# Patient Record
Sex: Female | Born: 1975 | Race: Black or African American | Hispanic: No | Marital: Single | Smoking: Never smoker
Health system: Southern US, Community
[De-identification: ages and names within clinical notes are randomized; demographics above are authoritative.]

## PROBLEM LIST (undated history)

## (undated) DIAGNOSIS — G43909 Migraine, unspecified, not intractable, without status migrainosus: Secondary | ICD-10-CM

## (undated) DIAGNOSIS — F329 Major depressive disorder, single episode, unspecified: Secondary | ICD-10-CM

## (undated) DIAGNOSIS — L509 Urticaria, unspecified: Secondary | ICD-10-CM

## (undated) DIAGNOSIS — Z789 Other specified health status: Secondary | ICD-10-CM

## (undated) DIAGNOSIS — B9689 Other specified bacterial agents as the cause of diseases classified elsewhere: Secondary | ICD-10-CM

## (undated) DIAGNOSIS — F419 Anxiety disorder, unspecified: Secondary | ICD-10-CM

## (undated) DIAGNOSIS — Z8041 Family history of malignant neoplasm of ovary: Secondary | ICD-10-CM

## (undated) DIAGNOSIS — F32A Depression, unspecified: Secondary | ICD-10-CM

## (undated) DIAGNOSIS — J45909 Unspecified asthma, uncomplicated: Secondary | ICD-10-CM

## (undated) DIAGNOSIS — N390 Urinary tract infection, site not specified: Secondary | ICD-10-CM

## (undated) DIAGNOSIS — Z800 Family history of malignant neoplasm of digestive organs: Secondary | ICD-10-CM

## (undated) DIAGNOSIS — N760 Acute vaginitis: Secondary | ICD-10-CM

## (undated) HISTORY — DX: Anxiety disorder, unspecified: F41.9

## (undated) HISTORY — DX: Depression, unspecified: F32.A

## (undated) HISTORY — DX: Urinary tract infection, site not specified: N39.0

## (undated) HISTORY — DX: Other specified bacterial agents as the cause of diseases classified elsewhere: B96.89

## (undated) HISTORY — DX: Urticaria, unspecified: L50.9

## (undated) HISTORY — DX: Migraine, unspecified, not intractable, without status migrainosus: G43.909

## (undated) HISTORY — DX: Unspecified asthma, uncomplicated: J45.909

## (undated) HISTORY — PX: TUBAL LIGATION: SHX77

## (undated) HISTORY — DX: Other specified bacterial agents as the cause of diseases classified elsewhere: N76.0

## (undated) HISTORY — DX: Major depressive disorder, single episode, unspecified: F32.9

## (undated) HISTORY — DX: Family history of malignant neoplasm of digestive organs: Z80.0

## (undated) HISTORY — PX: ABLATION: SHX5711

## (undated) HISTORY — PX: APPENDECTOMY: SHX54

---
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
## 2007-04-23 ENCOUNTER — Emergency Department: Payer: Self-pay | Admitting: Emergency Medicine

## 2010-08-13 ENCOUNTER — Ambulatory Visit: Payer: Self-pay | Admitting: Family Medicine

## 2012-01-16 ENCOUNTER — Emergency Department: Payer: Self-pay | Admitting: Emergency Medicine

## 2012-08-17 ENCOUNTER — Emergency Department: Payer: Self-pay | Admitting: Emergency Medicine

## 2012-08-17 LAB — TROPONIN I: Troponin-I: <0.02 ng/mL

## 2012-08-17 LAB — COMPREHENSIVE METABOLIC PANEL
Albumin: 3.9 g/dL (ref 3.4–5.0)
Alkaline Phosphatase: 59 U/L (ref 50–136)
Bilirubin,Total: 1.1 mg/dL — ABNORMAL HIGH (ref 0.2–1.0)
Creatinine: 0.67 mg/dL (ref 0.60–1.30)
EGFR (Non-African Amer.): >60
Osmolality: 276 (ref 275–301)
Potassium: 4.6 mmol/L (ref 3.5–5.1)
SGOT(AST): 32 U/L (ref 15–37)
Sodium: 139 mmol/L (ref 136–145)

## 2012-08-17 LAB — CBC
MCH: 31.7 pg (ref 26.0–34.0)
MCHC: 33.3 g/dL (ref 32.0–36.0)
MCV: 95 fL (ref 80–100)
Platelet: 231 10*3/uL (ref 150–440)
RDW: 13.2 % (ref 11.5–14.5)
WBC: 5.8 10*3/uL (ref 3.6–11.0)

## 2012-08-17 LAB — CK TOTAL AND CKMB (NOT AT ARMC)
CK, Total: 177 U/L (ref 21–215)
CK-MB: <0.5 ng/mL — ABNORMAL LOW (ref 0.5–3.6)

## 2015-08-03 ENCOUNTER — Other Ambulatory Visit: Payer: Self-pay | Admitting: Medical

## 2015-08-03 ENCOUNTER — Ambulatory Visit
Admission: RE | Admit: 2015-08-03 | Discharge: 2015-08-03 | Disposition: A | Discharge status: Home / Self Care | Payer: BLUE CROSS/BLUE SHIELD | Source: Ambulatory Visit | Attending: Medical | Admitting: Medical

## 2015-08-03 DIAGNOSIS — R202 Paresthesia of skin: Secondary | ICD-10-CM | POA: Diagnosis present

## 2015-08-03 DIAGNOSIS — R52 Pain, unspecified: Secondary | ICD-10-CM

## 2015-08-03 DIAGNOSIS — M79605 Pain in left leg: Secondary | ICD-10-CM | POA: Insufficient documentation

## 2015-08-03 DIAGNOSIS — R200 Anesthesia of skin: Secondary | ICD-10-CM

## 2015-08-09 ENCOUNTER — Other Ambulatory Visit: Payer: Self-pay | Admitting: Medical

## 2015-08-09 DIAGNOSIS — M5432 Sciatica, left side: Secondary | ICD-10-CM

## 2015-08-09 DIAGNOSIS — R200 Anesthesia of skin: Secondary | ICD-10-CM

## 2015-08-09 DIAGNOSIS — R202 Paresthesia of skin: Secondary | ICD-10-CM

## 2015-08-11 ENCOUNTER — Emergency Department: Payer: BLUE CROSS/BLUE SHIELD

## 2015-08-11 ENCOUNTER — Emergency Department
Admission: EM | Admit: 2015-08-11 | Discharge: 2015-08-11 | Disposition: A | Discharge status: Home / Self Care | Payer: BLUE CROSS/BLUE SHIELD | Attending: Emergency Medicine | Admitting: Emergency Medicine

## 2015-08-11 ENCOUNTER — Encounter: Payer: Self-pay | Admitting: Emergency Medicine

## 2015-08-11 DIAGNOSIS — R1032 Left lower quadrant pain: Secondary | ICD-10-CM | POA: Insufficient documentation

## 2015-08-11 DIAGNOSIS — R109 Unspecified abdominal pain: Secondary | ICD-10-CM

## 2015-08-11 DIAGNOSIS — R1033 Periumbilical pain: Secondary | ICD-10-CM | POA: Diagnosis not present

## 2015-08-11 LAB — URINALYSIS COMPLETE WITH MICROSCOPIC (ARMC ONLY)
BILIRUBIN URINE: NEGATIVE
Bacteria, UA: NONE SEEN
GLUCOSE, UA: NEGATIVE mg/dL
HGB URINE DIPSTICK: NEGATIVE
Ketones, ur: NEGATIVE mg/dL
Leukocytes, UA: NEGATIVE
NITRITE: NEGATIVE
Protein, ur: NEGATIVE mg/dL
SPECIFIC GRAVITY, URINE: 1.02 (ref 1.005–1.030)
pH: 7 (ref 5.0–8.0)

## 2015-08-11 LAB — COMPREHENSIVE METABOLIC PANEL
ALBUMIN: 3.8 g/dL (ref 3.5–5.0)
ALK PHOS: 49 U/L (ref 38–126)
ALT: 18 U/L (ref 14–54)
AST: 23 U/L (ref 15–41)
Anion gap: 4 — ABNORMAL LOW (ref 5–15)
BILIRUBIN TOTAL: 0.9 mg/dL (ref 0.3–1.2)
BUN: 13 mg/dL (ref 6–20)
CALCIUM: 8.8 mg/dL — AB (ref 8.9–10.3)
CO2: 28 mmol/L (ref 22–32)
CREATININE: 0.68 mg/dL (ref 0.44–1.00)
Chloride: 106 mmol/L (ref 101–111)
GFR calc Af Amer: >60 mL/min (ref >60)
GFR calc non Af Amer: >60 mL/min (ref >60)
GLUCOSE: 87 mg/dL (ref 65–99)
Potassium: 4.5 mmol/L (ref 3.5–5.1)
Sodium: 138 mmol/L (ref 135–145)
TOTAL PROTEIN: 7.3 g/dL (ref 6.5–8.1)

## 2015-08-11 LAB — CBC
HCT: 37.5 % (ref 35.0–47.0)
Hemoglobin: 12.5 g/dL (ref 12.0–16.0)
MCH: 31.2 pg (ref 26.0–34.0)
MCHC: 33.4 g/dL (ref 32.0–36.0)
MCV: 93.2 fL (ref 80.0–100.0)
Platelets: 233 10*3/uL (ref 150–440)
RBC: 4.02 MIL/uL (ref 3.80–5.20)
RDW: 14.3 % (ref 11.5–14.5)
WBC: 6.2 10*3/uL (ref 3.6–11.0)

## 2015-08-11 LAB — PREGNANCY, URINE: PREG TEST UR: NEGATIVE

## 2015-08-11 LAB — LIPASE, BLOOD: Lipase: 16 U/L (ref 11–51)

## 2015-08-11 MED ORDER — ONDANSETRON 4 MG PO TBDP
4.0000 mg | ORAL_TABLET | Freq: Once | ORAL | Status: AC
  Administered 2015-08-11: 4 mg via ORAL
  Filled 2015-08-11: qty 1

## 2015-08-11 MED ORDER — ONDANSETRON HCL 4 MG PO TABS: 4.0000 mg | ORAL_TABLET | Freq: Three times a day (TID) | ORAL | Status: AC | PRN

## 2015-08-11 MED ORDER — HYDROCODONE-ACETAMINOPHEN 5-325 MG PO TABS
ORAL_TABLET | ORAL | Status: AC
  Filled 2015-08-11: qty 1

## 2015-08-11 MED ORDER — HYDROCODONE-ACETAMINOPHEN 5-325 MG PO TABS
1.0000 | ORAL_TABLET | Freq: Once | ORAL | Status: AC
  Administered 2015-08-11: 1 via ORAL

## 2015-08-11 MED ORDER — HYDROCODONE-ACETAMINOPHEN 5-325 MG PO TABS: 1.0000 | ORAL_TABLET | Freq: Four times a day (QID) | ORAL | Status: DC | PRN

## 2015-08-11 NOTE — ED Provider Notes (Signed)
[facility] Emergency Department Provider Note  ____________________________________________  Time seen: Approximately 11:14 AM  I have reviewed the triage vital signs and the nursing notes.   HISTORY  Chief Complaint Abdominal Pain    HPI Cassandra Pham is a 40 y.o. female , NAD, presents to the emergency department with sudden onset periumbilical and left lower quadrant abdominal pain over the last 2 days. Notes she's had pain in this area for approximately one year but has never been this bad. Denies nausea, vomiting, diarrhea, constipation. Has not had any fever, chills, sweats. Denies any chest pain or shortness of breath. Denies changes in urinary habits. No changes in physical activity nor dietary activity. Does know she has had her appendix removed as well as tubal ligation. No other abdominal surgeries. Denies any family history of abdominal issues other than her grandmother past with pancreatic cancer. She is being treated with pantoprazole for GERD by her primary care provider.   History reviewed. No pertinent past medical history.  There are no active problems to display for this patient.   History reviewed. No pertinent past surgical history.  Current Outpatient Rx  Name  Route  Sig  Dispense  Refill  . HYDROcodone-acetaminophen (NORCO) 5-325 MG tablet   Oral   Take 1 tablet by mouth every 6 (six) hours as needed for severe pain.   10 tablet   0   . ondansetron (ZOFRAN) 4 MG tablet   Oral   Take 1 tablet (4 mg total) by mouth every 8 (eight) hours as needed for nausea or vomiting.   10 tablet   0     Allergies Review of patient's allergies indicates no known allergies.  No family history on file.  Social History Social History  Substance Use Topics  . Smoking status: Never Smoker   . Smokeless tobacco: None  . Alcohol Use: No     Review of Systems  Constitutional: No fever/chills Cardiovascular: No chest pain. Respiratory:  No shortness of breath. No wheezing.  Gastrointestinal: Positive periumbilical, left lower quadrant abdominal pain.  No nausea, vomiting.  No diarrhea.  No constipation. Genitourinary: Negative for dysuria. No hematuria. No urinary hesitancy, urgency or increased frequency. Musculoskeletal: Negative for back pain.  Skin: Negative for rash. Neurological: Negative for headaches, focal weakness or numbness. 10-point ROS otherwise negative.  ____________________________________________   PHYSICAL EXAM:  VITAL SIGNS: ED Triage Vitals  Enc Vitals Group     BP --      Pulse Rate 08/11/15 0918 90     Resp 08/11/15 0918 20     Temp 08/11/15 0918 97.7 F (36.5 C)     Temp Source 08/11/15 0918 Oral     SpO2 08/11/15 0918 97 %     Weight 08/11/15 0918 112 lb (50.803 kg)     Height 08/11/15 0918 5' (1.524 m)     Head Cir --      Peak Flow --      Pain Score 08/11/15 0920 10     Pain Loc --      Pain Edu? --      Excl. in [place]? --     Constitutional: Alert and oriented. Well appearing and in no acute distress but in pain. Eyes: Conjunctivae are normal.  Head: Atraumatic. Neck: Supple with full range of motion. Hematological/Lymphatic/Immunilogical: No cervical lymphadenopathy. Cardiovascular: Normal rate, regular rhythm. Normal S1 and S2.  Good peripheral circulation. Respiratory: Normal respiratory effort without tachypnea or retractions. Lungs CTAB. Gastrointestinal:  Tender to light palpation about the area umbilicus and left lower quadrant regions but areas are soft. All other quadrants are soft and nontender to palpation. No distention but some guarding of the periumbilical area.  Neurologic:  Normal speech and language. No gross focal neurologic deficits are appreciated.  Skin:  Skin is warm, dry and intact. No rash noted. Psychiatric: Mood and affect are normal. Speech and behavior are normal. Patient exhibits appropriate insight and  judgement.   ____________________________________________   LABS (all labs ordered are listed, but only abnormal results are displayed)  Labs Reviewed  COMPREHENSIVE METABOLIC PANEL - Abnormal; Notable for the following:    Calcium 8.8 (*)    Anion gap 4 (*)    All other components within normal limits  URINALYSIS COMPLETEWITH MICROSCOPIC ([facility] ONLY) - Abnormal; Notable for the following:    Color, Urine YELLOW (*)    APPearance CLEAR (*)    Squamous Epithelial / LPF 0-5 (*)    All other components within normal limits  LIPASE, BLOOD  CBC  PREGNANCY, URINE   ____________________________________________  EKG  None ____________________________________________  RADIOLOGY I have personally viewed and evaluated these images (plain radiographs) as part of my medical decision making, as well as reviewing the written report by the radiologist.  US Transvaginal Non-ob  08/11/2015  CLINICAL DATA:  LEFT lower quadrant pain for 1 year worse for 1 week EXAM: TRANSABDOMINAL AND TRANSVAGINAL ULTRASOUND OF PELVIS TECHNIQUE: Both transabdominal and transvaginal ultrasound examinations of the pelvis were performed. Transabdominal technique was performed for global imaging of the pelvis including uterus, ovaries, adnexal regions, and pelvic cul-de-sac. It was necessary to proceed with endovaginal exam following the transabdominal exam to visualize the uterus and ovaries. COMPARISON:  None FINDINGS: Uterus Measurements: 8.4 x 3.8 x 5.8 cm. Posterior mid uterine leiomyoma slightly turned RIGHT, 2.8 x 1.5 x 2.2 cm. No additional masses. Endometrium Thickness: 12 mm thick. No endometrial fluid or focal abnormality. Few nabothian cysts at cervix. Right ovary Measurements: 3.3 x 2.3 x 2.3 cm. Dominant follicle 1.9 cm diameter. No additional mass. Internal blood flow present on color Doppler imaging. Left ovary Measurements: 2.8 x 1.9 x 1.7 cm. Normal morphology without mass. Internal blood flow present on  color Doppler imaging. Other findings Small amount of nonspecific free pelvic fluid. Numerous parametrial vessels in LEFT adnexa, nonspecific but can be seen with pelvic congestion syndrome. IMPRESSION: Posterior mid uterine fibroid 2.8 cm greatest size. Numerous parametrial vessels in LEFT adnexa, nonspecific but can be seen with pelvic congestion syndrome. Otherwise negative exam. Electronically Signed   By: Lavonia Dana M.D.   On: 08/11/2015 12:40   US Pelvis Complete  08/11/2015  CLINICAL DATA:  LEFT lower quadrant pain for 1 year worse for 1 week EXAM: TRANSABDOMINAL AND TRANSVAGINAL ULTRASOUND OF PELVIS TECHNIQUE: Both transabdominal and transvaginal ultrasound examinations of the pelvis were performed. Transabdominal technique was performed for global imaging of the pelvis including uterus, ovaries, adnexal regions, and pelvic cul-de-sac. It was necessary to proceed with endovaginal exam following the transabdominal exam to visualize the uterus and ovaries. COMPARISON:  None FINDINGS: Uterus Measurements: 8.4 x 3.8 x 5.8 cm. Posterior mid uterine leiomyoma slightly turned RIGHT, 2.8 x 1.5 x 2.2 cm. No additional masses. Endometrium Thickness: 12 mm thick. No endometrial fluid or focal abnormality. Few nabothian cysts at cervix. Right ovary Measurements: 3.3 x 2.3 x 2.3 cm. Dominant follicle 1.9 cm diameter. No additional mass. Internal blood flow present on color Doppler imaging. Left ovary Measurements:  2.8 x 1.9 x 1.7 cm. Normal morphology without mass. Internal blood flow present on color Doppler imaging. Other findings Small amount of nonspecific free pelvic fluid. Numerous parametrial vessels in LEFT adnexa, nonspecific but can be seen with pelvic congestion syndrome. IMPRESSION: Posterior mid uterine fibroid 2.8 cm greatest size. Numerous parametrial vessels in LEFT adnexa, nonspecific but can be seen with pelvic congestion syndrome. Otherwise negative exam. Electronically Signed   By: Lavonia Dana  M.D.   On: 08/11/2015 12:40   US Abdomen Limited  08/11/2015  CLINICAL DATA:  Chronic left lower quadrant abdominal pain. EXAM: LIMITED ABDOMINAL ULTRASOUND COMPARISON:  None. FINDINGS: Sonographic evaluation was performed in the left lower quadrant of the abdomen. No definite mass or cyst is noted. IMPRESSION: No definite sonographic abnormality seen in the left lower quadrant of the abdomen. Electronically Signed   By: Marijo Conception, M.D.   On: 08/11/2015 12:36   Dg Abd Acute W/chest  08/11/2015  CLINICAL DATA:  Worsening left-sided abdominal pain beginning over 1 year ago but increased this week with feelings of syncope. Pain localized to the umbilicus and left side. EXAM: DG ABDOMEN ACUTE W/ 1V CHEST COMPARISON:  Chest x-ray dated 08/17/2012. FINDINGS: Single view of the chest: Heart size is normal. Lungs are clear. Lung volumes are normal. Osseous structures about the chest are unremarkable. Supine and upright views of the abdomen: Bowel gas pattern is nonobstructive. No evidence of soft tissue mass or abnormal fluid collection. No evidence of free intraperitoneal air. No pathologic- appearing calcifications. Osseous structures are unremarkable. IMPRESSION: 1. Lungs are clear and there is no evidence of acute cardiopulmonary abnormality. 2. Nonobstructive bowel gas pattern and no evidence of acute intra-abdominal abnormality. Electronically Signed   By: Franki Cabot M.D.   On: 08/11/2015 12:58    ____________________________________________    PROCEDURES  Procedure(s) performed: None   Medications  HYDROcodone-acetaminophen (NORCO/VICODIN) 5-325 MG per tablet 1 tablet (1 tablet Oral Given 08/11/15 1120)  ondansetron (ZOFRAN-ODT) disintegrating tablet 4 mg (4 mg Oral Given 08/11/15 1321)   Patient notes improvement of abdominal pain, but had some nausea begin. Zofran was given prior to discharge.   ____________________________________________   INITIAL IMPRESSION / ASSESSMENT AND PLAN  / ED COURSE  Pertinent labs & imaging results that were available during my care of the patient were reviewed by me and considered in my medical decision making (see chart for details).  Patient's diagnosis is consistent with generalized abdominal pain. Labs and imaging without significant abnormalities with stable, normal vital signs is reassuring. Patient will be discharged home with prescriptions for Norco and Zofran to take as directed. Patient is to follow up with Dr. Candace Cruise in gastroenterology as soon as possible to continue evaluation and treatment.  Patient is given ED precautions to return to the ED for any worsening or new symptoms.    ____________________________________________  FINAL CLINICAL IMPRESSION(S) / ED DIAGNOSES  Final diagnoses:  Abdominal pain, unspecified abdominal location      NEW MEDICATIONS STARTED DURING THIS VISIT:  New Prescriptions   HYDROCODONE-ACETAMINOPHEN (NORCO) 5-325 MG TABLET    Take 1 tablet by mouth every 6 (six) hours as needed for severe pain.   ONDANSETRON (ZOFRAN) 4 MG TABLET    Take 1 tablet (4 mg total) by mouth every 8 (eight) hours as needed for nausea or vomiting.         Braxton Feathers, PA-C 08/11/15 [place], MD 08/11/15 1328

## 2015-08-11 NOTE — ED Notes (Signed)
Pt presents with llq pain for about one year now.

## 2015-08-11 NOTE — Discharge Instructions (Signed)

## 2015-08-11 NOTE — ED Notes (Signed)
Patient transported to Ultrasound 

## 2015-08-17 ENCOUNTER — Other Ambulatory Visit: Payer: BLUE CROSS/BLUE SHIELD

## 2015-08-22 ENCOUNTER — Ambulatory Visit
Admission: RE | Admit: 2015-08-22 | Discharge: 2015-08-22 | Disposition: A | Discharge status: Home / Self Care | Payer: BLUE CROSS/BLUE SHIELD | Source: Ambulatory Visit | Attending: Medical | Admitting: Medical

## 2015-08-22 DIAGNOSIS — R202 Paresthesia of skin: Secondary | ICD-10-CM

## 2015-08-22 DIAGNOSIS — M5432 Sciatica, left side: Secondary | ICD-10-CM

## 2015-08-22 DIAGNOSIS — R200 Anesthesia of skin: Secondary | ICD-10-CM

## 2015-10-05 ENCOUNTER — Ambulatory Visit: Payer: BLUE CROSS/BLUE SHIELD | Admitting: Certified Registered"

## 2015-10-05 ENCOUNTER — Encounter: Payer: Self-pay | Admitting: *Deleted

## 2015-10-05 ENCOUNTER — Encounter
Admission: RE | Disposition: A | Discharge status: Home / Self Care | Payer: Self-pay | Source: Ambulatory Visit | Attending: Gastroenterology

## 2015-10-05 ENCOUNTER — Ambulatory Visit
Admission: RE | Admit: 2015-10-05 | Discharge: 2015-10-05 | Disposition: A | Discharge status: Home / Self Care | Payer: BLUE CROSS/BLUE SHIELD | Source: Ambulatory Visit | Attending: Gastroenterology | Admitting: Gastroenterology

## 2015-10-05 DIAGNOSIS — R1013 Epigastric pain: Secondary | ICD-10-CM | POA: Insufficient documentation

## 2015-10-05 DIAGNOSIS — Z79891 Long term (current) use of opiate analgesic: Secondary | ICD-10-CM | POA: Insufficient documentation

## 2015-10-05 DIAGNOSIS — K3189 Other diseases of stomach and duodenum: Secondary | ICD-10-CM | POA: Diagnosis not present

## 2015-10-05 DIAGNOSIS — R109 Unspecified abdominal pain: Secondary | ICD-10-CM | POA: Diagnosis not present

## 2015-10-05 HISTORY — DX: Other specified health status: Z78.9

## 2015-10-05 HISTORY — PX: ESOPHAGOGASTRODUODENOSCOPY (EGD) WITH PROPOFOL: SHX5813

## 2015-10-05 LAB — POCT PREGNANCY, URINE: PREG TEST UR: NEGATIVE

## 2015-10-05 SURGERY — ESOPHAGOGASTRODUODENOSCOPY (EGD) WITH PROPOFOL
Anesthesia: General

## 2015-10-05 MED ORDER — SODIUM CHLORIDE 0.9 % IV SOLN
INTRAVENOUS | Status: DC
  Administered 2015-10-05: 10:00:00 via INTRAVENOUS

## 2015-10-05 MED ORDER — GLYCOPYRROLATE 0.2 MG/ML IJ SOLN
INTRAMUSCULAR | Status: DC | PRN
  Administered 2015-10-05: 0.2 mg via INTRAVENOUS

## 2015-10-05 MED ORDER — PROPOFOL 500 MG/50ML IV EMUL
INTRAVENOUS | Status: DC | PRN
  Administered 2015-10-05: 120 ug/kg/min via INTRAVENOUS

## 2015-10-05 MED ORDER — PROPOFOL 10 MG/ML IV BOLUS
INTRAVENOUS | Status: DC | PRN
  Administered 2015-10-05: 70 mg via INTRAVENOUS

## 2015-10-05 MED ORDER — LIDOCAINE HCL (CARDIAC) 20 MG/ML IV SOLN
INTRAVENOUS | Status: DC | PRN
  Administered 2015-10-05: 50 mg via INTRAVENOUS

## 2015-10-05 NOTE — Anesthesia Postprocedure Evaluation (Signed)
Anesthesia Post Note  Patient: Cassandra Pham  Procedure(s) Performed: Procedure(s) (LRB): ESOPHAGOGASTRODUODENOSCOPY (EGD) WITH PROPOFOL (N/A)  Patient location during evaluation: Endoscopy Anesthesia Type: General Level of consciousness: awake and alert Pain management: pain level controlled Vital Signs Assessment: post-procedure vital signs reviewed and stable Respiratory status: spontaneous breathing, nonlabored ventilation, respiratory function stable and patient connected to nasal cannula oxygen Cardiovascular status: blood pressure returned to baseline and stable Postop Assessment: no signs of nausea or vomiting Anesthetic complications: no    Last Vitals:  Filed Vitals:   10/05/15 1033 10/05/15 1035  BP: 90/61 99/61  Pulse: 92 90  Temp: 36.1 C 36.1 C  Resp: 16 20    Last Pain: There were no vitals filed for this visit.               Martha Clan

## 2015-10-05 NOTE — H&P (Signed)
    Primary Care Physician:  Estill Dooms RATCLIFFE, PA-C Primary Gastroenterologist:  Dr. Candace Cruise  Pre-Procedure History & Physical: HPI:  Cassandra Pham is a 40 y.o. female is here for an EGD.   Past Medical History  Diagnosis Date  . Medical history non-contributory     Past Surgical History  Procedure Laterality Date  . Tubal ligation    . Appendectomy      Prior to Admission medications   Medication Sig Start Date End Date Taking? Authorizing Provider  HYDROcodone-acetaminophen (NORCO) 5-325 MG tablet Take 1 tablet by mouth every 6 (six) hours as needed for severe pain. 08/11/15   Jami L Hagler, PA-C    Allergies as of 09/25/2015  . (No Known Allergies)    History reviewed. No pertinent family history.  Social History   Social History  . Marital Status: Married    Spouse Name: N/A  . Number of Children: N/A  . Years of Education: N/A   Occupational History  . Not on file.   Social History Main Topics  . Smoking status: Never Smoker   . Smokeless tobacco: Never Used  . Alcohol Use: No  . Drug Use: No  . Sexual Activity: Not on file   Other Topics Concern  . Not on file   Social History Narrative    Review of Systems: See HPI, otherwise negative ROS  Physical Exam: BP 103/67 mmHg  Pulse 77  Temp(Src) 97.4 F (36.3 C) (Tympanic)  Resp 18  Ht 5' (1.524 m)  Wt 112 lb (50.803 kg)  BMI 21.87 kg/m2  SpO2 100% General:   Alert,  pleasant and cooperative in NAD Head:  Normocephalic and atraumatic. Neck:  Supple; no masses or thyromegaly. Lungs:  Clear throughout to auscultation.    Heart:  Regular rate and rhythm. Abdomen:  Soft, nontender and nondistended. Normal bowel sounds, without guarding, and without rebound.   Neurologic:  Alert and  oriented x4;  grossly normal neurologically.  Impression/Plan: Cassandra Pham is here for an EGD to be performed for epigastric pain.  Risks, benefits, limitations, and alternatives regarding EGD have been reviewed  with the patient.  Questions have been answered.  All parties agreeable.   [name], Lupita Dawn, MD  10/05/2015, 10:11 AM

## 2015-10-05 NOTE — Transfer of Care (Signed)
Immediate Anesthesia Transfer of Care Note  Patient: Cassandra Pham  Procedure(s) Performed: Procedure(s): ESOPHAGOGASTRODUODENOSCOPY (EGD) WITH PROPOFOL (N/A)  Patient Location: Endoscopy Unit  Anesthesia Type:General  Level of Consciousness: awake  Airway & Oxygen Therapy: Patient Spontanous Breathing and Patient connected to face mask oxygen  Post-op Assessment: Report given to RN  Post vital signs: Reviewed  Last Vitals:  Filed Vitals:   10/05/15 0959 10/05/15 1035  BP: 103/67 99/61  Pulse: 77 90  Temp: 36.3 C 36.1 C  Resp: 18 20    Last Pain: There were no vitals filed for this visit.       Complications: No apparent anesthesia complications

## 2015-10-05 NOTE — Op Note (Signed)
[facility] Gastroenterology Patient Name: Cassandra Pham Procedure Date: 10/05/2015 10:22 AM MRN: [id number] Account #: [id number] Date of Birth: 1975-09-08 Admit Type: Outpatient Age: 40 Room: [facility] ENDO ROOM 3 Gender: Female Note Status: Finalized Procedure:            Upper GI endoscopy Indications:          Epigastric abdominal pain Providers:            Lupita Dawn. Candace Cruise, MD Referring MD:         Estill Dooms Lily Peer, MD (Referring MD) Medicines:            Monitored Anesthesia Care Complications:        No immediate complications. Procedure:            Pre-Anesthesia Assessment:                       - Prior to the procedure, a History and Physical was                        performed, and patient medications, allergies and                        sensitivities were reviewed. The patient's tolerance of                        previous anesthesia was reviewed.                       - The risks and benefits of the procedure and the                        sedation options and risks were discussed with the                        patient. All questions were answered and informed                        consent was obtained.                       - After reviewing the risks and benefits, the patient                        was deemed in satisfactory condition to undergo the                        procedure.                       After obtaining informed consent, the endoscope was                        passed under direct vision. Throughout the procedure,                        the patient's blood pressure, pulse, and oxygen                        saturations were monitored continuously. The Endoscope  was introduced through the mouth, and advanced to the                        second part of duodenum. The upper GI endoscopy was                        accomplished without difficulty. The patient tolerated                        the procedure  well. Findings:      The examined esophagus was normal.      The entire examined stomach was normal.      The examined duodenum was normal. Impression:           - Normal esophagus.                       - Normal stomach.                       - Normal examined duodenum.                       - No specimens collected. Recommendation:       - Discharge patient to home.                       - Observe patient's clinical course.                       - Continue present medications.                       - Await pathology results.                       - The findings and recommendations were discussed with                        the patient. Procedure Code(s):    --- Professional ---                       [phone], Esophagogastroduodenoscopy, flexible, transoral;                        diagnostic, including collection of specimen(s) by                        brushing or washing, when performed (separate procedure) Diagnosis Code(s):    --- Professional ---                       R10.13, Epigastric pain CPT copyright 2016 American Medical Association. All rights reserved. The codes documented in this report are preliminary and upon coder review may  be revised to meet current compliance requirements. Hulen Luster, MD 10/05/2015 10:31:33 AM This report has been signed electronically. Number of Addenda: 0 Note Initiated On: 10/05/2015 10:22 AM      [facility]

## 2015-10-05 NOTE — Anesthesia Preprocedure Evaluation (Signed)
Anesthesia Evaluation  Patient identified by MRN, date of birth, ID band Patient awake    Reviewed: Allergy & Precautions, H&P , NPO status , Patient's Chart, lab work & pertinent test results, reviewed documented beta blocker date and time   History of Anesthesia Complications Negative for: history of anesthetic complications  Airway Mallampati: I  TM Distance: >3 FB Neck ROM: full    Dental no notable dental hx. (+) Teeth Intact, Missing   Pulmonary neg pulmonary ROS,    Pulmonary exam normal breath sounds clear to auscultation       Cardiovascular Exercise Tolerance: Good negative cardio ROS Normal cardiovascular exam Rhythm:regular Rate:Normal     Neuro/Psych negative neurological ROS  negative psych ROS   GI/Hepatic negative GI ROS, Neg liver ROS,   Endo/Other  negative endocrine ROS  Renal/GU negative Renal ROS  negative genitourinary   Musculoskeletal   Abdominal   Peds  Hematology negative hematology ROS (+)   Anesthesia Other Findings Past Medical History:   Medical history non-contributory                             Reproductive/Obstetrics negative OB ROS                             Anesthesia Physical Anesthesia Plan  ASA: I  Anesthesia Plan: General   Post-op Pain Management:    Induction:   Airway Management Planned:   Additional Equipment:   Intra-op Plan:   Post-operative Plan:   Informed Consent: I have reviewed the patients History and Physical, chart, labs and discussed the procedure including the risks, benefits and alternatives for the proposed anesthesia with the patient or authorized representative who has indicated his/her understanding and acceptance.   Dental Advisory Given  Plan Discussed with: Anesthesiologist, CRNA and Surgeon  Anesthesia Plan Comments:         Anesthesia Quick Evaluation

## 2015-10-06 LAB — SURGICAL PATHOLOGY

## 2015-10-07 ENCOUNTER — Encounter: Payer: Self-pay | Admitting: Gastroenterology

## 2016-02-05 DIAGNOSIS — Z1151 Encounter for screening for human papillomavirus (HPV): Secondary | ICD-10-CM | POA: Diagnosis not present

## 2016-02-05 DIAGNOSIS — Z1239 Encounter for other screening for malignant neoplasm of breast: Secondary | ICD-10-CM | POA: Diagnosis not present

## 2016-02-05 DIAGNOSIS — Z124 Encounter for screening for malignant neoplasm of cervix: Secondary | ICD-10-CM | POA: Diagnosis not present

## 2016-02-05 DIAGNOSIS — Z01419 Encounter for gynecological examination (general) (routine) without abnormal findings: Secondary | ICD-10-CM | POA: Diagnosis not present

## 2016-02-12 DIAGNOSIS — J029 Acute pharyngitis, unspecified: Secondary | ICD-10-CM | POA: Diagnosis not present

## 2016-03-01 DIAGNOSIS — N390 Urinary tract infection, site not specified: Secondary | ICD-10-CM | POA: Diagnosis not present

## 2016-03-01 DIAGNOSIS — S060X0A Concussion without loss of consciousness, initial encounter: Secondary | ICD-10-CM | POA: Diagnosis not present

## 2016-09-04 ENCOUNTER — Other Ambulatory Visit: Payer: Self-pay | Admitting: Obstetrics and Gynecology

## 2016-09-04 DIAGNOSIS — Z1231 Encounter for screening mammogram for malignant neoplasm of breast: Secondary | ICD-10-CM

## 2016-09-09 ENCOUNTER — Other Ambulatory Visit: Payer: Self-pay | Admitting: Obstetrics and Gynecology

## 2016-09-09 DIAGNOSIS — Z1231 Encounter for screening mammogram for malignant neoplasm of breast: Secondary | ICD-10-CM

## 2016-09-11 ENCOUNTER — Ambulatory Visit
Admission: RE | Admit: 2016-09-11 | Discharge: 2016-09-11 | Disposition: A | Discharge status: Home / Self Care | Payer: BLUE CROSS/BLUE SHIELD | Source: Ambulatory Visit | Attending: Obstetrics and Gynecology | Admitting: Obstetrics and Gynecology

## 2016-09-11 DIAGNOSIS — Z1231 Encounter for screening mammogram for malignant neoplasm of breast: Secondary | ICD-10-CM | POA: Insufficient documentation

## 2016-09-12 ENCOUNTER — Encounter: Payer: Self-pay | Admitting: Obstetrics and Gynecology

## 2016-09-17 ENCOUNTER — Ambulatory Visit (INDEPENDENT_AMBULATORY_CARE_PROVIDER_SITE_OTHER): Payer: BLUE CROSS/BLUE SHIELD | Admitting: Obstetrics and Gynecology

## 2016-09-17 ENCOUNTER — Encounter: Payer: Self-pay | Admitting: Obstetrics and Gynecology

## 2016-09-17 VITALS — BP 108/68 | HR 79 | Ht 60.0 in | Wt 114.0 lb

## 2016-09-17 DIAGNOSIS — N898 Other specified noninflammatory disorders of vagina: Secondary | ICD-10-CM

## 2016-09-17 DIAGNOSIS — B9689 Other specified bacterial agents as the cause of diseases classified elsewhere: Secondary | ICD-10-CM | POA: Diagnosis not present

## 2016-09-17 DIAGNOSIS — N760 Acute vaginitis: Secondary | ICD-10-CM

## 2016-09-17 LAB — POCT WET PREP WITH KOH
Clue Cells Wet Prep HPF POC: POSITIVE
KOH PREP POC: POSITIVE — AB
TRICHOMONAS UA: NEGATIVE
Yeast Wet Prep HPF POC: NEGATIVE

## 2016-09-17 MED ORDER — METRONIDAZOLE 0.75 % EX GEL: CUTANEOUS | 0 refills | Status: DC

## 2016-09-17 MED ORDER — METRONIDAZOLE 500 MG PO TABS: 500.0000 mg | ORAL_TABLET | Freq: Two times a day (BID) | ORAL | 0 refills | Status: DC

## 2016-09-17 NOTE — Progress Notes (Signed)
Chief Complaint  Patient presents with  . Vaginal Discharge    HPI:      [name] is a 41 y.o. [id number] who LMP was Patient's last menstrual period was 09/06/2016., presents today for vaginal d/c with odor, no irritation for the past 2-3 months. She has a hx of BV in the past, but when she last had sx 9/17, wet prep was neg and pt didn't have odor. She has also noted some mild LBP. No fevers, chills, belly pain. No new partners.    Review of Systems  Constitutional: Negative for fever.  Gastrointestinal: Negative for blood in stool, constipation, diarrhea, nausea and vomiting.  Genitourinary: Positive for vaginal discharge. Negative for dyspareunia, dysuria, flank pain, frequency, hematuria, urgency, vaginal bleeding and vaginal pain.  Musculoskeletal: Negative for back pain.  Skin: Negative for rash.    There are no active problems to display for this patient.   Family History  Problem Relation Age of Onset  . Hypertension Mother   . Diabetes Father   . Pancreatic cancer Maternal Grandmother 98  . Breast cancer Neg Hx     Social History   Social History  . Marital status: Married    Spouse name: N/A  . Number of children: N/A  . Years of education: N/A   Occupational History  . Not on file.   Social History Main Topics  . Smoking status: Never Smoker  . Smokeless tobacco: Never Used  . Alcohol use No  . Drug use: No  . Sexual activity: Yes    Birth control/ protection: Surgical   Other Topics Concern  . Not on file   Social History Narrative  . No narrative on file     Current Outpatient Prescriptions:  .  Cholecalciferol (VITAMIN D-1000 MAX ST) 1000 units tablet, Take by mouth., Disp: , Rfl:  .  metroNIDAZOLE (METROGEL) 5.05 % gel, 1 applicatorful inserted vaginally nightly for 5 nights, Disp: 45 g, Rfl: 0  OBJECTIVE:   Vitals:  BP 108/68   Pulse 79   Ht 5' (1.524 m)   Wt 114 lb (51.7 kg)   LMP 09/06/2016   BMI 22.26 kg/m   Physical  Exam  Constitutional: She is oriented to person, place, and time and well-developed, well-nourished, and in no distress. Vital signs are normal.  Genitourinary: Uterus normal, cervix normal, right adnexa normal, left adnexa normal and vulva normal. Uterus is not enlarged. Cervix exhibits no motion tenderness and no tenderness. Right adnexum displays no mass and no tenderness. Left adnexum displays no mass and no tenderness. Vulva exhibits no erythema, no exudate, no lesion, no rash and no tenderness. Vagina exhibits no lesion. Creamy  white and vaginal discharge found.  Neurological: She is oriented to person, place, and time.  Vitals reviewed.   Results: Results for orders placed or performed in visit on 09/17/16  POCT Wet Prep with KOH  Result Value Ref Range   Trichomonas, UA Negative    Clue Cells Wet Prep HPF POC pos    Epithelial Wet Prep HPF POC  Few, Moderate, Many, Too numerous to count   Yeast Wet Prep HPF POC neg    Bacteria Wet Prep HPF POC  Few   RBC Wet Prep HPF POC     WBC Wet Prep HPF POC     KOH Prep POC Positive (A) Negative      Assessment/Plan: Bacterial vaginosis - Rx metrogel (pt pref--not pills). Will RF if sx recur. F/u prn. -  Plan: POCT Wet Prep with KOH, metroNIDAZOLE (METROGEL) 0.75 % gel, DISCONTINUED: metroNIDAZOLE (FLAGYL) 500 MG tablet  Vaginal discharge - Plan: POCT Wet Prep with KOH, DISCONTINUED: metroNIDAZOLE (FLAGYL) 500 MG tablet   Return if symptoms worsen or fail to improve.  Alicia B. Copland, PA-C 09/17/2016 2:12 PM

## 2016-09-20 ENCOUNTER — Encounter: Payer: Self-pay | Admitting: Medical

## 2016-09-20 ENCOUNTER — Ambulatory Visit: Payer: BLUE CROSS/BLUE SHIELD | Admitting: Medical

## 2016-09-20 VITALS — BP 118/74 | HR 96 | Temp 97.9°F | Resp 16

## 2016-09-20 DIAGNOSIS — N3001 Acute cystitis with hematuria: Secondary | ICD-10-CM

## 2016-09-20 LAB — POCT URINALYSIS DIPSTICK
Bilirubin, UA: NEGATIVE
Glucose, UA: NEGATIVE
KETONES UA: NEGATIVE
Nitrite, UA: NEGATIVE
PH UA: 6 (ref 5.0–8.0)
PROTEIN UA: NEGATIVE
SPEC GRAV UA: 1.02 (ref 1.010–1.025)
UROBILINOGEN UA: NEGATIVE U/dL — AB

## 2016-09-20 MED ORDER — NITROFURANTOIN MONOHYD MACRO 100 MG PO CAPS: 100.0000 mg | ORAL_CAPSULE | Freq: Two times a day (BID) | ORAL | 0 refills | Status: DC

## 2016-09-20 NOTE — Progress Notes (Signed)
Patient reports UTI symptoms since yesterday.  States she has pressure and is using the bathroom frequently but only voids a small amount.

## 2016-09-20 NOTE — Addendum Note (Signed)
Addended by: Sharl Ma on: 09/20/2016 12:59 PM   Modules accepted: Orders

## 2016-09-20 NOTE — Patient Instructions (Signed)
Drink plenty of water. Take macrobid  Twice daily x 7 days. Use OTC AZO take as directed for discomfort. OTC Motrin or Tylenol take as directed for back pain.

## 2016-09-20 NOTE — Progress Notes (Signed)
   Subjective:    Patient ID: Cassandra Pham, female    DOB: [birth year], 41 y.o.   MRN: [id number]  HPI 41 yo female , started yesterday with  lower bilateral back pain ( usually happens with urinary tract infections) , frequency , little amounts of voiding , pain on urination, and  suprapubic pain. Currently being treated for bacterial vaginosis saw Fraser Din using  vaginal metronidazole gel.   Review of Systems  Constitutional: Negative for chills and fever.  HENT: Negative.   Eyes: Negative.   Respiratory: Negative.   Cardiovascular: Negative.   Gastrointestinal: Negative.   Endocrine: Negative.   Genitourinary: Positive for decreased urine volume, dysuria, frequency and urgency. Negative for hematuria and vaginal bleeding.  Musculoskeletal: Positive for back pain. Negative for arthralgias and neck pain.  Allergic/Immunologic: Negative for environmental allergies, food allergies and immunocompromised state.  Neurological: Negative for dizziness, syncope and light-headedness.  Hematological: Negative.   Psychiatric/Behavioral: Negative for agitation, confusion, hallucinations and self-injury.       Objective:   Physical Exam  Constitutional: She is oriented to person, place, and time. She appears well-developed and well-nourished.  Eyes: EOM are normal. Pupils are equal, round, and reactive to light.  Neck: Normal range of motion.  Abdominal: Soft. Bowel sounds are normal. There is no tenderness.  Neurological: She is alert and oriented to person, place, and time.  Skin: Skin is warm and dry.  Psychiatric: She has a normal mood and affect. Her behavior is normal.   CVA tenderness mild bilateral. ( both equally painful per patient).       Assessment & Plan:   Urinary tract infection eprescribed  macrobid 100mg  twice daily x 7 days  #14 no refills. ( she did not like the cipro pill due to its size).  Recommended OTC Azo take as directed x  2-3 daya then stop.  Given  Azo( reviewed with patient this will cause urine to turn orange.) [id number] exp 5/19 and Advil in clinic 44784 1/20 Return to clinic in 3 days if not improving.  If fever, back pain, vomiting or chills to seek out medical care at an Urgent Care over the weekend. Increase water intake.

## 2016-10-02 ENCOUNTER — Ambulatory Visit: Payer: BLUE CROSS/BLUE SHIELD | Admitting: Medical

## 2016-10-02 VITALS — BP 110/75 | HR 78 | Temp 98.3°F | Resp 16 | Ht 60.0 in | Wt 115.0 lb

## 2016-10-02 DIAGNOSIS — M10472 Other secondary gout, left ankle and foot: Secondary | ICD-10-CM

## 2016-10-02 DIAGNOSIS — M109 Gout, unspecified: Secondary | ICD-10-CM | POA: Insufficient documentation

## 2016-10-02 MED ORDER — PREDNISONE 10 MG (21) PO TBPK: ORAL_TABLET | ORAL | 0 refills | Status: DC

## 2016-10-02 NOTE — Patient Instructions (Addendum)
Prednisone taper  10mg  take  6 pill today by mouth then  5 pills tomorrow then one less everyday thereafter till gone. Take with food. Elevate foot  Return if not improving or worsening .

## 2016-10-02 NOTE — Progress Notes (Addendum)
   Subjective:    Patient ID: Cassandra Pham, female    DOB: 1975-11-29, 41 y.o.   MRN: [id number]  HPI   41 yo female started on Monday night with pain left great toe, Previous history of Gout ( last episode one year ago).  Eating shrimp plate and Fish and alcohol tequila and vodka for a birthday.Ate more shrimp both on last Wednesday and Friday. Took advil  At  11:30am  400mg  to day with no relief.  Is a Administrator for student conduct.  Review of Systems  Constitutional: Negative for chills and fever.  HENT: Negative for congestion, ear discharge and sore throat.   Eyes: Negative for discharge and itching.  Respiratory: Negative for chest tightness, shortness of breath and wheezing.   Cardiovascular: Negative for chest pain, palpitations and leg swelling.  Gastrointestinal: Negative for diarrhea, nausea and vomiting.  Endocrine: Negative for cold intolerance and heat intolerance.  Genitourinary: Positive for hematuria. Negative for dysuria.  Musculoskeletal: Negative for arthralgias and myalgias.  Allergic/Immunologic: Positive for environmental allergies and food allergies.   Pt with hematuria, on her mensis.    Objective:   Physical Exam  Constitutional: She is oriented to person, place, and time. She appears well-developed and well-nourished.  HENT:  Head: Normocephalic and atraumatic.  Eyes: EOM are normal. Pupils are equal, round, and reactive to light.  Musculoskeletal: Normal range of motion.  Neurological: She is alert and oriented to person, place, and time.  Skin: There is erythema.  Psychiatric: She has a normal mood and affect. Her behavior is normal.   Red and swollen left  First metatarsal joint , tender to palpation. Patient walking gingerly secondary to pain.       Assessment & Plan:  Gout Prednisone taper  10mg  take  6 pill today by mouth then  5 pills tomorrow then one less everyday thereafter till gone , take with food. Wear comfortable  shoes. Elevate foot , work note given to do so.  OTC Advil 600mg  every 6 hours with food for pain no more than 5-7 days. Reviewed foods that can cause gout. She though since she had not had an attack in so long that she was safe to eat shrimp. Reviewed Gout and uric acid information with patient. Return to the clinic for annual fasting labs scheduled for Tuesday 5/15/ 2018. Will also check uric acid level.

## 2016-10-04 ENCOUNTER — Other Ambulatory Visit: Payer: BLUE CROSS/BLUE SHIELD

## 2016-10-09 ENCOUNTER — Other Ambulatory Visit: Payer: BLUE CROSS/BLUE SHIELD

## 2016-10-09 DIAGNOSIS — Z0000 Encounter for general adult medical examination without abnormal findings: Secondary | ICD-10-CM

## 2016-10-10 LAB — CMP12+LP+TP+TSH+6AC+CBC/D/PLT
A/G RATIO: 1.3 (ref 1.2–2.2)
ALK PHOS: 48 IU/L (ref 39–117)
ALT: 11 IU/L (ref 0–32)
AST: 15 IU/L (ref 0–40)
Albumin: 3.9 g/dL (ref 3.5–5.5)
BASOS ABS: 0 10*3/uL (ref 0.0–0.2)
BASOS: 0 %
BILIRUBIN TOTAL: 0.5 mg/dL (ref 0.0–1.2)
BUN / CREAT RATIO: 13 (ref 9–23)
BUN: 10 mg/dL (ref 6–24)
CHOL/HDL RATIO: 4.2 ratio (ref 0.0–4.4)
CREATININE: 0.78 mg/dL (ref 0.57–1.00)
Calcium: 8.8 mg/dL (ref 8.7–10.2)
Chloride: 103 mmol/L (ref 96–106)
Cholesterol, Total: 198 mg/dL (ref 100–199)
EOS (ABSOLUTE): 0.4 10*3/uL (ref 0.0–0.4)
EOS: 9 %
ESTIMATED CHD RISK: 1 times avg. (ref 0.0–1.0)
Free Thyroxine Index: 1.9 (ref 1.2–4.9)
GFR, EST AFRICAN AMERICAN: 108 mL/min/{1.73_m2} (ref >59)
GFR, EST NON AFRICAN AMERICAN: 93 mL/min/{1.73_m2} (ref >59)
GGT: 34 IU/L (ref 0–60)
GLUCOSE: 86 mg/dL (ref 65–99)
Globulin, Total: 2.9 g/dL (ref 1.5–4.5)
HDL: 47 mg/dL (ref >39)
HEMATOCRIT: 37.3 % (ref 34.0–46.6)
HEMOGLOBIN: 12.1 g/dL (ref 11.1–15.9)
IMMATURE GRANULOCYTES: 0 %
IRON: 63 ug/dL (ref 27–159)
Immature Grans (Abs): 0 10*3/uL (ref 0.0–0.1)
LDH: 145 IU/L (ref 119–226)
LDL CALC: 138 mg/dL — AB (ref 0–99)
LYMPHS ABS: 1.8 10*3/uL (ref 0.7–3.1)
Lymphs: 40 %
MCH: 30.2 pg (ref 26.6–33.0)
MCHC: 32.4 g/dL (ref 31.5–35.7)
MCV: 93 fL (ref 79–97)
MONOCYTES: 7 %
Monocytes Absolute: 0.3 10*3/uL (ref 0.1–0.9)
Neutrophils Absolute: 2 10*3/uL (ref 1.4–7.0)
Neutrophils: 44 %
PLATELETS: 302 10*3/uL (ref 150–379)
Phosphorus: 3.1 mg/dL (ref 2.5–4.5)
Potassium: 4.3 mmol/L (ref 3.5–5.2)
RBC: 4.01 x10E6/uL (ref 3.77–5.28)
RDW: 14 % (ref 12.3–15.4)
SODIUM: 140 mmol/L (ref 134–144)
T3 UPTAKE RATIO: 27 % (ref 24–39)
T4, Total: 7.2 ug/dL (ref 4.5–12.0)
TOTAL PROTEIN: 6.8 g/dL (ref 6.0–8.5)
TSH: 1.89 u[IU]/mL (ref 0.450–4.500)
Triglycerides: 65 mg/dL (ref 0–149)
URIC ACID: 3.7 mg/dL (ref 2.5–7.1)
VLDL CHOLESTEROL CAL: 13 mg/dL (ref 5–40)
WBC: 4.6 10*3/uL (ref 3.4–10.8)

## 2016-10-10 LAB — VITAMIN D 25 HYDROXY (VIT D DEFICIENCY, FRACTURES): VIT D 25 HYDROXY: 33.3 ng/mL (ref 30.0–100.0)

## 2016-11-29 ENCOUNTER — Ambulatory Visit: Payer: BLUE CROSS/BLUE SHIELD | Admitting: Medical

## 2016-11-29 VITALS — BP 90/65 | HR 88 | Temp 98.1°F | Resp 16 | Ht 60.0 in | Wt 114.0 lb

## 2016-11-29 DIAGNOSIS — R5383 Other fatigue: Secondary | ICD-10-CM

## 2016-11-29 DIAGNOSIS — R319 Hematuria, unspecified: Principal | ICD-10-CM

## 2016-11-29 DIAGNOSIS — R300 Dysuria: Secondary | ICD-10-CM

## 2016-11-29 DIAGNOSIS — N390 Urinary tract infection, site not specified: Secondary | ICD-10-CM

## 2016-11-29 LAB — POCT URINALYSIS DIPSTICK
BILIRUBIN UA: NEGATIVE
GLUCOSE UA: NEGATIVE
Ketones, UA: NEGATIVE
NITRITE UA: NEGATIVE
Protein, UA: NEGATIVE
Spec Grav, UA: 1.02 (ref 1.010–1.025)
Urobilinogen, UA: 0.2 E.U./dL
pH, UA: 6 (ref 5.0–8.0)

## 2016-11-29 MED ORDER — CEPHALEXIN 500 MG PO CAPS: 500.0000 mg | ORAL_CAPSULE | Freq: Three times a day (TID) | ORAL | 0 refills | Status: DC

## 2016-11-29 NOTE — Progress Notes (Signed)
   Subjective:    Patient ID: Cassandra Pham, female    DOB: [birth year], 41 y.o.   MRN: [id number]  HPI 41 yo female started this morning,with pressure in bladder, frequency, and urgency. Thinks she may have a urinary tract infection. Patient also complains of fatigue would like her Vitamin B12 and folate checked.   Review of Systems  Constitutional: Negative for chills and fever.  HENT: Negative for congestion, ear pain and sore throat.   Eyes: Negative for discharge and itching.  Respiratory: Negative for cough.   Cardiovascular: Negative for chest pain.  Gastrointestinal: Negative for abdominal pain.  Genitourinary: Positive for dysuria, frequency and urgency. Negative for hematuria.  Musculoskeletal: Negative for back pain.  Skin: Negative for rash.  Neurological: Negative for dizziness and syncope.  Hematological: Negative for adenopathy.  Psychiatric/Behavioral: Negative for agitation and behavioral problems. The patient is not nervous/anxious.        Objective:   Physical Exam  Constitutional: She is oriented to person, place, and time. She appears well-developed and well-nourished.  HENT:  Head: Normocephalic and atraumatic.  Eyes: Conjunctivae and EOM are normal. Pupils are equal, round, and reactive to light.  Neck: Normal range of motion.  Abdominal: Soft. Bowel sounds are normal. She exhibits no distension. There is no tenderness.  Neurological: She is alert and oriented to person, place, and time.  Skin: Skin is warm and dry.  Psychiatric: She has a normal mood and affect. Her behavior is normal. Judgment and thought content normal.  Nursing note and vitals reviewed.  No CVA tenderness   Urine dips leukoesterace and trace blood.    Assessment & Plan:  Urinary tract infection e-prescribed Keflex 500mg  one by mouth three times a day for  Seven days #21  no refills. Paitent   would like keflex due to the size of a the other pills. Daughter recently treated for UTI  with Keflex. Will start patient on Keflex and  Sent urine off for culture. Drink plenty of water. Return to clinic in  3-5 days if symptoms not improving. Will contact patient with results of culture once received. Verbalizes understanding no questions at discharge. May use OTC AZO take as directed  for bladder spasms, reviewed with patient it will cause urine to be orange.  Reviewed hygiene of wiping front to back and urinating after sexual intercourse.

## 2016-11-30 LAB — B12 AND FOLATE PANEL
FOLATE: 17.6 ng/mL (ref >3.0)
Vitamin B-12: 598 pg/mL (ref 232–1245)

## 2016-12-02 ENCOUNTER — Telehealth: Payer: Self-pay | Admitting: Medical

## 2016-12-02 LAB — URINE CULTURE

## 2016-12-02 NOTE — Telephone Encounter (Signed)
Called patient , she is feeling better. She is on Keflex and it is sensitive to the bacteria Klebsiella pneumoniae. She is to return to the clinic if she does not continue to improve. She says her pain is much better than it was on Friday.

## 2017-02-06 ENCOUNTER — Ambulatory Visit: Payer: BLUE CROSS/BLUE SHIELD | Admitting: Obstetrics and Gynecology

## 2017-02-25 ENCOUNTER — Encounter: Payer: Self-pay | Admitting: Obstetrics and Gynecology

## 2017-02-25 ENCOUNTER — Ambulatory Visit (INDEPENDENT_AMBULATORY_CARE_PROVIDER_SITE_OTHER): Payer: BLUE CROSS/BLUE SHIELD | Admitting: Obstetrics and Gynecology

## 2017-02-25 VITALS — BP 90/60 | HR 80 | Ht 62.0 in | Wt 115.0 lb

## 2017-02-25 DIAGNOSIS — Z01419 Encounter for gynecological examination (general) (routine) without abnormal findings: Secondary | ICD-10-CM

## 2017-02-25 DIAGNOSIS — R600 Localized edema: Secondary | ICD-10-CM

## 2017-02-25 DIAGNOSIS — Z1231 Encounter for screening mammogram for malignant neoplasm of breast: Secondary | ICD-10-CM | POA: Diagnosis not present

## 2017-02-25 DIAGNOSIS — Z8041 Family history of malignant neoplasm of ovary: Secondary | ICD-10-CM

## 2017-02-25 DIAGNOSIS — B9689 Other specified bacterial agents as the cause of diseases classified elsewhere: Secondary | ICD-10-CM

## 2017-02-25 DIAGNOSIS — N760 Acute vaginitis: Secondary | ICD-10-CM | POA: Diagnosis not present

## 2017-02-25 DIAGNOSIS — Z1239 Encounter for other screening for malignant neoplasm of breast: Secondary | ICD-10-CM

## 2017-02-25 LAB — POCT WET PREP WITH KOH
Clue Cells Wet Prep HPF POC: POSITIVE
KOH PREP POC: POSITIVE — AB
Trichomonas, UA: NEGATIVE
Yeast Wet Prep HPF POC: NEGATIVE

## 2017-02-25 MED ORDER — SECNIDAZOLE 2 G PO PACK: 2.0000 g | PACK | Freq: Once | ORAL | 0 refills | Status: AC

## 2017-02-25 NOTE — Progress Notes (Signed)
PCP:  Chad Cordial, PA-C   Chief Complaint  Patient presents with  . Gynecologic Exam     HPI:      [name] is a 41 y.o. [id number] who LMP was Patient's last menstrual period was 02/02/2017., presents today for her annual examination.  Her menses are regular every 28-30 days, lasting 5 days.  Dysmenorrhea mild, occurring first 1-2 days of flow. She does not have intermenstrual bleeding.  Sex activity: single partner, contraception - tubal ligation.  Last Pap: 02/05/16  Results were: no abnormalities /neg HPV DNA  Hx of STDs: none  She complains of increased d/c and odor without irritation for the past few days. She has a hx of BV, most recently treated with metrogel 4/18 with temp relief.   Last mammogram: September 11, 2016  Results were: normal--routine follow-up in 12 months There is no FH of breast cancer. There is a FH of ovarian cancer in her mat aunt and pancreatic cancer in her MGM, genetic testing not done. The patient does do self-breast exams.  Tobacco use: The patient denies current or previous tobacco use. Alcohol use: social drinker No drug use.  Exercise: not active  She does get adequate calcium and Vitamin D in her diet.  She is working at [employer] now and notices swelling in both ankles with foot pain after an 8 hr shift. She is fine if she works less or with her normal activities. She has tried Dr. Zoe Lan and wearing sneakers without relief. She may need note for work to only work 4-6 hr shifts.    Past Medical History:  Diagnosis Date  . Bacterial vaginosis   . Medical history non-contributory   . Migraine     Past Surgical History:  Procedure Laterality Date  . APPENDECTOMY    . ESOPHAGOGASTRODUODENOSCOPY (EGD) WITH PROPOFOL N/A 10/05/2015   Procedure: ESOPHAGOGASTRODUODENOSCOPY (EGD) WITH PROPOFOL;  Surgeon: Hulen Luster, MD;  Location: [facility] ENDOSCOPY;  Service: Gastroenterology;  Laterality: N/A;  . TUBAL LIGATION      Family History    Problem Relation Age of Onset  . Hypertension Mother   . Diabetes Father   . Pancreatic cancer Maternal Grandmother 9  . Ovarian cancer Maternal Aunt 3  . Breast cancer Neg Hx     Social History   Social History  . Marital status: Married    Spouse name: N/A  . Number of children: N/A  . Years of education: N/A   Occupational History  . Not on file.   Social History Main Topics  . Smoking status: Never Smoker  . Smokeless tobacco: Never Used  . Alcohol use No  . Drug use: No  . Sexual activity: Yes    Birth control/ protection: Surgical   Other Topics Concern  . Not on file   Social History Narrative  . No narrative on file    No outpatient prescriptions have been marked as taking for the 02/25/17 encounter (Office Visit) with Blimi Godby, Deirdre Evener, PA-C.     ROS:  Review of Systems  Constitutional: Negative for fatigue, fever and unexpected weight change.  Respiratory: Negative for cough, shortness of breath and wheezing.   Cardiovascular: Negative for chest pain, palpitations and leg swelling.  Gastrointestinal: Negative for blood in stool, constipation, diarrhea, nausea and vomiting.  Endocrine: Negative for cold intolerance, heat intolerance and polyuria.  Genitourinary: Positive for vaginal discharge. Negative for dyspareunia, dysuria, flank pain, frequency, genital sores, hematuria, menstrual problem, pelvic pain, urgency,  vaginal bleeding and vaginal pain.  Musculoskeletal: Negative for back pain, joint swelling and myalgias.  Skin: Negative for rash.  Neurological: Negative for dizziness, syncope, light-headedness, numbness and headaches.  Hematological: Negative for adenopathy.  Psychiatric/Behavioral: Negative for agitation, confusion, sleep disturbance and suicidal ideas. The patient is not nervous/anxious.      Objective: BP 90/60   Pulse 80   Ht 5\' 2"  (1.575 m)   Wt 115 lb (52.2 kg)   LMP 02/02/2017   BMI 21.03 kg/m    Physical Exam   Constitutional: She is oriented to person, place, and time. She appears well-developed and well-nourished.  Genitourinary: Vagina normal and uterus normal. There is no rash or tenderness on the right labia. There is no rash or tenderness on the left labia. No erythema or tenderness in the vagina. No vaginal discharge found. Right adnexum does not display mass and does not display tenderness. Left adnexum does not display mass and does not display tenderness. Cervix does not exhibit motion tenderness or polyp. Uterus is not enlarged or tender.  Neck: Normal range of motion. No thyromegaly present.  Cardiovascular: Normal rate, regular rhythm and normal heart sounds.   No murmur heard. Pulmonary/Chest: Effort normal and breath sounds normal. Right breast exhibits no mass, no nipple discharge, no skin change and no tenderness. Left breast exhibits no mass, no nipple discharge, no skin change and no tenderness.  Abdominal: Soft. There is no tenderness. There is no guarding.  Musculoskeletal: Normal range of motion.  Neurological: She is alert and oriented to person, place, and time. No cranial nerve deficit.  Psychiatric: She has a normal mood and affect. Her behavior is normal.  Vitals reviewed.   Results: Results for orders placed or performed in visit on 02/25/17 (from the past 24 hour(s))  POCT Wet Prep with KOH     Status: Abnormal   Collection Time: 02/25/17 11:35 AM  Result Value Ref Range   Trichomonas, UA Negative    Clue Cells Wet Prep HPF POC pos    Epithelial Wet Prep HPF POC  Few, Moderate, Many, Too numerous to count   Yeast Wet Prep HPF POC neg    Bacteria Wet Prep HPF POC  Few   RBC Wet Prep HPF POC     WBC Wet Prep HPF POC     KOH Prep POC Positive (A) Negative    Assessment/Plan: Encounter for annual routine gynecological examination  Screening for breast cancer - Pt to sched mammo 4/19 - Plan: MM DIGITAL SCREENING BILATERAL  Bacterial vaginosis - Recurrent. Rx  solosec. May need maintenance tx if sx recur. Condoms/probiotics. - Plan: Secnidazole (SOLOSEC) 2 g PACK, POCT Wet Prep with KOH  Family history of ovarian cancer - My Risk testing discussed and pt declines. F/u prn.  Localized edema - LE edema after 8 hr shift. Try compression socks. If sx persist, will write note for only 4-6 hr shifts.   Meds ordered this encounter  Medications  . Secnidazole (SOLOSEC) 2 g PACK    Sig: Take 2 g by mouth once. Mix 2 g granules with yogurt or pudding for 1 dose    Dispense:  1 each    Refill:  0             GYN counsel mammography screening, adequate intake of calcium and vitamin D, diet and exercise     F/U  Return in about 1 year (around 02/25/2018).  Alonnah Lampkins B. Luca Burston, PA-C 02/25/2017 11:37 AM

## 2017-03-10 ENCOUNTER — Telehealth: Payer: Self-pay

## 2017-03-10 ENCOUNTER — Encounter: Payer: Self-pay | Admitting: Medical

## 2017-03-10 ENCOUNTER — Ambulatory Visit: Payer: BLUE CROSS/BLUE SHIELD | Admitting: Medical

## 2017-03-10 VITALS — BP 100/80 | HR 80 | Temp 97.2°F | Resp 18 | Ht 61.0 in | Wt 115.6 lb

## 2017-03-10 DIAGNOSIS — B9689 Other specified bacterial agents as the cause of diseases classified elsewhere: Secondary | ICD-10-CM

## 2017-03-10 DIAGNOSIS — N3001 Acute cystitis with hematuria: Secondary | ICD-10-CM

## 2017-03-10 DIAGNOSIS — N760 Acute vaginitis: Principal | ICD-10-CM

## 2017-03-10 LAB — POCT URINALYSIS DIPSTICK
Bilirubin, UA: NEGATIVE
GLUCOSE UA: NEGATIVE
KETONES UA: NEGATIVE
Nitrite, UA: NEGATIVE
PROTEIN UA: NEGATIVE
SPEC GRAV UA: 1.025 (ref 1.010–1.025)
Urobilinogen, UA: 0.2 E.U./dL
pH, UA: 6 (ref 5.0–8.0)

## 2017-03-10 MED ORDER — CIPROFLOXACIN HCL 500 MG PO TABS: 500.0000 mg | ORAL_TABLET | Freq: Two times a day (BID) | ORAL | 0 refills | Status: DC

## 2017-03-10 NOTE — Patient Instructions (Signed)
Drink plenty of water, may use AZO for  3 days and then stop . If sysptoms not improving return to the clinic.   Urinary Tract Infection, Adult A urinary tract infection (UTI) is an infection of any part of the urinary tract. The urinary tract includes the:  Kidneys.  Ureters.  Bladder.  Urethra.  These organs make, store, and get rid of pee (urine) in the body. Follow these instructions at home:  Take over-the-counter and prescription medicines only as told by your doctor.  If you were prescribed an antibiotic medicine, take it as told by your doctor. Do not stop taking the antibiotic even if you start to feel better.  Avoid the following drinks: ? Alcohol. ? Caffeine. ? Tea. ? Carbonated drinks.  Drink enough fluid to keep your pee clear or pale yellow.  Keep all follow-up visits as told by your doctor. This is important.  Make sure to: ? Empty your bladder often and completely. Do not to hold pee for long periods of time. ? Empty your bladder before and after sex. ? Wipe from front to back after a bowel movement if you are female. Use each tissue one time when you wipe. Contact a doctor if:  You have back pain.  You have a fever.  You feel sick to your stomach (nauseous).  You throw up (vomit).  Your symptoms do not get better after 3 days.  Your symptoms go away and then come back. Get help right away if:  You have very bad back pain.  You have very bad lower belly (abdominal) pain.  You are throwing up and cannot keep down any medicines or water. This information is not intended to replace advice given to you by your health care provider. Make sure you discuss any questions you have with your health care provider. Document Released: 10/30/2007 Document Revised: 10/19/2015 Document Reviewed: 04/03/2015 Elsevier Interactive Patient Education  [employer].

## 2017-03-10 NOTE — Telephone Encounter (Signed)
Pt has tried the compression socks for two weeks and they are not working.  [facility] said she would write a note for work.  [phone]

## 2017-03-10 NOTE — Progress Notes (Signed)
   Subjective:    Patient ID: Cassandra Pham, female    DOB: 02-16-1976, 41 y.o.   MRN: [id number]  HPI 41 yo female with started last  suprapubict pressure , urine with odor. Thinks she has a urinary tract infection. Pain on  Left side , lasting 15 min then resolved, yesterday.None today.  Review of Systems  Constitutional: Negative for chills and fever.  HENT: Negative for congestion, ear pain and sore throat.   Eyes: Negative for discharge and itching.  Respiratory: Negative for cough and shortness of breath.   Cardiovascular: Negative for chest pain.  Gastrointestinal: Negative for abdominal pain.  Genitourinary: Positive for dysuria, flank pain, frequency and urgency. Negative for hematuria.  Allergic/Immunologic: Negative for environmental allergies.  Neurological: Negative for dizziness, syncope and light-headedness.  Hematological: Negative for adenopathy.  Psychiatric/Behavioral: Negative for behavioral problems, self-injury and suicidal ideas. The patient is not nervous/anxious.        Objective:   Physical Exam  Constitutional: She is oriented to person, place, and time. She appears well-developed and well-nourished.  HENT:  Head: Normocephalic and atraumatic.  Eyes: Conjunctivae and EOM are normal.  Neck: Normal range of motion.  Abdominal: Soft. There is no tenderness.  Neurological: She is alert and oriented to person, place, and time.  Skin: Skin is warm and dry.  Psychiatric: She has a normal mood and affect. Her behavior is normal. Judgment and thought content normal.  Nursing note and vitals reviewed.  No CVA tenderness.  Urine dip moderated leuks Blood large amount.     Assessment & Plan:   UTI with hematruia Meds ordered this encounter  Medications  . ciprofloxacin (CIPRO) 500 MG tablet    Sig: Take 1 tablet (500 mg total) by mouth 2 (two) times daily.    Dispense:  14 tablet    Refill:  0   Increase water intake, reviewed hygiene with patient and  urinating after sexual intercourse.  Return to the clinic in  3 days if sympotoms not improving.  Urine culture sent off.

## 2017-03-11 ENCOUNTER — Encounter: Payer: Self-pay | Admitting: Obstetrics and Gynecology

## 2017-03-11 MED ORDER — CLINDAMYCIN PHOSPHATE 2 % VA CREA: 1.0000 | TOPICAL_CREAM | Freq: Every day | VAGINAL | 0 refills | Status: DC

## 2017-03-11 NOTE — Telephone Encounter (Signed)
Letter completed. Pt to pick up at front desk.  Pt also states BV sx did not improve after solosec Rx. Now on cipro for UTI. Cleocin eRxd. F/u prn. May need maintenance tx.

## 2017-03-12 LAB — URINE CULTURE

## 2017-03-13 DIAGNOSIS — J029 Acute pharyngitis, unspecified: Secondary | ICD-10-CM | POA: Diagnosis not present

## 2017-03-13 DIAGNOSIS — J069 Acute upper respiratory infection, unspecified: Secondary | ICD-10-CM | POA: Diagnosis not present

## 2017-03-17 ENCOUNTER — Telehealth: Payer: Self-pay

## 2017-03-17 NOTE — Telephone Encounter (Signed)
Called Cassandra Pham per H. Ratcliffe's instructions.  Advised that results of Urine Culture were received.  UC positive for e coli. Bacteria is susceptible to the antibiotic prescribed.  Advised for her to complete the prescription.  If not feeling better, please return to clinic as needed.

## 2017-03-17 NOTE — Progress Notes (Signed)
Please call patient and let her know she is on the correct antibiotic. Thank you  Daryll Drown PA-C

## 2017-04-04 ENCOUNTER — Encounter: Payer: Self-pay | Admitting: Adult Health

## 2017-04-04 ENCOUNTER — Ambulatory Visit: Payer: BLUE CROSS/BLUE SHIELD | Admitting: Adult Health

## 2017-04-04 VITALS — BP 120/70 | HR 71 | Temp 98.0°F | Resp 16 | Wt 116.0 lb

## 2017-04-04 DIAGNOSIS — J101 Influenza due to other identified influenza virus with other respiratory manifestations: Secondary | ICD-10-CM

## 2017-04-04 MED ORDER — OSELTAMIVIR PHOSPHATE 75 MG PO CAPS: 75.0000 mg | ORAL_CAPSULE | Freq: Two times a day (BID) | ORAL | 0 refills | Status: DC

## 2017-04-04 NOTE — Progress Notes (Addendum)
Subjective:     Patient ID: Cassandra Pham, female   DOB: 1975/12/11, 41 y.o.   MRN: [id number]  HPI   Patient is a 41 year old female in no acute distress who comes into the clinic with symptoms since Tuesday night  11/6  With fever, sore throat, congestion, body aches, mild headache.  She denies any difficulty breathing or respiratory distress.   Patient  denies any  chills, rash, chest pain, shortness of breath, nausea, vomiting, or diarrhea.   She is taking Mucinex and switched to Theraflu She is taking 400  mg Motrin as needed.    No one is at home is sick. 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 She also works at [employer].   Denies any recent illness or recent hospitalizations  She reports having flu immunization 2 weeks ago.   Flu TEST POCT testing positive for FLU A - patient has had Tamiflu before a few years ago and requests again.    Allergies  Allergen Reactions  . Ginger Hives    Patient Active Problem List   Diagnosis Date Noted  . Family history of ovarian cancer 02/25/2017  . Gout 10/02/2016    No orders of the defined types were placed in this encounter.   Review of Systems  Constitutional: Positive for appetite change (taking in fluids, appetite is decreased just snacking ), fatigue and fever (101 F  at home is highest oral  - intermittent  temperature started  04/02/17 ). Negative for chills, diaphoresis and unexpected weight change.  HENT: Positive for congestion (nasal only. ), ear pain (right ear ), postnasal drip, sneezing, sore throat and voice change (mild hoarseness ). Negative for hearing loss, rhinorrhea, sinus pressure, sinus pain and trouble swallowing.   Eyes: Negative.   Respiratory: Negative.   Cardiovascular: Negative.   Gastrointestinal: Negative.   Endocrine: Negative.   Genitourinary: Negative.  Negative for difficulty urinating, dyspareunia and dysuria.  Musculoskeletal:       Generalized body  aches  Skin: Negative.   Allergic/Immunologic: Negative.   Neurological: Negative.   Hematological: Negative.   Psychiatric/Behavioral: Negative.        Objective:   Physical Exam  Constitutional: She is oriented to person, place, and time. She appears well-developed and well-nourished. No distress.  HENT:  Head: Normocephalic and atraumatic.  Right Ear: External ear normal.  Left Ear: External ear normal.  Nose: Rhinorrhea present.  Mouth/Throat: Posterior oropharyngeal erythema (mild ) present. No oropharyngeal exudate, posterior oropharyngeal edema or tonsillar abscesses.  Eyes: Conjunctivae and EOM are normal. Pupils are equal, round, and reactive to light. Right eye exhibits no discharge. Left eye exhibits no discharge. No scleral icterus.  Neck: Normal range of motion. Neck supple. No JVD present. No tracheal deviation present. No thyromegaly present.  Cardiovascular: Normal rate, regular rhythm, normal heart sounds and intact distal pulses.  Pulmonary/Chest: Effort normal and breath sounds normal. No stridor. No respiratory distress. She has no wheezes. She has no rales. She exhibits no tenderness.  Abdominal: Soft. Bowel sounds are normal. She exhibits no distension and no mass. There is no tenderness. There is no rebound and no guarding.  Musculoskeletal: Normal range of motion. She exhibits no edema or tenderness.  Patient moves on and off of exam table and in room without difficulty. Gait is normal in hall and in room. Patient is oriented to person place time and situation. Patient answers questions appropriately and engages in conversation.  Lymphadenopathy:    She has no cervical adenopathy.  Neurological: She is alert and oriented to person, place, and time. She has normal reflexes. No cranial nerve deficit. Coordination normal.  Skin: Skin is warm and dry. No rash noted. She is not diaphoretic. No erythema. No pallor.  Psychiatric: She has a normal mood and affect. Her  behavior is normal. Judgment and thought content normal.  Vitals reviewed.      Assessment:    Influenza A     Positive by POCT Plan:     Flu A positive  Meds ordered this encounter  Medications  . oseltamivir (TAMIFLU) 75 MG capsule    Sig: Take 1 capsule (75 mg total) 2 (two) times daily by mouth.    Dispense:  10 capsule    Refill:  0    Return to clinic at any time  if any new symptoms change, worsen or do not improve. Symptoms should improve  within 72 hours and if not improving you should call for an appointment at the clinic or if worsening symptoms be seen in urgent care/emergency room  if clinic is closed. If any emergent symptoms call 911 at anytime. Patient verbalized above understanding of all instructions and denies any further questions at this time.

## 2017-04-04 NOTE — Patient Instructions (Addendum)
Oseltamivir capsules What is this medicine? OSELTAMIVIR (os el TAM i vir) is an antiviral medicine. It is used to prevent and to treat some kinds of influenza or the flu. It will not work for colds or other viral infections. This medicine may be used for other purposes; ask your health care provider or pharmacist if you have questions. COMMON BRAND NAME(S): Tamiflu What should I tell my health care provider before I take this medicine? They need to know if you have any of the following conditions: -heart disease -immune system problems -kidney disease -liver disease -lung disease -an unusual or allergic reaction to oseltamivir, other medicines, foods, dyes, or preservatives -pregnant or trying to get pregnant -breast-feeding How should I use this medicine? Take this medicine by mouth with a glass of water. Follow the directions on the prescription label. Start this medicine at the first sign of flu symptoms. You can take it with or without food. If it upsets your stomach, take it with food. Take your medicine at regular intervals. Do not take your medicine more often than directed. Take all of your medicine as directed even if you think you are better. Do not skip doses or stop your medicine early. Talk to your pediatrician regarding the use of this medicine in children. While this drug may be prescribed for children as young as 14 days for selected conditions, precautions do apply. Overdosage: If you think you have taken too much of this medicine contact a poison control center or emergency room at once. NOTE: This medicine is only for you. Do not share this medicine with others. What if I miss a dose? If you miss a dose, take it as soon as you remember. If it is almost time for your next dose (within 2 hours), take only that dose. Do not take double or extra doses. What may interact with this medicine? Interactions are not expected. This list may not describe all possible interactions. Give  your health care provider a list of all the medicines, herbs, non-prescription drugs, or dietary supplements you use. Also tell them if you smoke, drink alcohol, or use illegal drugs. Some items may interact with your medicine. What should I watch for while using this medicine? Visit your doctor or health care professional for regular check ups. Tell your doctor if your symptoms do not start to get better or if they get worse. If you have the flu, you may be at an increased risk of developing seizures, confusion, or abnormal behavior. This occurs early in the illness, and more frequently in children and teens. These events are not common, but may result in accidental injury to the patient. Families and caregivers of patients should watch for signs of unusual behavior and contact a doctor or health care professional right away if the patient shows signs of unusual behavior. This medicine is not a substitute for the flu shot. Talk to your doctor each year about an annual flu shot. What side effects may I notice from receiving this medicine? Side effects that you should report to your doctor or health care professional as soon as possible: -allergic reactions like skin rash, itching or hives, swelling of the face, lips, or tongue -anxiety, confusion, unusual behavior -breathing problems -hallucination, loss of contact with reality -redness, blistering, peeling or loosening of the skin, including inside the mouth -seizures Side effects that usually do not require medical attention (report to your doctor or health care professional if they continue or are bothersome): -diarrhea -headache -nausea,   vomiting -pain This list may not describe all possible side effects. Call your doctor for medical advice about side effects. You may report side effects to FDA at 1-800-FDA-1088. Where should I keep my medicine? Keep out of the reach of children. Store at room temperature between 15 and 30 degrees C (59 and  86 degrees F). Throw away any unused medicine after the expiration date. NOTE: This sheet is a summary. It may not cover all possible information. If you have questions about this medicine, talk to your doctor, pharmacist, or health care provider.  2018 Elsevier/Gold Standard (2014-11-16 10:50:39) Influenza, Adult Influenza ("the flu") is an infection in the lungs, nose, and throat (respiratory tract). It is caused by a virus. The flu causes many common cold symptoms, as well as a high fever and body aches. It can make you feel very sick. The flu spreads easily from person to person (is contagious). Getting a flu shot (influenza vaccination) every year is the best way to prevent the flu. Follow these instructions at home:  Take over-the-counter and prescription medicines only as told by your doctor.  Use a cool mist humidifier to add moisture (humidity) to the air in your home. This can make it easier to breathe.  Rest as needed.  Drink enough fluid to keep your pee (urine) clear or pale yellow.  Cover your mouth and nose when you cough or sneeze.  Wash your hands with soap and water often, especially after you cough or sneeze. If you cannot use soap and water, use hand sanitizer.  Stay home from work or school as told by your doctor. Unless you are visiting your doctor, try to avoid leaving home until your fever has been gone for 24 hours without the use of medicine.  Keep all follow-up visits as told by your doctor. This is important. How is this prevented?  Getting a yearly (annual) flu shot is the best way to avoid getting the flu. You may get the flu shot in late summer, fall, or winter. Ask your doctor when you should get your flu shot.  Wash your hands often or use hand sanitizer often.  Avoid contact with people who are sick during cold and flu season.  Eat healthy foods.  Drink plenty of fluids.  Get enough sleep.  Exercise regularly. Contact a doctor if:  You get  new symptoms.  You have: ? Chest pain. ? Watery poop (diarrhea). ? A fever.  Your cough gets worse.  You start to have more mucus.  You feel sick to your stomach (nauseous).  You throw up (vomit). Get help right away if:  You start to be short of breath or have trouble breathing.  Your skin or nails turn a bluish color.  You have very bad pain or stiffness in your neck.  You get a sudden headache.  You get sudden pain in your face or ear.  You cannot stop throwing up. This information is not intended to replace advice given to you by your health care provider. Make sure you discuss any questions you have with your health care provider. Document Released: 02/20/2008 Document Revised: 10/19/2015 Document Reviewed: 03/07/2015 Elsevier Interactive Patient Education  2017 [employer].

## 2017-09-16 ENCOUNTER — Other Ambulatory Visit: Payer: Self-pay | Admitting: Obstetrics and Gynecology

## 2017-09-16 DIAGNOSIS — Z1231 Encounter for screening mammogram for malignant neoplasm of breast: Secondary | ICD-10-CM

## 2017-09-23 ENCOUNTER — Ambulatory Visit
Admission: RE | Admit: 2017-09-23 | Discharge: 2017-09-23 | Disposition: A | Discharge status: Home / Self Care | Payer: BLUE CROSS/BLUE SHIELD | Source: Ambulatory Visit | Attending: Obstetrics and Gynecology | Admitting: Obstetrics and Gynecology

## 2017-09-23 DIAGNOSIS — Z1231 Encounter for screening mammogram for malignant neoplasm of breast: Secondary | ICD-10-CM | POA: Diagnosis not present

## 2017-09-24 ENCOUNTER — Encounter: Payer: Self-pay | Admitting: Obstetrics and Gynecology

## 2017-10-29 ENCOUNTER — Encounter: Payer: Self-pay | Admitting: Obstetrics and Gynecology

## 2017-12-01 ENCOUNTER — Telehealth: Payer: Self-pay

## 2017-12-01 NOTE — Telephone Encounter (Signed)
Order given to nurse to coordinate with pt.

## 2017-12-01 NOTE — Telephone Encounter (Signed)
Pt aware we do not provide Primary Care Services any longer. Pt needs MMR vaccine done for school. She works for [employer]. She can get it done at the [facility] at [place]. She is inquiring if [facility] is willing to send a rx/order for her to have the vaccine. She will be leaving to go out of the country tomorrow & request cb today or by tomorrow by 8 am. Cb#[phone]

## 2017-12-01 NOTE — Telephone Encounter (Signed)
Spoke w/pt. Notified [facility] has written the rx which I can fax. Pt states it needs to have an ICD-10 code on it. Fax [phone]. Note faxed.

## 2017-12-09 ENCOUNTER — Ambulatory Visit: Payer: BLUE CROSS/BLUE SHIELD | Admitting: Medical

## 2017-12-09 ENCOUNTER — Encounter: Payer: Self-pay | Admitting: Medical

## 2017-12-09 VITALS — BP 113/84 | HR 73 | Temp 97.0°F | Resp 16 | Wt 116.2 lb

## 2017-12-09 DIAGNOSIS — R300 Dysuria: Secondary | ICD-10-CM

## 2017-12-09 LAB — POCT URINALYSIS DIPSTICK
Bilirubin, UA: NEGATIVE
GLUCOSE UA: NEGATIVE
Ketones, UA: NEGATIVE
Leukocytes, UA: NEGATIVE
Nitrite, UA: NEGATIVE
Protein, UA: NEGATIVE
RBC UA: NEGATIVE
Spec Grav, UA: 1.015 (ref 1.010–1.025)
Urobilinogen, UA: 0.2 E.U./dL
pH, UA: 7 (ref 5.0–8.0)

## 2017-12-09 MED ORDER — CIPROFLOXACIN HCL 500 MG PO TABS: 500.0000 mg | ORAL_TABLET | Freq: Two times a day (BID) | ORAL | 0 refills | Status: DC

## 2017-12-09 NOTE — Progress Notes (Signed)
   Subjective:    Patient ID: Cassandra Pham, female    DOB: 12-Feb-1976, 42 y.o.   MRN: [id number]  HPI 42 yo female in non acute distress. Started yesterday with burning on urination, fequency, pressure in the back and in the front of  pressure in lower abdomen and odor. Denies fever chills, has lower abdominal pressure and lower back pain.  Denies shortness of breath or chest pain.   Review of Systems  Constitutional: Negative for chills and fever.  HENT: Negative for congestion, ear discharge and sore throat.   Eyes: Negative for discharge and itching.  Respiratory: Negative for cough and shortness of breath.   Cardiovascular: Negative for chest pain, palpitations and leg swelling.  Gastrointestinal: Positive for abdominal pain (pressure lower abdomen). Negative for diarrhea, nausea and vomiting.  Endocrine: Positive for polyuria. Negative for polydipsia and polyphagia.  Genitourinary: Positive for dysuria, frequency, urgency and vaginal discharge (started today. going to see her ob/gyn  Fraser Din). Negative for decreased urine volume, difficulty urinating (just pressure) and vaginal pain.  Musculoskeletal: Positive for back pain (lower back).  Skin: Negative for rash.  Allergic/Immunologic: Positive for food allergies (ginger). Negative for environmental allergies.  Neurological: Negative for dizziness, syncope and light-headedness.  Hematological: Negative for adenopathy.  Psychiatric/Behavioral: Negative for behavioral problems, confusion, self-injury and suicidal ideas.       Objective:   Physical Exam  Constitutional: She is oriented to person, place, and time. She appears well-developed and well-nourished.  HENT:  Head: Normocephalic and atraumatic.  Eyes: Pupils are equal, round, and reactive to light. EOM are normal.  Neck: Normal range of motion.  Cardiovascular: Normal rate, regular rhythm and normal heart sounds.  Pulmonary/Chest: Effort normal and breath sounds  normal.  Abdominal: Soft. Bowel sounds are normal. She exhibits no distension and no mass. There is tenderness (pressure with palpation on lower abdomen , no  pain.). There is no rebound and no guarding.  Neurological: She is alert and oriented to person, place, and time.  Skin: Skin is warm and dry.  Psychiatric: She has a normal mood and affect. Her behavior is normal. Judgment and thought content normal.  Nursing note and vitals reviewed.  Pressure with palpation of lower abdomen.   urine dip wnl  , reviewed with patient. Patient on cell phone during visit with direct TV. Assessment & Plan:  Urinary tract infection., will treat due to symptoms. Sent off for Urine  Culture. Follow up with your OB/GYN in 3 days if not improving if UTI symptoms not improving. Follow up with your  OB/GYN for vaginal discharge. Patient verbalizes understanding and has no questions at discharge.Marland Kitchen

## 2017-12-09 NOTE — Patient Instructions (Signed)

## 2017-12-11 LAB — URINE CULTURE: ORGANISM ID, BACTERIA: NO GROWTH

## 2017-12-12 ENCOUNTER — Telehealth: Payer: Self-pay | Admitting: Medical

## 2017-12-12 NOTE — Telephone Encounter (Signed)
Reviewed urine test results with patient. No bacterial growth in urine. She continues to have symptoms, she has not contacted her OB/GYN doctor as recommended.  Asked if she was concerned about STDs and if she needed testing. She denies any concerns.  Again recommended patient to call Fraser Din her OB/GYN doctor to do further evaluation/exam.  She verbalizes she will.  Patient verbalizes understand and has no further questions at the end of our conversation.

## 2017-12-16 ENCOUNTER — Ambulatory Visit: Payer: BLUE CROSS/BLUE SHIELD

## 2017-12-16 DIAGNOSIS — Z23 Encounter for immunization: Secondary | ICD-10-CM

## 2017-12-29 ENCOUNTER — Telehealth: Payer: Self-pay | Admitting: Obstetrics and Gynecology

## 2017-12-29 NOTE — Telephone Encounter (Signed)
I have scheduled this patient with you on 8/12 for headaches.  She said she was a long time patient of yours.  I did let her know that I would schedule but needed to make sure that you were okay seeing her for this.  Let me know if I need to call her back and cancel.  She does not have a PCP at this time, in the middle of looking for one.  Thanks!

## 2017-12-30 NOTE — Telephone Encounter (Signed)
I don't manage headaches for pts because they can be caused by so many things. She needs to find PCP for this. Thanks.

## 2018-01-05 ENCOUNTER — Ambulatory Visit: Payer: BLUE CROSS/BLUE SHIELD | Admitting: Obstetrics and Gynecology

## 2018-01-30 ENCOUNTER — Ambulatory Visit: Payer: BLUE CROSS/BLUE SHIELD | Admitting: Primary Care

## 2018-01-30 ENCOUNTER — Encounter

## 2018-01-30 ENCOUNTER — Encounter: Payer: Self-pay | Admitting: Primary Care

## 2018-01-30 VITALS — BP 108/74 | HR 79 | Temp 98.1°F | Ht 61.0 in | Wt 118.0 lb

## 2018-01-30 DIAGNOSIS — F32A Depression, unspecified: Secondary | ICD-10-CM | POA: Insufficient documentation

## 2018-01-30 DIAGNOSIS — M25512 Pain in left shoulder: Secondary | ICD-10-CM | POA: Diagnosis not present

## 2018-01-30 DIAGNOSIS — G43709 Chronic migraine without aura, not intractable, without status migrainosus: Secondary | ICD-10-CM

## 2018-01-30 DIAGNOSIS — J4520 Mild intermittent asthma, uncomplicated: Secondary | ICD-10-CM | POA: Diagnosis not present

## 2018-01-30 DIAGNOSIS — J45909 Unspecified asthma, uncomplicated: Secondary | ICD-10-CM | POA: Insufficient documentation

## 2018-01-30 DIAGNOSIS — R200 Anesthesia of skin: Secondary | ICD-10-CM

## 2018-01-30 DIAGNOSIS — F419 Anxiety disorder, unspecified: Secondary | ICD-10-CM

## 2018-01-30 DIAGNOSIS — F329 Major depressive disorder, single episode, unspecified: Secondary | ICD-10-CM

## 2018-01-30 MED ORDER — SUMATRIPTAN SUCCINATE 25 MG PO TABS: ORAL_TABLET | ORAL | 0 refills | Status: DC

## 2018-01-30 MED ORDER — PREDNISONE 20 MG PO TABS: ORAL_TABLET | ORAL | 0 refills | Status: DC

## 2018-01-30 NOTE — Assessment & Plan Note (Signed)
Present for 1-2 months, no injury or neck pain. Exam today unremarkable.  Sounds like nerve irritation which could be coming from the shoulder joint.   Trial low dose prednisone taper x 8 days. She will update if no improvement. Consider PT if no improvement.

## 2018-01-30 NOTE — Progress Notes (Signed)
Subjective:    Patient ID: Cassandra Pham, female    DOB: 19-Oct-1975, 41 y.o.   MRN: [id number]  HPI  [name] is a 42 year old female who presents today to establish care and discuss the problems mentioned below. Will obtain old records.  1) Shoulder/Extremity Pain: Pain initiates to the left shoulder then radiates to her left upper extremity. Also noticed left wrist pain beginning yesterday. She will experience "coldness" and numbness to the upper extremity and will wrap it for comfort. This began 1-2 months ago. She denies neck pain, injury/trauma, reduction in ROM. She's taken Motrin, Aleve without improvement.   2) Asthma: Diagnosed during childhood. No recent problems with asthma in several years. She denies wheezing.   3) Anxiety and Depression: History of years ago after the birth of her fourth child. She was once on medication but this was 17 years ago. She does struggle with anxiety which resurfaced one year ago. Symptoms include shortness of breath, feeling jittery, feeling anxious/nervous. For the most part she's been able to handle symptoms on her own until just recently. She will call her sister or talk herself through her symptoms. She recently started seeing a therapist one month ago.   4) Migraines/Frequent Headaches: Chronic. She experiences migraines three times monthly on average which are located to the bilateral temporal region and to left frontal lobe. Migraines will last several hours and she will experience photophobia, phonophobia, nausea.  She will drink [facility], take Excedrin Migraine with improvement up until recently. She's never tried Rx treatment for abortion.    Review of Systems  Eyes: Negative for visual disturbance.  Respiratory: Negative for shortness of breath.   Cardiovascular: Negative for chest pain.  Musculoskeletal: Negative for neck pain.       Left shoulder and upper extremity pain  Neurological: Positive for headaches. Negative for  dizziness.  Psychiatric/Behavioral:       See HPI       Past Medical History:  Diagnosis Date  . Anxiety and depression   . Asthma   . Bacterial vaginosis   . Medical history non-contributory   . Migraine      Social History   Socioeconomic History  . Marital status: Married    Spouse name: Not on file  . Number of children: Not on file  . Years of education: Not on file  . Highest education level: Not on file  Occupational History  . Not on file  Social Needs  . Financial resource strain: Not on file  . Food insecurity:    Worry: Not on file    Inability: Not on file  . Transportation needs:    Medical: Not on file    Non-medical: Not on file  Tobacco Use  . Smoking status: Never Smoker  . Smokeless tobacco: Never Used  Substance and Sexual Activity  . Alcohol use: No  . Drug use: No  . Sexual activity: Yes    Birth control/protection: Surgical  Lifestyle  . Physical activity:    Days per week: Not on file    Minutes per session: Not on file  . Stress: Not on file  Relationships  . Social connections:    Talks on phone: Not on file    Gets together: Not on file    Attends religious service: Not on file    Active member of club or organization: Not on file    Attends meetings of clubs or organizations: Not on file  Relationship status: Not on file  . Intimate partner violence:    Fear of current or ex partner: Not on file    Emotionally abused: Not on file    Physically abused: Not on file    Forced sexual activity: Not on file  Other Topics Concern  . Not on file  Social History Narrative   Single.   4 children.   Works as a Administrator.   Enjoys spending time with family and friends.     Past Surgical History:  Procedure Laterality Date  . APPENDECTOMY    . ESOPHAGOGASTRODUODENOSCOPY (EGD) WITH PROPOFOL N/A 10/05/2015   Procedure: ESOPHAGOGASTRODUODENOSCOPY (EGD) WITH PROPOFOL;  Surgeon: Hulen Luster, MD;  Location: [facility] ENDOSCOPY;   Service: Gastroenterology;  Laterality: N/A;  . TUBAL LIGATION      Family History  Problem Relation Age of Onset  . Hypertension Mother   . Diabetes Father   . Hypercholesterolemia Father   . Pancreatic cancer Maternal Grandmother 45  . Ovarian cancer Maternal Aunt 54  . Breast cancer Neg Hx     Allergies  Allergen Reactions  . Ginger Hives    Current Outpatient Medications on File Prior to Visit  Medication Sig Dispense Refill  . Cholecalciferol (VITAMIN D-1000 MAX ST) 1000 units tablet Take by mouth.    . ferrous sulfate 325 (65 FE) MG EC tablet Take 325 mg by mouth daily with breakfast.    . vitamin B-12 (CYANOCOBALAMIN) 500 MCG tablet Take 500 mcg by mouth daily.     No current facility-administered medications on file prior to visit.     BP 108/74   Pulse 79   Temp 98.1 F (36.7 C) (Oral)   Ht 5\' 1"  (1.549 m)   Wt 118 lb (53.5 kg)   LMP 01/18/2018   SpO2 99%   BMI 22.30 kg/m    Objective:   Physical Exam  Constitutional: She appears well-nourished.  Neck: Neck supple.  Cardiovascular: Normal rate and regular rhythm.  Respiratory: Effort normal and breath sounds normal.  Musculoskeletal:       Left shoulder: She exhibits pain. She exhibits normal range of motion, no tenderness, no swelling and normal strength.  5/5 strength to bilateral upper extremities  Skin: Skin is warm and dry.  Psychiatric: She has a normal mood and affect.           Assessment & Plan:

## 2018-01-30 NOTE — Assessment & Plan Note (Signed)
Chronic, occurring three times monthly.  Recently no improvement with OTC treatment. Could be triggered by increased anxiety, she is working with a therapist now.   Rx for low dose Imitrex sent to pharmacy. Discussed directions for use and to update.

## 2018-01-30 NOTE — Assessment & Plan Note (Signed)
No problems in years. No wheezing on exam. Continue to monitor.

## 2018-01-30 NOTE — Patient Instructions (Signed)
Start prednisone tablets. Take 2 tablets for 4 days, then 1 tablet for 4 days.  You may try the sumatriptan medication for migraine abortion. Take 1 tablet at migraine onset. May repeat in 2 hours if no resolve. Do not exceed 2 tablets in 24 hours.  Please message or call me if no improvement to your shoulder/arm in one week.   It was a pleasure to meet you today! Please don't hesitate to call or message me with any questions. Welcome to [facility]!

## 2018-01-30 NOTE — Assessment & Plan Note (Signed)
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  Does struggle with daily anxiety and is now seeing a therapist. Discussed for her to update if symptoms persist despite therapy. Consider low dose SSRI at that point.

## 2018-02-05 ENCOUNTER — Telehealth: Payer: Self-pay | Admitting: Primary Care

## 2018-02-05 DIAGNOSIS — M25512 Pain in left shoulder: Secondary | ICD-10-CM

## 2018-02-05 NOTE — Telephone Encounter (Signed)
Copied from [place] [phone]. Topic: Quick Communication - See Telephone Encounter >> Feb 05, 2018 11:10 AM Neva Seat wrote: Pt calling back to let [facility] know her arm is not getting better.  Once the medication wears off the pain comes back.

## 2018-02-05 NOTE — Telephone Encounter (Signed)
Please thank patient for the update. Let's have her come in for an xray of her shoulder. Is she agreeable to physical therapy? This would be the next best treatment.  I can send medication called diclofenac to her pharmacy to help with pain and inflammation. Cannot take Aleve, Motrin, Ibuprofen etc with this medication.  Let me know if she's agreeable to the xray, physical therapy, medication.

## 2018-02-09 NOTE — Telephone Encounter (Signed)
Pt calling to check to see what doctors recommendations are.

## 2018-02-09 NOTE — Telephone Encounter (Signed)
Message left for patient to return my call.  

## 2018-02-10 ENCOUNTER — Ambulatory Visit
Admission: RE | Admit: 2018-02-10 | Discharge: 2018-02-10 | Disposition: A | Discharge status: Home / Self Care | Payer: BLUE CROSS/BLUE SHIELD | Source: Ambulatory Visit | Attending: Primary Care | Admitting: Primary Care

## 2018-02-10 DIAGNOSIS — M25512 Pain in left shoulder: Secondary | ICD-10-CM

## 2018-02-10 MED ORDER — DICLOFENAC SODIUM 75 MG PO TBEC: 75.0000 mg | DELAYED_RELEASE_TABLET | Freq: Two times a day (BID) | ORAL | 0 refills | Status: DC | PRN

## 2018-02-10 NOTE — Telephone Encounter (Signed)
Spoken and notified patient of Cassandra Pham comments. Patient will come in and get the x-ray of the shoulder. She is agreeable to the PT and to diclofenac.

## 2018-02-10 NOTE — Telephone Encounter (Signed)
Noted, orders placed. 

## 2018-02-19 ENCOUNTER — Ambulatory Visit (INDEPENDENT_AMBULATORY_CARE_PROVIDER_SITE_OTHER)
Admission: RE | Admit: 2018-02-19 | Discharge: 2018-02-19 | Disposition: A | Discharge status: Home / Self Care | Payer: BLUE CROSS/BLUE SHIELD | Source: Ambulatory Visit | Attending: Primary Care | Admitting: Primary Care

## 2018-02-19 DIAGNOSIS — M25512 Pain in left shoulder: Secondary | ICD-10-CM | POA: Diagnosis not present

## 2018-03-03 ENCOUNTER — Ambulatory Visit (INDEPENDENT_AMBULATORY_CARE_PROVIDER_SITE_OTHER): Payer: BLUE CROSS/BLUE SHIELD | Admitting: Obstetrics and Gynecology

## 2018-03-03 ENCOUNTER — Encounter: Payer: Self-pay | Admitting: Obstetrics and Gynecology

## 2018-03-03 VITALS — BP 108/70 | HR 76 | Ht 60.0 in | Wt 121.0 lb

## 2018-03-03 DIAGNOSIS — N760 Acute vaginitis: Secondary | ICD-10-CM

## 2018-03-03 DIAGNOSIS — Z01411 Encounter for gynecological examination (general) (routine) with abnormal findings: Secondary | ICD-10-CM | POA: Diagnosis not present

## 2018-03-03 DIAGNOSIS — Z8041 Family history of malignant neoplasm of ovary: Secondary | ICD-10-CM

## 2018-03-03 DIAGNOSIS — R200 Anesthesia of skin: Secondary | ICD-10-CM

## 2018-03-03 DIAGNOSIS — N920 Excessive and frequent menstruation with regular cycle: Secondary | ICD-10-CM | POA: Diagnosis not present

## 2018-03-03 DIAGNOSIS — Z1239 Encounter for other screening for malignant neoplasm of breast: Secondary | ICD-10-CM | POA: Diagnosis not present

## 2018-03-03 DIAGNOSIS — B9689 Other specified bacterial agents as the cause of diseases classified elsewhere: Secondary | ICD-10-CM | POA: Diagnosis not present

## 2018-03-03 DIAGNOSIS — Z01419 Encounter for gynecological examination (general) (routine) without abnormal findings: Secondary | ICD-10-CM

## 2018-03-03 MED ORDER — METRONIDAZOLE 1.3 % VA GEL: 1.0000 | Freq: Once | VAGINAL | 0 refills | Status: AC

## 2018-03-03 NOTE — Progress Notes (Signed)
PCP:  Pleas Koch, NP   Chief Complaint  Patient presents with  . Gynecologic Exam    discharge and little odor x a few months, has noticed that right after cycle discharge is heavy, no itchiness     HPI:      Cassandra Pham is a 42 y.o. [id number] who LMP was Patient's last menstrual period was 02/11/2018 (approximate)., presents today for her annual examination.  Her menses are regular every 28-30 days, lasting 5 days, very heavy for 3 days, changing pads/tampons Q30-60 min. Had WNL CBC 2018.  Dysmenorrhea mild, occurring first 1-2 days of flow. She does not have intermenstrual bleeding.  Sex activity: single partner, contraception - tubal ligation.  Last Pap: 02/05/16  Results were: no abnormalities /neg HPV DNA  Hx of STDs: none  She complains of increased d/c and odor without irritation for the past few days. Sx triggered by menses. Not taking probiotics. Using dove soap/no dryer sheets. Prefers gel Rx rather than cream. She has a hx of BV.  Last mammogram: 09/23/17  Results were: normal--routine follow-up in 12 months There is a FH of breast cancer in her mat aunt. There is a FH of ovarian cancer in her mat aunt and pancreatic cancer in her MGM, genetic testing not done/pt declined last yr. The patient does do self-breast exams.  Tobacco use: The patient denies current or previous tobacco use. Alcohol use: social drinker No drug use.  Exercise: not active  She does not get adequate calcium but does get Vitamin D in her diet.  Pt with hx of LT arm pain/numbness for the past few months. Sx occur 3-4 times wkly and last hours. Pt has pain starting in shoulder or forearm, then develops a cold feeling in her arm even though the skin is warm to touch. Then develops numbness. No change in ROM/strength with sx. No other limbs affected. No past trauma/injury. Has sx at night too but sleeps on her back. No aggrav/allev factors. Had neg xray with PCP.  Hx of increased migraine  headaches recently. Had neg CBC/B12 and folate 2018. (Office lab closed for the day).    Past Medical History:  Diagnosis Date  . Anxiety and depression   . Asthma   . Bacterial vaginosis   . Medical history non-contributory   . Migraine     Past Surgical History:  Procedure Laterality Date  . APPENDECTOMY    . ESOPHAGOGASTRODUODENOSCOPY (EGD) WITH PROPOFOL N/A 10/05/2015   Procedure: ESOPHAGOGASTRODUODENOSCOPY (EGD) WITH PROPOFOL;  Surgeon: Hulen Luster, MD;  Location: [facility] ENDOSCOPY;  Service: Gastroenterology;  Laterality: N/A;  . TUBAL LIGATION      Family History  Problem Relation Age of Onset  . Hypertension Mother   . Diabetes Father        DM Type 2  . Hypercholesterolemia Father   . Pancreatic cancer Maternal Grandmother 23  . Ovarian cancer Maternal Aunt 81  . Breast cancer Maternal Aunt     Social History   Socioeconomic History  . Marital status: Married    Spouse name: Not on file  . Number of children: Not on file  . Years of education: Not on file  . Highest education level: Not on file  Occupational History  . Not on file  Social Needs  . Financial resource strain: Not on file  . Food insecurity:    Worry: Not on file    Inability: Not on file  . Transportation needs:  Medical: Not on file    Non-medical: Not on file  Tobacco Use  . Smoking status: Never Smoker  . Smokeless tobacco: Never Used  Substance and Sexual Activity  . Alcohol use: No  . Drug use: No  . Sexual activity: Yes    Birth control/protection: Surgical    Comment: tubal ligation  Lifestyle  . Physical activity:    Days per week: Not on file    Minutes per session: Not on file  . Stress: Not on file  Relationships  . Social connections:    Talks on phone: Not on file    Gets together: Not on file    Attends religious service: Not on file    Active member of club or organization: Not on file    Attends meetings of clubs or organizations: Not on file    Relationship  status: Not on file  . Intimate partner violence:    Fear of current or ex partner: Not on file    Emotionally abused: Not on file    Physically abused: Not on file    Forced sexual activity: Not on file  Other Topics Concern  . Not on file  Social History Narrative   Single.   4 children.   Works as a Administrator.   Enjoys spending time with family and friends.     Current Meds  Medication Sig  . Cholecalciferol (VITAMIN D-1000 MAX ST) 1000 units tablet Take by mouth.  . ferrous sulfate 325 (65 FE) MG EC tablet Take 325 mg by mouth daily with breakfast.  . SUMAtriptan (IMITREX) 25 MG tablet Take 1 tablet by mouth at migraine onset. May repeat in 2 hours if headache persists or recurs.  . vitamin B-12 (CYANOCOBALAMIN) 500 MCG tablet Take 500 mcg by mouth daily.     ROS:  Review of Systems  Constitutional: Negative for fatigue, fever and unexpected weight change.  Respiratory: Negative for cough, shortness of breath and wheezing.   Cardiovascular: Negative for chest pain, palpitations and leg swelling.  Gastrointestinal: Negative for blood in stool, constipation, diarrhea, nausea and vomiting.  Endocrine: Negative for cold intolerance, heat intolerance and polyuria.  Genitourinary: Positive for vaginal discharge. Negative for dyspareunia, dysuria, flank pain, frequency, genital sores, hematuria, menstrual problem, pelvic pain, urgency, vaginal bleeding and vaginal pain.  Musculoskeletal: Negative for back pain, joint swelling and myalgias.  Skin: Negative for rash.  Neurological: Positive for numbness and headaches. Negative for dizziness, syncope and light-headedness.  Hematological: Negative for adenopathy.  Psychiatric/Behavioral: Positive for agitation. Negative for confusion, sleep disturbance and suicidal ideas. The patient is not nervous/anxious.      Objective: BP 108/70   Pulse 76   Ht 5' (1.524 m)   Wt 121 lb (54.9 kg)   LMP 02/11/2018 (Approximate)   BMI  23.63 kg/m    Physical Exam  Constitutional: She is oriented to person, place, and time. She appears well-developed and well-nourished.  Genitourinary: Vagina normal and uterus normal. There is no rash or tenderness on the right labia. There is no rash or tenderness on the left labia. No erythema or tenderness in the vagina. No vaginal discharge found. Right adnexum does not display mass and does not display tenderness. Left adnexum does not display mass and does not display tenderness. Cervix does not exhibit motion tenderness or polyp. Uterus is not enlarged or tender.  Neck: Normal range of motion. No thyromegaly present.  Cardiovascular: Normal rate, regular rhythm and normal heart sounds.  No  murmur heard. Pulmonary/Chest: Effort normal and breath sounds normal. Right breast exhibits no mass, no nipple discharge, no skin change and no tenderness. Left breast exhibits no mass, no nipple discharge, no skin change and no tenderness.  Abdominal: Soft. There is no tenderness. There is no guarding.  Musculoskeletal: Normal range of motion.  Neurological: She is alert and oriented to person, place, and time. No cranial nerve deficit.  Psychiatric: She has a normal mood and affect. Her behavior is normal.  Vitals reviewed.   Results: Results for orders placed or performed in visit on 03/03/18 (from the past 24 hour(s))  POCT Wet Prep with KOH     Status: Abnormal   Collection Time: 03/04/18  8:18 AM  Result Value Ref Range   Trichomonas, UA Negative    Clue Cells Wet Prep HPF POC pos    Epithelial Wet Prep HPF POC     Yeast Wet Prep HPF POC neg    Bacteria Wet Prep HPF POC     RBC Wet Prep HPF POC     WBC Wet Prep HPF POC     KOH Prep POC Positive (A) Negative    Assessment/Plan: Encounter for annual routine gynecological examination  Screening for breast cancer - Pt current on mammo - Plan: MM 3D SCREEN BREAST BILATERAL  Family history of ovarian cancer - MyRisk testing discussed  and declined. Handout given. F/u prn  Menorrhagia with regular cycle - Discussed BC, IUD, ablation, lysteda. Pt interested in ablation. REfer to MD for conf. Lab closed today so no labs done. Can do with MD prn.  Bacterial vaginosis - Rx nuvessa/coupon card. Add probiotics. Will RF prn sx.  - Plan: metroNIDAZOLE (NUVESSA) 1.3 % GEL, POCT Wet Prep with KOH  Arm numbness - Refer to neuro for further eval. Neg exam. - Plan: Ambulatory referral to Neurology  Meds ordered this encounter  Medications  . metroNIDAZOLE (NUVESSA) 1.3 % GEL    Sig: Place 1 Tube vaginally once for 1 dose.    Dispense:  1 Tube    Refill:  0    Order Specific Question:   Supervising Provider    Answer:   Gae Dry [[id number]]             GYN counsel mammography screening, adequate intake of calcium and vitamin D, diet and exercise     F/U  Return in about 1 week (around 03/10/2018) for with MD to discuss endometrial ablation.  Cassandra Siracusa B. Ewell Benassi, PA-C 03/04/2018 8:25 AM

## 2018-03-03 NOTE — Patient Instructions (Signed)
I value your feedback and entrusting us with your care. If you get a [facility] patient survey, I would appreciate you taking the time to let us know about your experience today. Thank you! 

## 2018-03-04 ENCOUNTER — Encounter: Payer: Self-pay | Admitting: Obstetrics and Gynecology

## 2018-03-04 LAB — POCT WET PREP WITH KOH
CLUE CELLS WET PREP PER HPF POC: POSITIVE
KOH Prep POC: POSITIVE — AB
TRICHOMONAS UA: NEGATIVE
Yeast Wet Prep HPF POC: NEGATIVE

## 2018-03-13 ENCOUNTER — Ambulatory Visit: Payer: BLUE CROSS/BLUE SHIELD | Admitting: Obstetrics & Gynecology

## 2018-03-17 ENCOUNTER — Telehealth: Payer: Self-pay

## 2018-03-17 ENCOUNTER — Telehealth: Payer: Self-pay | Admitting: Obstetrics and Gynecology

## 2018-03-17 ENCOUNTER — Other Ambulatory Visit: Payer: Self-pay | Admitting: Obstetrics and Gynecology

## 2018-03-17 DIAGNOSIS — N760 Acute vaginitis: Principal | ICD-10-CM

## 2018-03-17 DIAGNOSIS — B9689 Other specified bacterial agents as the cause of diseases classified elsewhere: Secondary | ICD-10-CM

## 2018-03-17 MED ORDER — CLINDAMYCIN PHOSPHATE 2 % VA CREA: 1.0000 | TOPICAL_CREAM | Freq: Every day | VAGINAL | 0 refills | Status: AC

## 2018-03-17 NOTE — Progress Notes (Signed)
Rx RF for recurrent BV sx. Already treated with  Metrogel.

## 2018-03-17 NOTE — Telephone Encounter (Signed)
Lmtrc

## 2018-03-17 NOTE — Telephone Encounter (Signed)
Patient is aware of appointment at [facility] Neurology on 04/07/18 @ 3:30pm w/ Chipper Herb, NP. Patient is aware to arrive at 3:15pm at Neurology on the 2nd floor.

## 2018-03-17 NOTE — Telephone Encounter (Signed)
Pls let pt know Rx eRxd. Izora Gala did referral and will call pt with appt info. Thx.

## 2018-03-17 NOTE — Telephone Encounter (Signed)
Pt aware.

## 2018-03-17 NOTE — Telephone Encounter (Signed)
Please advise 

## 2018-03-17 NOTE — Telephone Encounter (Signed)
Pt was seen recently and states she is having S&S of BV again. [facility] told her to call so she could get another abx. Also, she states she hasn't heard anything from the referral ABC was putting in for her. CB# [phone]

## 2018-03-20 ENCOUNTER — Encounter: Payer: Self-pay | Admitting: Obstetrics & Gynecology

## 2018-03-20 ENCOUNTER — Ambulatory Visit (INDEPENDENT_AMBULATORY_CARE_PROVIDER_SITE_OTHER): Payer: BLUE CROSS/BLUE SHIELD | Admitting: Obstetrics & Gynecology

## 2018-03-20 VITALS — BP 100/60 | Ht 60.0 in | Wt 118.0 lb

## 2018-03-20 DIAGNOSIS — N920 Excessive and frequent menstruation with regular cycle: Secondary | ICD-10-CM

## 2018-03-20 DIAGNOSIS — D219 Benign neoplasm of connective and other soft tissue, unspecified: Secondary | ICD-10-CM

## 2018-03-20 NOTE — Progress Notes (Signed)
HPI:      [name] is a 42 y.o. [id number] who LMP was Patient's last menstrual period was 03/07/2018., presents today for a problem visit.  She complains of menorrhagia that  began several months ago and its severity is described as moderate.  She has regular periods every 21 days and they are associated with moderate menstrual cramping.  Her menses are lasting 5 days, very heavy for 3 days, changing pads/tampons Q30-60 min. Had WNL CBC 2018.  She does not have intermenstrual bleeding.  Sex activity: single partner, contraception - tubal ligation.  Last Pap: 02/05/16  Results were: no abnormalities /neg HPV DNA   Previous evaluation: none. Prior Diagnosis: she has [place] from 2017 bshowing one 2 cm fibroid. Previous Treatment: none.  PMHx: She  has a past medical history of Anxiety and depression, Asthma, Bacterial vaginosis, Medical history non-contributory, and Migraine. Also,  has a past surgical history that includes Tubal ligation; Appendectomy; and Esophagogastroduodenoscopy (egd) with propofol (N/A, 10/05/2015)., family history includes Breast cancer in her maternal aunt; Diabetes in her father; Hypercholesterolemia in her father; Hypertension in her mother; Ovarian cancer (age of onset: 57) in her maternal aunt; Pancreatic cancer (age of onset: 21) in her maternal grandmother.,  reports that she has never smoked. She has never used smokeless tobacco. She reports that she does not drink alcohol or use drugs.  She  Current Outpatient Medications:  .  Cholecalciferol (VITAMIN D-1000 MAX ST) 1000 units tablet, Take by mouth., Disp: , Rfl:  .  clindamycin (CLEOCIN) 2 % vaginal cream, Place 1 Applicatorful vaginally at bedtime for 7 days., Disp: 40 g, Rfl: 0 .  ferrous sulfate 325 (65 FE) MG EC tablet, Take 325 mg by mouth daily with breakfast., Disp: , Rfl:  .  SUMAtriptan (IMITREX) 25 MG tablet, Take 1 tablet by mouth at migraine onset. May repeat in 2 hours if headache persists or recurs.,  Disp: 10 tablet, Rfl: 0 .  vitamin B-12 (CYANOCOBALAMIN) 500 MCG tablet, Take 500 mcg by mouth daily., Disp: , Rfl:  .  diclofenac (VOLTAREN) 75 MG EC tablet, Take 1 tablet (75 mg total) by mouth 2 (two) times daily as needed for moderate pain. (Patient not taking: Reported on 03/03/2018), Disp: 60 tablet, Rfl: 0  Also, is allergic to ginger.  Review of Systems  Constitutional: Negative for chills, fever and malaise/fatigue.  HENT: Negative for congestion, sinus pain and sore throat.   Eyes: Negative for blurred vision and pain.  Respiratory: Negative for cough and wheezing.   Cardiovascular: Negative for chest pain and leg swelling.  Gastrointestinal: Negative for abdominal pain, constipation, diarrhea, heartburn, nausea and vomiting.  Genitourinary: Negative for dysuria, frequency, hematuria and urgency.  Musculoskeletal: Negative for back pain, joint pain, myalgias and neck pain.  Skin: Negative for itching and rash.  Neurological: Negative for dizziness, tremors and weakness.  Endo/Heme/Allergies: Does not bruise/bleed easily.  Psychiatric/Behavioral: Negative for depression. The patient is not nervous/anxious and does not have insomnia.     Objective: BP 100/60   Ht 5' (1.524 m)   Wt 118 lb (53.5 kg)   LMP 03/07/2018   BMI 23.05 kg/m  Physical Exam  Constitutional: She is oriented to person, place, and time. She appears well-developed and well-nourished. No distress.  Musculoskeletal: Normal range of motion.  Neurological: She is alert and oriented to person, place, and time.  Skin: Skin is warm and dry.  Psychiatric: She has a normal mood and affect.  Vitals reviewed.  ASSESSMENT/PLAN:  menorrhagia now 21 (vs 28 days before), also new is heavy flow and pain She has a history of fibroid on 2017 [place] although pt did not know about that    Menorrhagia with regular cycle    -  Primary   Relevant Orders   US PELVIS TRANSVANGINAL NON-OB (TV ONLY)   Fibroid       Relevant  Orders   US PELVIS TRANSVANGINAL NON-OB (TV ONLY)    Patient has abnormal uterine bleeding . She has a normal exam today, with no evidence of lesions.  Evaluation includes the following: exam, labs such as hormonal testing, and pelvic ultrasound to evaluate for any structural gynecologic abnormalities.  Patient to follow up after testing.  May need EMB as well if ablation considered.  Treatment option for menorrhagia or menometrorrhagia discussed in great detail with the patient.  Options include hormonal therapy, IUD therapy such as Mirena, D&C, Ablation, and Hysterectomy.  The pros and cons of each option discussed with patient.  Barnett Applebaum, MD, [facility], [facility] 03/20/2018  11:54 AM

## 2018-03-20 NOTE — Patient Instructions (Signed)
Endometrial Ablation Endometrial ablation is a procedure that destroys the thin inner layer of the lining of the uterus (endometrium). This procedure may be done:  To stop heavy periods.  To stop bleeding that is causing anemia.  To control irregular bleeding.  To treat bleeding caused by small tumors (fibroids) in the endometrium.  This procedure is often an alternative to major surgery, such as removal of the uterus and cervix (hysterectomy). As a result of this procedure:  You may not be able to have children. However, if you are premenopausal (you have not gone through menopause): ? You may still have a small chance of getting pregnant. ? You will need to use a reliable method of birth control after the procedure to prevent pregnancy.  You may stop having a menstrual period, or you may have only a small amount of bleeding during your period. Menstruation may return several years after the procedure.  Tell a health care provider about:  Any allergies you have.  All medicines you are taking, including vitamins, herbs, eye drops, creams, and over-the-counter medicines.  Any problems you or family members have had with the use of anesthetic medicines.  Any blood disorders you have.  Any surgeries you have had.  Any medical conditions you have. What are the risks? Generally, this is a safe procedure. However, problems may occur, including:  A hole (perforation) in the uterus or bowel.  Infection of the uterus, bladder, or vagina.  Bleeding.  Damage to other structures or organs.  An air bubble in the lung (air embolus).  Problems with pregnancy after the procedure.  Failure of the procedure.  Decreased ability to diagnose cancer in the endometrium.  What happens before the procedure?  You will have tests of your endometrium to make sure there are no pre-cancerous cells or cancer cells present.  You may have an ultrasound of the uterus.  You may be given  medicines to thin the endometrium.  Ask your health care provider about: ? Changing or stopping your regular medicines. This is especially important if you take diabetes medicines or blood thinners. ? Taking medicines such as aspirin and ibuprofen. These medicines can thin your blood. Do not take these medicines before your procedure if your doctor tells you not to.  Plan to have someone take you home from the hospital or clinic. What happens during the procedure?  You will lie on an exam table with your feet and legs supported as in a pelvic exam.  To lower your risk of infection: ? Your health care team will wash or sanitize their hands and put on germ-free (sterile) gloves. ? Your genital area will be washed with soap.  An IV tube will be inserted into one of your veins.  You will be given a medicine to help you relax (sedative).  A surgical instrument with a light and camera (resectoscope) will be inserted into your vagina and moved into your uterus. This allows your surgeon to see inside your uterus.  Endometrial tissue will be removed using one of the following methods: ? Radiofrequency. This method uses a radiofrequency-alternating electric current to remove the endometrium. ? Cryotherapy. This method uses extreme cold to freeze the endometrium. ? Heated-free liquid. This method uses a heated saltwater (saline) solution to remove the endometrium. ? Microwave. This method uses high-energy microwaves to heat up the endometrium and remove it. ? Thermal balloon. This method involves inserting a catheter with a balloon tip into the uterus. The balloon tip is   filled with heated fluid to remove the endometrium. The procedure may vary among health care providers and hospitals. What happens after the procedure?  Your blood pressure, heart rate, breathing rate, and blood oxygen level will be monitored until the medicines you were given have worn off.  As tissue healing occurs, you may  notice vaginal bleeding for 4-6 weeks after the procedure. You may also experience: ? Cramps. ? Thin, watery vaginal discharge that is light pink or brown in color. ? A need to urinate more frequently than usual. ? Nausea.  Do not drive for 24 hours if you were given a sedative.  Do not have sex or insert anything into your vagina until your health care provider approves. Summary  Endometrial ablation is done to treat the many causes of heavy menstrual bleeding.  The procedure may be done only after medications have been tried to control the bleeding.  Plan to have someone take you home from the hospital or clinic. This information is not intended to replace advice given to you by your health care provider. Make sure you discuss any questions you have with your health care provider. Document Released: 03/22/2004 Document Revised: 05/30/2016 Document Reviewed: 05/30/2016 Elsevier Interactive Patient Education  2017 [employer]  

## 2018-03-26 ENCOUNTER — Telehealth: Payer: Self-pay

## 2018-03-26 DIAGNOSIS — N760 Acute vaginitis: Principal | ICD-10-CM

## 2018-03-26 DIAGNOSIS — B9689 Other specified bacterial agents as the cause of diseases classified elsewhere: Secondary | ICD-10-CM

## 2018-03-26 NOTE — Telephone Encounter (Signed)
Please advise 

## 2018-03-26 NOTE — Telephone Encounter (Signed)
Called pt and left msg with her daughter to call back.

## 2018-03-26 NOTE — Telephone Encounter (Signed)
Pt was rx'd medication for inf, it's the cream and not the gel that was discussed.  Pt would like the gel form of medication.  [phone]

## 2018-03-26 NOTE — Telephone Encounter (Signed)
Pls let pt know cream sent in since she had just treated with gel and BV sx recurred so quickly. They are different abx and some bacteria are resistant to the gel medication.

## 2018-03-31 ENCOUNTER — Other Ambulatory Visit (HOSPITAL_COMMUNITY)
Admission: RE | Admit: 2018-03-31 | Discharge: 2018-03-31 | Disposition: A | Discharge status: Home / Self Care | Payer: BLUE CROSS/BLUE SHIELD | Source: Ambulatory Visit | Attending: Obstetrics & Gynecology | Admitting: Obstetrics & Gynecology

## 2018-03-31 ENCOUNTER — Encounter: Payer: Self-pay | Admitting: Obstetrics & Gynecology

## 2018-03-31 ENCOUNTER — Ambulatory Visit (INDEPENDENT_AMBULATORY_CARE_PROVIDER_SITE_OTHER): Payer: BLUE CROSS/BLUE SHIELD | Admitting: Obstetrics & Gynecology

## 2018-03-31 ENCOUNTER — Ambulatory Visit (INDEPENDENT_AMBULATORY_CARE_PROVIDER_SITE_OTHER): Payer: BLUE CROSS/BLUE SHIELD

## 2018-03-31 VITALS — BP 120/80 | Ht 60.0 in | Wt 118.0 lb

## 2018-03-31 DIAGNOSIS — D252 Subserosal leiomyoma of uterus: Secondary | ICD-10-CM | POA: Diagnosis not present

## 2018-03-31 DIAGNOSIS — D219 Benign neoplasm of connective and other soft tissue, unspecified: Secondary | ICD-10-CM

## 2018-03-31 DIAGNOSIS — N920 Excessive and frequent menstruation with regular cycle: Secondary | ICD-10-CM | POA: Diagnosis not present

## 2018-03-31 MED ORDER — MISOPROSTOL 200 MCG PO TABS: 200.0000 ug | ORAL_TABLET | Freq: Once | ORAL | 0 refills | Status: DC

## 2018-03-31 MED ORDER — CLINDAMYCIN HCL 300 MG PO CAPS: 300.0000 mg | ORAL_CAPSULE | Freq: Two times a day (BID) | ORAL | 0 refills | Status: AC

## 2018-03-31 MED ORDER — OXYCODONE-ACETAMINOPHEN 5-325 MG PO TABS: 1.0000 | ORAL_TABLET | ORAL | 0 refills | Status: DC | PRN

## 2018-03-31 MED ORDER — IBUPROFEN 600 MG PO TABS: 600.0000 mg | ORAL_TABLET | Freq: Four times a day (QID) | ORAL | 3 refills | Status: DC | PRN

## 2018-03-31 MED ORDER — DIAZEPAM 5 MG PO TABS: 5.0000 mg | ORAL_TABLET | Freq: Once | ORAL | 0 refills | Status: AC

## 2018-03-31 MED ORDER — PROMETHAZINE HCL 25 MG PO TABS: 25.0000 mg | ORAL_TABLET | Freq: Once | ORAL | 0 refills | Status: DC

## 2018-03-31 NOTE — Patient Instructions (Addendum)
  Endometrial Ablation Pre-Procedural Instructions for Patient   You may have a light meal prior to coming to the office if desired.    You can plan on your appointment taking about 1 hour.  The actual procedure lasts for 1/2 hour but you will need to remain in the office for a short period after the procedure.    You may feel drowsy from the medication administered prior to and during the procedure. You should arrange for transportation to and from the office.    The following medications have been prescribed to you.  Please follow the doctor's instructions in taking these medications:  Day before Procedure    Cytotec 200 mg by mouth at bedtime    Ibuprofen 800 mg one by mouth three times a day for 2 days before procedure    Day of Procedure  Phenergan 25 mg  by mouth 1 hour before procedure Percocet  2 by mouth 1 hour before procedure Valium 5mg   1 by mouth 1 hour before procedure Percocet 5/325 mg 1 to 2 by mouth every 4-5 hours as needed for pain Ibuprofen 800 mg 1 by mouth every 8 hours as needed for pain

## 2018-03-31 NOTE — Telephone Encounter (Signed)
Pt would like a call back.  [phone]

## 2018-03-31 NOTE — Progress Notes (Signed)
HPI: Pain and bleeding.  She complains of menorrhagia that  began several months ago and its severity is described as moderate.  She has regular periods every 21 days and they are associated with moderate menstrual cramping. Her menses are lasting 5 days, very heavy for 3 days, changing pads/tampons Q30-60 min. Had WNL CBC 2018.  She does nothave intermenstrual bleeding. Sex activity: single partner, contraception - tubal ligation.  Last Pap: 02/05/16 Results were: no abnormalities/neg HPV DNA   Previous evaluation: none. Prior Diagnosis: she has [place] from 2017 showing one 2 cm fibroid.  Ultrasound today demonstrates 2 fibroids, see below  PMHx: She  has a past medical history of Anxiety and depression, Asthma, Bacterial vaginosis, Medical history non-contributory, and Migraine. Also,  has a past surgical history that includes Tubal ligation; Appendectomy; and Esophagogastroduodenoscopy (egd) with propofol (N/A, 10/05/2015)., family history includes Breast cancer in her maternal aunt; Diabetes in her father; Hypercholesterolemia in her father; Hypertension in her mother; Ovarian cancer (age of onset: 45) in her maternal aunt; Pancreatic cancer (age of onset: 27) in her maternal grandmother.,  reports that she has never smoked. She has never used smokeless tobacco. She reports that she does not drink alcohol or use drugs.  She has a current medication list which includes the following prescription(s): cholecalciferol, clindamycin, diclofenac, ferrous sulfate, sumatriptan, and vitamin b-12. Also, is allergic to ginger.  Review of Systems  All other systems reviewed and are negative.  Objective: BP 120/80   Ht 5' (1.524 m)   Wt 118 lb (53.5 kg)   LMP 03/07/2018   BMI 23.05 kg/m   Physical examination Constitutional NAD, Conversant  Skin No rashes, lesions or ulceration.   Extremities: Moves all appropriately.  Normal ROM for age. No lymphadenopathy.  Neuro: Grossly intact  Psych: Oriented  to PPT.  Normal mood. Normal affect.   US Pelvis Transvanginal Non-ob (tv Only)  Result Date: 03/31/2018 Patient Name: Cassandra Pham DOB: 07-28-1975 MRN: [id number] ULTRASOUND REPORT Location: [facility] Date of Service: 03/31/2018 Indications: Fibroid Findings: The uterus is anteverted and measures 8.4 x 4.8 x 4.5cm. Echo texture is homogenous with evidence of focal masses. Within the uterus are two suspected fibroids measuring: Fibroid 1:  2.3 x 1.9 x 1.4cm (RT, SS) Fibroid 2:  1.6 x 1.7 x 1.1cm (RT, SS vs pedunculated) The Endometrium measures 5.6 mm. Right Ovary measures 2.0 x 1.4 x 1.3 cm. It is normal in appearance. Left Ovary measures 2.3 x 1.4 x 1.1 cm. It is normal in appearance. Survey of the adnexa demonstrates no adnexal masses. There is no free fluid in the cul de sac. Impression: 1. Two fibroids seen. Otherwise normal gyn ultrasound. Recommendations: 1.Clinical correlation with the patient's History and Physical Exam. Vita Barley, RDMS RVT Review of ULTRASOUND.    I have personally reviewed images and report of recent ultrasound done at [facility].    Plan of management to be discussed with patient. Barnett Applebaum, MD, [facility], [facility] 03/31/2018  3:24 PM   Assessment:  Fibroid  Menorrhagia with regular cycle   Endometrial Biopsy After discussion with the patient regarding her abnormal uterine bleeding I recommended that she proceed with an endometrial biopsy for further diagnosis. The risks, benefits, alternatives, and indications for an endometrial biopsy were discussed with the patient in detail. She understood the risks including infection, bleeding, cervical laceration and uterine perforation.  Verbal consent was obtained.   PROCEDURE NOTE:  [place] endometrial biopsy was performed using  aseptic technique with iodine preparation.  The uterus was sounded to a length of 7 cm.  Adequate sampling was obtained with minimal blood loss.  The patient  tolerated the procedure well.  Disposition will be pending pathology.  Treatment options discussed for bleeding and for fibroids    She may consider hyst in future but not now    She is counseled on less effectiveness of ablation in instance of fibroids, but still an options and she is good candidate  Patient was told that it is normal to have menstrual bleeding after an endometrial ablation, only about 40% of patients become amenorrheic, 50% of patients have normal or light periods, and 10% of patients have no change in their bleeding pattern and may need further intervention.  She was told she will observe her periods for a few months after her ablation to see what her periods will be like; it is recommended to wait until at least three months after the procedure before making conclusions about how periods are going to be like after an ablation.  Barnett Applebaum, MD, [facility], [facility] 03/31/2018  3:40 PM

## 2018-03-31 NOTE — Telephone Encounter (Signed)
Pt says after calling pharmacy she does recall getting the gel Maebelle Munroe), after I clearly mentioned it to her earlier on our conversations in which she insisted it was a pill she picked up, not a gel. She says it did not work and she would like to get the gel you prescribed before because that one worked. Please advise.

## 2018-03-31 NOTE — Telephone Encounter (Signed)
Pt returning call.  Please call [phone].

## 2018-03-31 NOTE — Telephone Encounter (Signed)
Spoke with pt. Pt treated with nuvessa (metronidazole gel) 03/03/18 without sx relief. Pt having sx again. Rx cleocin crm eRxd. Pt doesn't like cream. Explained need to change abx due to sx recurrence and that metrogel and nuvessa are same ingredient. Pt understands. Will do clindamycin oral BID for 7 days instead of cream. Add boric acid supp. F/u prn.

## 2018-03-31 NOTE — Telephone Encounter (Signed)
Pt says she never received gel to be treated. She was given one pill for a one time dose. I called pharmacy on [address] to confirm and pharmacist stated she picked up Nuvessa gel on 10/9. Called pt back and she insisted she never got the gel. She mentioned the lady who was giving ringing her up had trouble because of her insurance. She said she will call pharmacy.

## 2018-04-01 ENCOUNTER — Telehealth: Payer: Self-pay | Admitting: Obstetrics & Gynecology

## 2018-04-01 NOTE — Telephone Encounter (Signed)
Patient was scheduled yesterday while in the office. I contacted the patient this morning to let her know that I spoke to [facility], who said patient would be responsible for copay only, that deductible and coinsurance do not apply, Ref# [id number]. Patient is aware of cost, and of [employer] disclaimer that if the information given was incorrect, [employer] will pay according to the plan.

## 2018-04-01 NOTE — Telephone Encounter (Signed)
-----   Message from Gae Dry, MD sent at 03/31/2018  3:54 PM EST ----- Regarding: [place] Surgery Booking Request Patient Full Name:  Cassandra Pham  MRN: [id number]  DOB: 03/14/1976  Surgeon: Hoyt Koch, MD  Requested Surgery Date and Time: 04/10/18 Primary Diagnosis AND Code: Menorrhagia, Pelvic Pain Secondary Diagnosis and Code:  Surgical Procedure: In office ablation w hysteroscopy L&D Notification: No Admission Status: Office Length of Surgery: 30 min Special Case Needs: Minerva H&P: no (date) (done)

## 2018-04-01 NOTE — Telephone Encounter (Signed)
-----   Message from Gae Dry, MD sent at 04/01/2018  7:56 AM EST ----- Regarding: RE: [place] On 11/15, the procedure is in My schedule fro 9:00, and I also have a 9:00 annual then.  Can we move my 9:00 annual to 1120 or 1140 as I cannot have time to do both procedure and these longer visits at the same time.  Also prefer to move 920 ROB also to later time (1120 or 1140)  ----- Message ----- From: [place] Lodge Sent: 03/31/2018   4:21 PM EST To: Gae Dry, MD, Otila Kluver, LPN Subject: FW: IN OFFICE ABLATION                         04/10/18 @ 8:30am w/ RPH ----- Message ----- From: Gae Dry, MD Sent: 03/31/2018   3:54 PM EST To: [place] Lodge Subject: IN OFFICE ABLATION                             Surgery Booking Request Patient Full Name:  KENNIS BUELL  MRN: [id number]  DOB: Jun 07, 1975  Surgeon: Hoyt Koch, MD  Requested Surgery Date and Time: 04/10/18 Primary Diagnosis AND Code: Menorrhagia, Pelvic Pain Secondary Diagnosis and Code:  Surgical Procedure: In office ablation w hysteroscopy L&D Notification: No Admission Status: Office Length of Surgery: 30 min Special Case Needs: Minerva H&P: no (date) (done)

## 2018-04-07 DIAGNOSIS — R202 Paresthesia of skin: Secondary | ICD-10-CM | POA: Diagnosis not present

## 2018-04-07 DIAGNOSIS — R200 Anesthesia of skin: Secondary | ICD-10-CM | POA: Diagnosis not present

## 2018-04-08 DIAGNOSIS — R200 Anesthesia of skin: Secondary | ICD-10-CM | POA: Insufficient documentation

## 2018-04-10 ENCOUNTER — Ambulatory Visit (INDEPENDENT_AMBULATORY_CARE_PROVIDER_SITE_OTHER): Payer: BLUE CROSS/BLUE SHIELD | Admitting: Obstetrics & Gynecology

## 2018-04-10 ENCOUNTER — Encounter: Payer: Self-pay | Admitting: Obstetrics & Gynecology

## 2018-04-10 VITALS — BP 120/80 | HR 98 | Ht 60.0 in | Wt 121.0 lb

## 2018-04-10 DIAGNOSIS — N920 Excessive and frequent menstruation with regular cycle: Secondary | ICD-10-CM

## 2018-04-10 DIAGNOSIS — D252 Subserosal leiomyoma of uterus: Secondary | ICD-10-CM

## 2018-04-10 DIAGNOSIS — D219 Benign neoplasm of connective and other soft tissue, unspecified: Secondary | ICD-10-CM

## 2018-04-10 NOTE — Patient Instructions (Addendum)
    Endometrial Ablation Post-Procedural Instructions for Patient   You may experience mild to moderate cramping (like menstrual cramping) and pinkish watery discharge.  This may last approximately 2 to 3 weeks. Use pads, not tampons during this time.  No sexual activity for 3 weeks post procedure.  Follow up medications and directions for taking the medications are    PERCOCET 1 or 2 by mouth every 4 to 6 hours as needed IBUPROFEN 800 mg by mouth three times a day for the next two days PHENERGAN 25 mg by mouth q 6 hours for nausea       Call our office if you develop any of the following: ? Fever of 100.4 or greater  ? Worsening pelvic pain  ? Nausea  ? Vomiting  ? Greenish vaginal discharge with odor   Office phone number: [phone]

## 2018-04-10 NOTE — Progress Notes (Signed)
In Office Procedure Note   04/10/2018  PRE-OP DIAGNOSIS: Menorrhagia, Fibroid  POST-OP DIAGNOSIS: same   SURGEON: Barnett Applebaum, MD, FACOG   PROCEDURE: Hysteroscopy D&C with Endometrial Ablation  ANESTHESIA: Local  ESTIMATED BLOOD LOSS: min   SPECIMENS: none  FLUID DEFICIT: min   COMPLICATIONS: none   DISPOSITION: hemodynamically stable.   CONDITION: stable   FINDINGS: Exam revealed small, mobile small uterus with no masses and bilateral adnexa without masses or fullness. Hysteroscopy revealed no SM fibroids or polyps, otherwise grossly normal appearing uterine cavity with bilateral tubal ostia and normal appearing endocervical canal.   PROCEDURE IN DETAIL: After informed consent was obtained, the patient was taken to the operating room where anesthesia was obtained without difficulty. The patient was positioned in the dorsal lithotomy position in [place]. The patient was examined under anesthesia, with the above noted findings. The weightedspeculum was placed inside the patient's vagina, and the the anterior lip of the cervix was seen and grasped with the tenaculum. An Endocervical specimen was obtained with a kevorkian curette. The uterine cavity was sounded to 8cm, and then the cervix was progressively dilated to a 16 French-Pratt dilator. The 30 degree hysteroscope was introduced, with LR fluid used to distend the intrauterine cavity, with the above noted findings.   The hystersocope was removed.  The Minerva endometrial ablation device was then placed without difficulty. Measurements were obtained. Procedure done. The device is first tested and after confirmation the procedures performed. Length of procedure was 120  seconds. The ablation device is then removed.   Tenaculum was removed with excellent hemostasis noted. She was then taken out of dorsal lithotomy. Minimal discrepancy in fluid was noted. No bleeding.   The patient tolerated the procedure well. Sponge, lap and  needle counts were correct x2. The patient was taken to recovery room in excellent condition.  Barnett Applebaum, MD, [facility], [facility] 04/10/2018  8:50 AM

## 2018-04-15 DIAGNOSIS — R202 Paresthesia of skin: Secondary | ICD-10-CM | POA: Diagnosis not present

## 2018-04-15 DIAGNOSIS — R200 Anesthesia of skin: Secondary | ICD-10-CM | POA: Diagnosis not present

## 2018-05-12 ENCOUNTER — Encounter: Payer: Self-pay | Admitting: Obstetrics & Gynecology

## 2018-05-12 ENCOUNTER — Ambulatory Visit (INDEPENDENT_AMBULATORY_CARE_PROVIDER_SITE_OTHER): Payer: BLUE CROSS/BLUE SHIELD | Admitting: Obstetrics & Gynecology

## 2018-05-12 VITALS — BP 100/60 | Ht 60.0 in | Wt 120.0 lb

## 2018-05-12 DIAGNOSIS — N920 Excessive and frequent menstruation with regular cycle: Secondary | ICD-10-CM

## 2018-05-12 DIAGNOSIS — D219 Benign neoplasm of connective and other soft tissue, unspecified: Secondary | ICD-10-CM | POA: Diagnosis not present

## 2018-05-12 NOTE — Progress Notes (Signed)
  Postoperative Follow-up Patient presents post op from Ablation for abnormal uterine bleeding, 1 month ago.  Subjective: Patient reports marked improvement in her preop symptoms. Eating a regular diet without difficulty. The patient is not having any pain.  Activity: normal activities of daily living. Patient reports additional symptom's since surgery of No period this month (was due 12/2)  Objective: BP 100/60   Ht 5' (1.524 m)   Wt 120 lb (54.4 kg)   BMI 23.44 kg/m  Physical Exam Constitutional:      General: She is not in acute distress.    Appearance: She is well-developed.  Musculoskeletal: Normal range of motion.  Neurological:     Mental Status: She is alert and oriented to person, place, and time.  Skin:    General: Skin is warm and dry.  Vitals signs reviewed.   Assessment: s/p :  endometrial ablation progressing well  Plan: Patient has done well after surgery with no apparent complications.  I have discussed the post-operative course to date, and the expected progress moving forward.  The patient understands what complications to be concerned about.  I will see the patient in routine follow up, or sooner if needed.    Activity plan: No restriction.  Monitor for sx's of fibroids over time  Hoyt Koch 05/12/2018, 4:09 PM

## 2018-08-10 ENCOUNTER — Other Ambulatory Visit: Payer: Self-pay

## 2018-08-10 ENCOUNTER — Ambulatory Visit: Payer: BLUE CROSS/BLUE SHIELD | Admitting: Medical

## 2018-08-10 ENCOUNTER — Encounter: Payer: Self-pay | Admitting: Medical

## 2018-08-10 VITALS — BP 116/76 | HR 90 | Temp 98.3°F | Resp 16 | Wt 120.4 lb

## 2018-08-10 DIAGNOSIS — H10021 Other mucopurulent conjunctivitis, right eye: Secondary | ICD-10-CM

## 2018-08-10 DIAGNOSIS — N3000 Acute cystitis without hematuria: Secondary | ICD-10-CM

## 2018-08-10 DIAGNOSIS — R300 Dysuria: Secondary | ICD-10-CM

## 2018-08-10 LAB — POCT URINALYSIS DIPSTICK
Bilirubin, UA: NEGATIVE
Blood, UA: NEGATIVE
GLUCOSE UA: NEGATIVE
Ketones, UA: NEGATIVE
LEUKOCYTES UA: NEGATIVE
NITRITE UA: NEGATIVE
PROTEIN UA: NEGATIVE
Urobilinogen, UA: 0.2 E.U./dL
pH, UA: 8 (ref 5.0–8.0)

## 2018-08-10 MED ORDER — CIPROFLOXACIN HCL 500 MG PO TABS: 500.0000 mg | ORAL_TABLET | Freq: Two times a day (BID) | ORAL | 0 refills | Status: DC

## 2018-08-10 MED ORDER — GENTAMICIN SULFATE 0.3 % OP SOLN: 1.0000 [drp] | OPHTHALMIC | 0 refills | Status: DC

## 2018-08-10 NOTE — Progress Notes (Signed)
Subjective:    Patient ID: Cassandra Pham, female    DOB: 1976/03/15, 43 y.o.   MRN: [id number]  HPI 43 yo female in non acute distress. Urinary tract infection symptoms starting  on Saturday.dysuria, frequency no urgency , no hematuria denies fever or chills. Denies abdominal pain, does have lower back pain and vaginal pain. Denies fever or chills.  Pink eye exposure  In both eyes of her daughter 53 yo . Blood pressure 116/76, pulse 90, temperature 98.3 F (36.8 C), temperature source Tympanic, resp. rate 16, weight 120 lb 6.4 oz (54.6 kg), SpO2 99 %. Allergies  Allergen Reactions  . Ginger Hives     Review of Systems  Constitutional: Negative for chills, fatigue and fever.  HENT: Negative for congestion, ear pain and sore throat.   Eyes: Positive for pain, discharge, redness and itching. Negative for photophobia and visual disturbance.  Respiratory: Negative for cough.   Cardiovascular: Negative for chest pain.  Gastrointestinal: Positive for abdominal pain (suprapubic).  Genitourinary: Positive for decreased urine volume, difficulty urinating, dysuria, frequency and urgency. Negative for hematuria.  Musculoskeletal: Positive for back pain (lower).  Skin: Negative for rash.  Allergic/Immunologic: Positive for environmental allergies.  Neurological: Positive for dizziness. Negative for syncope, light-headedness and headaches.  Hematological: Positive for adenopathy (preauricular pain on the right side.).  Psychiatric/Behavioral: Negative for behavioral problems, self-injury and suicidal ideas.   Last UTI 3-4 months ago.    Objective:   Physical Exam Vitals signs and nursing note reviewed.  Constitutional:      Appearance: Normal appearance. She is normal weight.  HENT:     Head: Normocephalic and atraumatic.     Comments: Adenopathy noted on right preauricular area    Mouth/Throat:     Mouth: Mucous membranes are moist.  Eyes:     General: Lids are normal.      Right eye: No discharge.        Left eye: No discharge.     Extraocular Movements: Extraocular movements intact.     Pupils: Pupils are equal, round, and reactive to light.     Funduscopic exam:    Right eye: Red reflex present.        Left eye: Red reflex present.    Comments: Erythema on the right sclera, no discharge noted.   Neck:     Musculoskeletal: Normal range of motion and neck supple.  Pulmonary:     Effort: Pulmonary effort is normal.  Abdominal:     General: There is no distension.     Palpations: There is no mass.     Tenderness: There is no abdominal tenderness. There is no right CVA tenderness, left CVA tenderness, guarding or rebound.     Hernia: No hernia is present.  Skin:    General: Skin is warm and dry.  Neurological:     General: No focal deficit present.     Mental Status: She is alert.  Psychiatric:        Mood and Affect: Mood normal.        Behavior: Behavior normal.        Thought Content: Thought content normal.        Judgment: Judgment normal.   visual acuity 20/20 bilateral and each eye, does not werar contacts. Wearing glasses on exam. Recent Results (from the past 2160 hour(s))  POCT Urinalysis Dipstick     Status: Abnormal   Collection Time: 08/10/18  1:44 PM  Result Value Ref Range  Color, UA straw    Clarity, UA clear    Glucose, UA Negative Negative   Bilirubin, UA neg    Ketones, UA neg    Spec Grav, UA <=1.005 (A) 1.010 - 1.025   Blood, UA neg    pH, UA 8.0 5.0 - 8.0   Protein, UA Negative Negative   Urobilinogen, UA 0.2 0.2 or 1.0 E.U./dL   Nitrite, UA neg    Leukocytes, UA Negative Negative   Appearance     Odor    Urine Culture     Status: None   Collection Time: 08/10/18  1:46 PM  Result Value Ref Range   Urine Culture, Routine Final report    Organism ID, Bacteria No growth    Assessment & Plan:  Urinary tract Infection Righ eye conjunctivitis Preauricular node on the right side, most likely due to the eye  infection.  OTC Ibuprofen for pain or swelling of tender node.   OTC Azo x 2 days. As directed on package. Orders Placed This Encounter  Procedures  . Urine Culture  . Visual acuity screening  . POCT Urinalysis Dipstick   Meds ordered this encounter  Medications  . gentamicin (GARAMYCIN) 0.3 % ophthalmic solution    Sig: Place 1 drop into the right eye every 4 (four) hours.    Dispense:  5 mL    Refill:  0  . ciprofloxacin (CIPRO) 500 MG tablet    Sig: Take 1 tablet (500 mg total) by mouth 2 (two) times daily.    Dispense:  14 tablet    Refill:  0  Return to clinic in 3 -5 days if not improving or if worsening. Patient verbalizes understanding and has  No questions at discharge.

## 2018-08-10 NOTE — Progress Notes (Signed)
Visual acuity: R-20/30 w/glasses L-20/30 w/glasses Both- 20/30 w/glasses

## 2018-08-12 ENCOUNTER — Encounter: Payer: Self-pay | Admitting: Medical

## 2018-08-12 LAB — URINE CULTURE: Organism ID, Bacteria: NO GROWTH

## 2018-08-13 ENCOUNTER — Ambulatory Visit: Payer: BLUE CROSS/BLUE SHIELD | Admitting: Medical

## 2018-08-13 ENCOUNTER — Other Ambulatory Visit: Payer: Self-pay

## 2018-08-13 ENCOUNTER — Encounter: Payer: Self-pay | Admitting: Medical

## 2018-08-13 VITALS — BP 101/72 | HR 78 | Temp 98.2°F | Resp 18 | Ht 63.0 in | Wt 117.0 lb

## 2018-08-13 DIAGNOSIS — H1033 Unspecified acute conjunctivitis, bilateral: Secondary | ICD-10-CM | POA: Diagnosis not present

## 2018-08-13 DIAGNOSIS — H5713 Ocular pain, bilateral: Secondary | ICD-10-CM

## 2018-08-13 NOTE — Progress Notes (Signed)
   Subjective:    Patient ID: Cassandra Pham, female    DOB: 23-Apr-1976, 43 y.o.   MRN: [id number]  HPI  Seen on 08/10/2018 diagnosed with pink eye on the right eye and treated with Gentamicin ophthalmic drops.Righ eye has gotten worse and the . Left eye became pink on Tuesday.. Denies visual changes. When waking up in the morning both eyes are crusted. Painful 7/10.   Review of Systems  Constitutional: Negative for chills, fatigue and fever.  Eyes: Positive for pain, discharge, redness and itching. Negative for photophobia and visual disturbance.       Objective:   Physical Exam Constitutional:      Appearance: Normal appearance.  HENT:     Head: Normocephalic and atraumatic.     Mouth/Throat:     Mouth: Mucous membranes are moist.  Eyes:     General: Lids are normal. Vision grossly intact. Gaze aligned appropriately. No scleral icterus.       Right eye: No discharge.        Left eye: No discharge.     Extraocular Movements: Extraocular movements intact.     Pupils: Pupils are equal, round, and reactive to light.     Funduscopic exam:    Right eye: Red reflex present.        Left eye: Red reflex present.     Comments: Bilateral sclera is very injected and shows some Chemosis.  Neck:     Musculoskeletal: Neck supple.  Neurological:     Mental Status: She is alert.    Visual acutiy  left  20/30  Right 20/30 Bilateral 20/30    Assessment & Plan:  Painful eyes, injected sclera and worsening redness Called [facility] scheduled patient  at 1:20 for exam with ophthalmologist.  Given address and phone number of clinic. Patient verbalizes understanding and has no questions at discharge. I asked patient to call me after appointment.

## 2018-12-28 ENCOUNTER — Other Ambulatory Visit: Payer: Self-pay

## 2018-12-28 ENCOUNTER — Ambulatory Visit
Admission: RE | Admit: 2018-12-28 | Discharge: 2018-12-28 | Disposition: A | Discharge status: Home / Self Care | Payer: BC Managed Care – PPO | Source: Ambulatory Visit | Attending: Obstetrics and Gynecology | Admitting: Obstetrics and Gynecology

## 2018-12-28 DIAGNOSIS — Z1231 Encounter for screening mammogram for malignant neoplasm of breast: Secondary | ICD-10-CM | POA: Diagnosis not present

## 2018-12-28 DIAGNOSIS — Z1239 Encounter for other screening for malignant neoplasm of breast: Secondary | ICD-10-CM

## 2018-12-29 ENCOUNTER — Encounter: Payer: Self-pay | Admitting: Obstetrics and Gynecology

## 2019-02-25 DIAGNOSIS — Z8041 Family history of malignant neoplasm of ovary: Secondary | ICD-10-CM

## 2019-02-25 HISTORY — DX: Family history of malignant neoplasm of ovary: Z80.41

## 2019-02-26 DIAGNOSIS — B9689 Other specified bacterial agents as the cause of diseases classified elsewhere: Secondary | ICD-10-CM | POA: Diagnosis not present

## 2019-02-26 DIAGNOSIS — J019 Acute sinusitis, unspecified: Secondary | ICD-10-CM | POA: Diagnosis not present

## 2019-02-26 DIAGNOSIS — Z03818 Encounter for observation for suspected exposure to other biological agents ruled out: Secondary | ICD-10-CM | POA: Diagnosis not present

## 2019-02-26 DIAGNOSIS — Z20828 Contact with and (suspected) exposure to other viral communicable diseases: Secondary | ICD-10-CM | POA: Diagnosis not present

## 2019-03-04 NOTE — Patient Instructions (Signed)
I value your feedback and entrusting us with your care. If you get a [facility] patient survey, I would appreciate you taking the time to let us know about your experience today. Thank you! 

## 2019-03-04 NOTE — Progress Notes (Signed)
PCP:  Pleas Koch, NP   Chief Complaint  Patient presents with  . Gynecologic Exam     HPI:      [name] is a 43 y.o. [id number] who LMP was No LMP recorded. Patient has had an ablation., presents today for her annual examination.  Her menses are regular every 28-30 days, lasting 3 days of spotting/dark d/c only. Dysmenorrhea mild, occurring first 1-2 days of flow. She does not have intermenstrual bleeding. Menses significantly improved after endometrial ablation 12/19 with DR. Harris.  Sex activity: single partner, contraception - tubal ligation.  Last Pap: 02/05/16  Results were: no abnormalities /neg HPV DNA  Hx of STDs: none  Hx of increased d/c and odor without irritation a few days after menses that usually resolves spontaneously. No sx today. She has a hx of BV.  Last mammogram: 12/28/18  Results were: normal--routine follow-up in 12 months There is a FH of breast cancer in her mat aunt. There is a FH of ovarian cancer in her mat aunt and pancreatic cancer in her MGM, genetic testing not done/pt declined past few yrs. The patient does do self-breast exams.  Tobacco use: The patient denies current or previous tobacco use. Alcohol use: social drinker No drug use.  Exercise: not active  She does not get adequate calcium or get Vitamin D in her diet.   Past Medical History:  Diagnosis Date  . Anxiety and depression   . Asthma   . Bacterial vaginosis   . Family history of ovarian cancer 02/2019   MyRisk testing declined  . Family history of pancreatic cancer   . Medical history non-contributory   . Migraine     Past Surgical History:  Procedure Laterality Date  . ABLATION    . APPENDECTOMY    . ESOPHAGOGASTRODUODENOSCOPY (EGD) WITH PROPOFOL N/A 10/05/2015   Procedure: ESOPHAGOGASTRODUODENOSCOPY (EGD) WITH PROPOFOL;  Surgeon: Hulen Luster, MD;  Location: [facility] ENDOSCOPY;  Service: Gastroenterology;  Laterality: N/A;  . TUBAL LIGATION      Family History   Problem Relation Age of Onset  . Hypertension Mother   . Diabetes Father        DM Type 2  . Hypercholesterolemia Father   . Pancreatic cancer Maternal Grandmother 29  . Ovarian cancer Maternal Aunt 59  . Breast cancer Maternal Aunt     Social History   Socioeconomic History  . Marital status: Married    Spouse name: Not on file  . Number of children: Not on file  . Years of education: Not on file  . Highest education level: Not on file  Occupational History  . Not on file  Social Needs  . Financial resource strain: Not on file  . Food insecurity    Worry: Not on file    Inability: Not on file  . Transportation needs    Medical: Not on file    Non-medical: Not on file  Tobacco Use  . Smoking status: Never Smoker  . Smokeless tobacco: Never Used  Substance and Sexual Activity  . Alcohol use: No  . Drug use: No  . Sexual activity: Yes    Birth control/protection: Surgical    Comment: tubal ligation  Lifestyle  . Physical activity    Days per week: Not on file    Minutes per session: Not on file  . Stress: Not on file  Relationships  . Social connections    Talks on phone: Not on file  Gets together: Not on file    Attends religious service: Not on file    Active member of club or organization: Not on file    Attends meetings of clubs or organizations: Not on file    Relationship status: Not on file  . Intimate partner violence    Fear of current or ex partner: Not on file    Emotionally abused: Not on file    Physically abused: Not on file    Forced sexual activity: Not on file  Other Topics Concern  . Not on file  Social History Narrative   Single.   4 children.   Works as a Administrator.   Enjoys spending time with family and friends.     Current Meds  Medication Sig  . ibuprofen (ADVIL,MOTRIN) 600 MG tablet Take 1 tablet (600 mg total) by mouth every 6 (six) hours as needed.     ROS:  Review of Systems  Constitutional: Negative for  fatigue, fever and unexpected weight change.  Respiratory: Negative for cough, shortness of breath and wheezing.   Cardiovascular: Negative for chest pain, palpitations and leg swelling.  Gastrointestinal: Negative for blood in stool, constipation, diarrhea, nausea and vomiting.  Endocrine: Negative for cold intolerance, heat intolerance and polyuria.  Genitourinary: Negative for dyspareunia, dysuria, flank pain, frequency, genital sores, hematuria, menstrual problem, pelvic pain, urgency, vaginal bleeding, vaginal discharge and vaginal pain.  Musculoskeletal: Negative for back pain, joint swelling and myalgias.  Skin: Negative for rash.  Neurological: Negative for dizziness, syncope, light-headedness, numbness and headaches.  Hematological: Negative for adenopathy.  Psychiatric/Behavioral: Negative for agitation, confusion, sleep disturbance and suicidal ideas. The patient is not nervous/anxious.      Objective: BP 100/70   Ht 5' (1.524 m)   Wt 123 lb (55.8 kg)   BMI 24.02 kg/m    Physical Exam Constitutional:      Appearance: She is well-developed.  Genitourinary:     Vulva, vagina, uterus, right adnexa and left adnexa normal.     No vulval lesion or tenderness noted.     No vaginal discharge, erythema or tenderness.     No cervical motion tenderness or polyp.     Uterus is not enlarged or tender.     No right or left adnexal mass present.     Right adnexa not tender.     Left adnexa not tender.  Neck:     Musculoskeletal: Normal range of motion.     Thyroid: No thyromegaly.  Cardiovascular:     Rate and Rhythm: Normal rate and regular rhythm.     Heart sounds: Normal heart sounds. No murmur.  Pulmonary:     Effort: Pulmonary effort is normal.     Breath sounds: Normal breath sounds.  Chest:     Breasts:        Right: No mass, nipple discharge, skin change or tenderness.        Left: No mass, nipple discharge, skin change or tenderness.  Abdominal:     Palpations:  Abdomen is soft.     Tenderness: There is no abdominal tenderness. There is no guarding.  Musculoskeletal: Normal range of motion.  Neurological:     General: No focal deficit present.     Mental Status: She is alert and oriented to person, place, and time.     Cranial Nerves: No cranial nerve deficit.  Skin:    General: Skin is warm and dry.  Psychiatric:  Mood and Affect: Mood normal.        Behavior: Behavior normal.        Thought Content: Thought content normal.        Judgment: Judgment normal.  Vitals signs reviewed.     Assessment/Plan: Encounter for annual routine gynecological examination  Encounter for screening mammogram for malignant neoplasm of breast--pt current on mammo  Family history of ovarian cancer--MyRisk testing discussed and declined.         GYN counsel mammography screening, adequate intake of calcium and vitamin D, diet and exercise     F/U  Return in about 1 year (around 03/07/2020).  Tru Rana B. Charlie Seda, PA-C 03/08/2019 9:33 AM

## 2019-03-08 ENCOUNTER — Encounter: Payer: Self-pay | Admitting: Obstetrics and Gynecology

## 2019-03-08 ENCOUNTER — Ambulatory Visit (INDEPENDENT_AMBULATORY_CARE_PROVIDER_SITE_OTHER): Payer: BC Managed Care – PPO | Admitting: Obstetrics and Gynecology

## 2019-03-08 ENCOUNTER — Other Ambulatory Visit: Payer: Self-pay

## 2019-03-08 VITALS — BP 100/70 | Ht 60.0 in | Wt 123.0 lb

## 2019-03-08 DIAGNOSIS — Z01419 Encounter for gynecological examination (general) (routine) without abnormal findings: Secondary | ICD-10-CM

## 2019-03-08 DIAGNOSIS — Z8041 Family history of malignant neoplasm of ovary: Secondary | ICD-10-CM

## 2019-03-08 DIAGNOSIS — Z1231 Encounter for screening mammogram for malignant neoplasm of breast: Secondary | ICD-10-CM

## 2019-03-31 ENCOUNTER — Other Ambulatory Visit: Payer: Self-pay

## 2019-03-31 ENCOUNTER — Ambulatory Visit: Payer: BC Managed Care – PPO

## 2019-03-31 DIAGNOSIS — Z23 Encounter for immunization: Secondary | ICD-10-CM

## 2019-05-14 ENCOUNTER — Telehealth: Payer: Self-pay

## 2019-05-14 NOTE — Telephone Encounter (Signed)
05/13/2019 Pt call c/o sinus allergies.  Denies any fever, SOB or cough. Instructed pt to take an OTC antihistamine to relieve symptoms.  Suggested for  pt to have Covid testing if symptoms do not improve.  She will call us back tomorrow if her symptoms worsen.

## 2019-06-15 ENCOUNTER — Encounter: Payer: Self-pay | Admitting: Medical

## 2019-06-15 ENCOUNTER — Other Ambulatory Visit: Payer: Self-pay

## 2019-06-15 ENCOUNTER — Telehealth: Payer: BC Managed Care – PPO | Admitting: Medical

## 2019-06-15 DIAGNOSIS — N3000 Acute cystitis without hematuria: Secondary | ICD-10-CM

## 2019-06-15 DIAGNOSIS — R432 Parageusia: Secondary | ICD-10-CM

## 2019-06-15 DIAGNOSIS — M79642 Pain in left hand: Secondary | ICD-10-CM

## 2019-06-15 MED ORDER — CIPROFLOXACIN HCL 500 MG PO TABS: 500.0000 mg | ORAL_TABLET | Freq: Two times a day (BID) | ORAL | 0 refills | Status: DC

## 2019-06-15 MED ORDER — IBUPROFEN 800 MG PO TABS: 800.0000 mg | ORAL_TABLET | Freq: Three times a day (TID) | ORAL | 0 refills | Status: DC | PRN

## 2019-06-15 NOTE — Progress Notes (Signed)
Patient aware of risks and benefits of telemedicine, gives consent for telemedicine appointment.   Fell on Friday, slipped after taking the garbage out , when she turned to go back to her house. Fell on left side, complains of pain in the thumb down to the the wrist on the thumb side. She states she also strained her neck.  Urinary symptoms of frequency, burning after going and foul odor x 2 days. No fever or chills, no back pain. No nausea or vomiting.  Incidentally she states she has lost taste sensation yesterday and wonders if she needs a Covid-19 test. She has had  2  Tests since January 9th when her mother tested postive to Covid-19.   Review of Systems  Constitutional: Negative.   HENT:       Loss of taste yesterday  Respiratory: Negative.   Cardiovascular: Negative.   Gastrointestinal: Negative.   Genitourinary: Positive for dysuria and frequency. Negative for flank pain, hematuria and urgency.  Musculoskeletal: Positive for neck pain.  Neurological: Positive for tingling (left thumb after fall).   Patient is right handed.   A/P lLeft wrist pain s/p fall X-ray ordered Took ibuprofen 800mg  and that did help her pain.Sent prescription over for Ibuprofen 800mg . Reviewed with patient to  Take with food. Continue Ice and elevation. Urinary Tract Infection abx ordered increase water intake. Loss of taste recommend Covid-19 testing at [facility].  Meds ordered this encounter  Medications  . ciprofloxacin (CIPRO) 500 MG tablet    Sig: Take 1 tablet (500 mg total) by mouth 2 (two) times daily.    Dispense:  14 tablet    Refill:  0  . ibuprofen (ADVIL) 800 MG tablet    Sig: Take 1 tablet (800 mg total) by mouth every 8 (eight) hours as needed. Take with food    Dispense:  20 tablet    Refill:  0  Patient to go and get Covid-19 testing pending test result and symptom review, potentially patent may go for x-ray, will dicuss with patient once test results are received. . To isolate  after getting test till call patient once results are received. Also they will be available in [place]. Patient verbalizes understanding and has no questions at discharge. Patient verbalizes understanding and has no questions at the end of our conversation.

## 2019-06-16 ENCOUNTER — Ambulatory Visit: Payer: BC Managed Care – PPO | Attending: Internal Medicine

## 2019-06-16 DIAGNOSIS — Z20822 Contact with and (suspected) exposure to covid-19: Secondary | ICD-10-CM

## 2019-06-17 LAB — NOVEL CORONAVIRUS, NAA: SARS-CoV-2, NAA: NOT DETECTED

## 2019-06-18 ENCOUNTER — Encounter: Payer: Self-pay | Admitting: Medical

## 2019-06-18 ENCOUNTER — Ambulatory Visit
Admission: RE | Admit: 2019-06-18 | Discharge: 2019-06-18 | Disposition: A | Discharge status: Home / Self Care | Payer: BC Managed Care – PPO | Source: Ambulatory Visit | Attending: Medical | Admitting: Medical

## 2019-06-18 ENCOUNTER — Telehealth: Payer: Self-pay | Admitting: Medical

## 2019-06-18 ENCOUNTER — Telehealth: Payer: Self-pay

## 2019-06-18 DIAGNOSIS — M79642 Pain in left hand: Secondary | ICD-10-CM | POA: Diagnosis not present

## 2019-06-18 DIAGNOSIS — S6992XA Unspecified injury of left wrist, hand and finger(s), initial encounter: Secondary | ICD-10-CM | POA: Diagnosis not present

## 2019-06-18 NOTE — Telephone Encounter (Signed)
Called patient , no answer. Left message in MyChart. Covie-19  Test  06/15/2018 negative. Left hand xray wnl no fracture or dislocation, soft tissue wnl. Follow up on Thursday for a recheck, to continue ice , elevation , Ibuprofen 800mg  TID and to wear splint.

## 2019-06-18 NOTE — Telephone Encounter (Signed)
Patient called our office requesting an xray of her wrist due to no improvement.  Patient had a negative Covid test on Monday but c/o loss of taste.  She states she can taste and smell fine at the present time.  She will proceed with getting the xray today.

## 2019-09-02 ENCOUNTER — Encounter: Payer: Self-pay | Admitting: Nurse Practitioner

## 2019-09-02 ENCOUNTER — Other Ambulatory Visit: Payer: Self-pay

## 2019-09-02 ENCOUNTER — Ambulatory Visit: Payer: Self-pay | Admitting: Nurse Practitioner

## 2019-09-02 VITALS — BP 121/67 | HR 75 | Temp 98.0°F | Resp 18 | Ht 61.0 in | Wt 125.0 lb

## 2019-09-02 DIAGNOSIS — M545 Low back pain, unspecified: Secondary | ICD-10-CM

## 2019-09-02 DIAGNOSIS — S39012A Strain of muscle, fascia and tendon of lower back, initial encounter: Secondary | ICD-10-CM

## 2019-09-02 LAB — POCT URINALYSIS DIPSTICK
Appearance: NORMAL
Bilirubin, UA: NEGATIVE
Blood, UA: NEGATIVE
Glucose, UA: NEGATIVE
Ketones, UA: NEGATIVE
Leukocytes, UA: NEGATIVE
Nitrite, UA: NEGATIVE
Protein, UA: NEGATIVE
Spec Grav, UA: 1.01 (ref 1.010–1.025)
Urobilinogen, UA: 0.2 E.U./dL
pH, UA: 6 (ref 5.0–8.0)

## 2019-09-02 MED ORDER — PREDNISONE 10 MG (21) PO TBPK: ORAL_TABLET | ORAL | 0 refills | Status: DC

## 2019-09-02 MED ORDER — IBUPROFEN 800 MG PO TABS: 800.0000 mg | ORAL_TABLET | Freq: Three times a day (TID) | ORAL | 0 refills | Status: DC | PRN

## 2019-09-02 NOTE — Patient Instructions (Addendum)
Start using heat and ice alternating to low back Take all meds as directed; and take 500mg  Tylenol daily. When you have completed prednisone, you have Ibuprofen to take as directed if you need to for pain. I have added the labs we discussed CBC, vitamin d, CMP and they will be released to you via mychart (you will also see a message from me when they all return) If pain isn't resolved or worsens after course of Prednisone please call the clinic for further evaluation and treatment  Acute Back Pain, Adult Acute back pain is sudden and usually short-lived. It is often caused by an injury to the muscles and tissues in the back. The injury may result from:  A muscle or ligament getting overstretched or torn (strained). Ligaments are tissues that connect bones to each other. Lifting something improperly can cause a back strain.  Wear and tear (degeneration) of the spinal disks. Spinal disks are circular tissue that provides cushioning between the bones of the spine (vertebrae).  Twisting motions, such as while playing sports or doing yard work.  A hit to the back.  Arthritis. You may have a physical exam, lab tests, and imaging tests to find the cause of your pain. Acute back pain usually goes away with rest and home care. Follow these instructions at home: Managing pain, stiffness, and swelling  Take over-the-counter and prescription medicines only as told by your health care provider.  Your health care provider may recommend applying ice during the first 24-48 hours after your pain starts. To do this: ? Put ice in a plastic bag. ? Place a towel between your skin and the bag. ? Leave the ice on for 20 minutes, 2-3 times a day.  If directed, apply heat to the affected area as often as told by your health care provider. Use the heat source that your health care provider recommends, such as a moist heat pack or a heating pad. ? Place a towel between your skin and the heat source. ? Leave the  heat on for 20-30 minutes. ? Remove the heat if your skin turns bright red. This is especially important if you are unable to feel pain, heat, or cold. You have a greater risk of getting burned. Activity    Do not stay in bed. Staying in bed for more than 1-2 days can delay your recovery.  Sit up and stand up straight. Avoid leaning forward when you sit, or hunching over when you stand. ? If you work at a desk, sit close to it so you do not need to lean over. Keep your chin tucked in. Keep your neck drawn back, and keep your elbows bent at a right angle. Your arms should look like the letter "L." ? Sit high and close to the steering wheel when you drive. Add lower back (lumbar) support to your car seat, if needed.  Take short walks on even surfaces as soon as you are able. Try to increase the length of time you walk each day.  Do not sit, drive, or stand in one place for more than 30 minutes at a time. Sitting or standing for long periods of time can put stress on your back.  Do not drive or use heavy machinery while taking prescription pain medicine.  Use proper lifting techniques. When you bend and lift, use positions that put less stress on your back: ? [place] your knees. ? Keep the load close to your body. ? Avoid twisting.  Exercise regularly  as told by your health care provider. Exercising helps your back heal faster and helps prevent back injuries by keeping muscles strong and flexible.  Work with a physical therapist to make a safe exercise program, as recommended by your health care provider. Do any exercises as told by your physical therapist. Lifestyle  Maintain a healthy weight. Extra weight puts stress on your back and makes it difficult to have good posture.  Avoid activities or situations that make you feel anxious or stressed. Stress and anxiety increase muscle tension and can make back pain worse. Learn ways to manage anxiety and stress, such as through  exercise. General instructions  Sleep on a firm mattress in a comfortable position. Try lying on your side with your knees slightly bent. If you lie on your back, put a pillow under your knees.  Follow your treatment plan as told by your health care provider. This may include: ? Cognitive or behavioral therapy. ? Acupuncture or massage therapy. ? Meditation or yoga. Contact a health care provider if:  You have pain that is not relieved with rest or medicine.  You have increasing pain going down into your legs or buttocks.  Your pain does not improve after 2 weeks.  You have pain at night.  You lose weight without trying.  You have a fever or chills. Get help right away if:  You develop new bowel or bladder control problems.  You have unusual weakness or numbness in your arms or legs.  You develop nausea or vomiting.  You develop abdominal pain.  You feel faint. Summary  Acute back pain is sudden and usually short-lived.  Use proper lifting techniques. When you bend and lift, use positions that put less stress on your back.  Take over-the-counter and prescription medicines and apply heat or ice as directed by your health care provider. This information is not intended to replace advice given to you by your health care provider. Make sure you discuss any questions you have with your health care provider. Document Revised: 09/01/2018 Document Reviewed: 12/25/2016 Elsevier Patient Education  [place].

## 2019-09-02 NOTE — Progress Notes (Signed)
   Subjective:    Patient ID: Cassandra Pham, female    DOB: 1976/05/25, 44 y.o.   MRN: [id number]  HPI Amiaya is here today with c/o low back pain that is worse on the right but has been x 1 month.She reports pain has worsened over the last 3 days and constant.  She denies injury or hx of low back pain. She reports she takes 800mg  periodically for the pain when she can't tolerate it anymore with relief but symptoms don't completely resolve. She denies any urinary symptoms blood in urine, strong odor, cloudy or burning. She rates pain 10/10 that is dull and "achy". Denies bowel/bladder incontinence or issues, loss of balance, pain that radiates down, numbness or tingling of extremity.  Rubymae and I have discussed no labs on chart since 2018 and I would draw baseline labs and she reports she has resumed iron and will include vitamin d; which she wishes to have labs drawn.  LMP 3/26    Review of Systems  Musculoskeletal: Positive for back pain. Negative for gait problem.  Neurological: Negative for weakness and numbness.       Objective:   Physical Exam Constitutional:      Appearance: Normal appearance.  Musculoskeletal:     Comments: Limited ROM with forward bend and slow to move but otherwise back ROM WNL and strength of bilateral legs 5/5. Tenderness elicited to lateral areas of LS area around 5/6 and worse on right but no grimace noted on palpation  Skin:    General: Skin is warm and dry.  Neurological:     General: No focal deficit present.     Mental Status: She is alert and oriented to person, place, and time.  Psychiatric:        Mood and Affect: Mood normal.           Assessment & Plan:

## 2019-09-03 ENCOUNTER — Telehealth: Payer: Self-pay | Admitting: Nurse Practitioner

## 2019-09-03 DIAGNOSIS — E559 Vitamin D deficiency, unspecified: Secondary | ICD-10-CM

## 2019-09-03 LAB — CMP14+EGFR
ALT: 14 IU/L (ref 0–32)
AST: 17 IU/L (ref 0–40)
Albumin/Globulin Ratio: 1.6 (ref 1.2–2.2)
Albumin: 4.4 g/dL (ref 3.8–4.8)
Alkaline Phosphatase: 61 IU/L (ref 39–117)
BUN/Creatinine Ratio: 10 (ref 9–23)
BUN: 9 mg/dL (ref 6–24)
Bilirubin Total: 0.6 mg/dL (ref 0.0–1.2)
CO2: 22 mmol/L (ref 20–29)
Calcium: 9.3 mg/dL (ref 8.7–10.2)
Chloride: 105 mmol/L (ref 96–106)
Creatinine, Ser: 0.86 mg/dL (ref 0.57–1.00)
GFR calc Af Amer: 94 mL/min/{1.73_m2} (ref >59)
GFR calc non Af Amer: 82 mL/min/{1.73_m2} (ref >59)
Globulin, Total: 2.8 g/dL (ref 1.5–4.5)
Glucose: 79 mg/dL (ref 65–99)
Potassium: 4.4 mmol/L (ref 3.5–5.2)
Sodium: 141 mmol/L (ref 134–144)
Total Protein: 7.2 g/dL (ref 6.0–8.5)

## 2019-09-03 LAB — VITAMIN D 25 HYDROXY (VIT D DEFICIENCY, FRACTURES): Vit D, 25-Hydroxy: 19.5 ng/mL — ABNORMAL LOW (ref 30.0–100.0)

## 2019-09-03 LAB — CBC WITH DIFFERENTIAL/PLATELET
Basophils Absolute: 0 10*3/uL (ref 0.0–0.2)
Basos: 0 %
EOS (ABSOLUTE): 0.4 10*3/uL (ref 0.0–0.4)
Eos: 7 %
Hematocrit: 38.2 % (ref 34.0–46.6)
Hemoglobin: 12.8 g/dL (ref 11.1–15.9)
Immature Grans (Abs): 0 10*3/uL (ref 0.0–0.1)
Immature Granulocytes: 0 %
Lymphocytes Absolute: 1.9 10*3/uL (ref 0.7–3.1)
Lymphs: 31 %
MCH: 31.9 pg (ref 26.6–33.0)
MCHC: 33.5 g/dL (ref 31.5–35.7)
MCV: 95 fL (ref 79–97)
Monocytes Absolute: 0.4 10*3/uL (ref 0.1–0.9)
Monocytes: 6 %
Neutrophils Absolute: 3.4 10*3/uL (ref 1.4–7.0)
Neutrophils: 56 %
Platelets: 302 10*3/uL (ref 150–450)
RBC: 4.01 x10E6/uL (ref 3.77–5.28)
RDW: 12.7 % (ref 11.7–15.4)
WBC: 6.2 10*3/uL (ref 3.4–10.8)

## 2019-09-03 MED ORDER — VITAMIN D (ERGOCALCIFEROL) 1.25 MG (50000 UNIT) PO CAPS: 50000.0000 [IU] | ORAL_CAPSULE | ORAL | 0 refills | Status: DC

## 2019-09-03 NOTE — Telephone Encounter (Signed)
See 09/02/2019 lab note.

## 2019-09-09 ENCOUNTER — Encounter: Payer: Self-pay | Admitting: Nurse Practitioner

## 2019-09-09 ENCOUNTER — Other Ambulatory Visit: Payer: Self-pay

## 2019-09-09 ENCOUNTER — Ambulatory Visit: Payer: BC Managed Care – PPO | Admitting: Nurse Practitioner

## 2019-09-09 VITALS — BP 122/79 | HR 76 | Temp 98.1°F | Resp 16 | Ht 60.0 in | Wt 126.0 lb

## 2019-09-09 DIAGNOSIS — M545 Low back pain, unspecified: Secondary | ICD-10-CM

## 2019-09-09 DIAGNOSIS — S39012S Strain of muscle, fascia and tendon of lower back, sequela: Secondary | ICD-10-CM

## 2019-09-09 MED ORDER — TIZANIDINE HCL 4 MG PO TABS: 4.0000 mg | ORAL_TABLET | Freq: Four times a day (QID) | ORAL | 0 refills | Status: DC | PRN

## 2019-09-09 NOTE — Patient Instructions (Signed)
Cassandra Pham please consider rotating heat/ice to back I am referring you to PT to see if they can resolve the low back pain I want you to start the Ibuprofen three times a day x 7 days with food and the muscle relaxer at least at night Stretching of the area with some of the movements done in the office if you can tolerated it Return to the clinic as needed  Acute Back Pain, Adult Acute back pain is sudden and usually short-lived. It is often caused by an injury to the muscles and tissues in the back. The injury may result from:  A muscle or ligament getting overstretched or torn (strained). Ligaments are tissues that connect bones to each other. Lifting something improperly can cause a back strain.  Wear and tear (degeneration) of the spinal disks. Spinal disks are circular tissue that provides cushioning between the bones of the spine (vertebrae).  Twisting motions, such as while playing sports or doing yard work.  A hit to the back.  Arthritis. You may have a physical exam, lab tests, and imaging tests to find the cause of your pain. Acute back pain usually goes away with rest and home care. Follow these instructions at home: Managing pain, stiffness, and swelling  Take over-the-counter and prescription medicines only as told by your health care provider.  Your health care provider may recommend applying ice during the first 24-48 hours after your pain starts. To do this: ? Put ice in a plastic bag. ? Place a towel between your skin and the bag. ? Leave the ice on for 20 minutes, 2-3 times a day.  If directed, apply heat to the affected area as often as told by your health care provider. Use the heat source that your health care provider recommends, such as a moist heat pack or a heating pad. ? Place a towel between your skin and the heat source. ? Leave the heat on for 20-30 minutes. ? Remove the heat if your skin turns bright red. This is especially important if you are unable to feel  pain, heat, or cold. You have a greater risk of getting burned. Activity    Do not stay in bed. Staying in bed for more than 1-2 days can delay your recovery.  Sit up and stand up straight. Avoid leaning forward when you sit, or hunching over when you stand. ? If you work at a desk, sit close to it so you do not need to lean over. Keep your chin tucked in. Keep your neck drawn back, and keep your elbows bent at a right angle. Your arms should look like the letter "L." ? Sit high and close to the steering wheel when you drive. Add lower back (lumbar) support to your car seat, if needed.  Take short walks on even surfaces as soon as you are able. Try to increase the length of time you walk each day.  Do not sit, drive, or stand in one place for more than 30 minutes at a time. Sitting or standing for long periods of time can put stress on your back.  Do not drive or use heavy machinery while taking prescription pain medicine.  Use proper lifting techniques. When you bend and lift, use positions that put less stress on your back: ? [place] your knees. ? Keep the load close to your body. ? Avoid twisting.  Exercise regularly as told by your health care provider. Exercising helps your back heal faster and helps prevent back  injuries by keeping muscles strong and flexible.  Work with a physical therapist to make a safe exercise program, as recommended by your health care provider. Do any exercises as told by your physical therapist. Lifestyle  Maintain a healthy weight. Extra weight puts stress on your back and makes it difficult to have good posture.  Avoid activities or situations that make you feel anxious or stressed. Stress and anxiety increase muscle tension and can make back pain worse. Learn ways to manage anxiety and stress, such as through exercise. General instructions  Sleep on a firm mattress in a comfortable position. Try lying on your side with your knees slightly bent. If you  lie on your back, put a pillow under your knees.  Follow your treatment plan as told by your health care provider. This may include: ? Cognitive or behavioral therapy. ? Acupuncture or massage therapy. ? Meditation or yoga. Contact a health care provider if:  You have pain that is not relieved with rest or medicine.  You have increasing pain going down into your legs or buttocks.  Your pain does not improve after 2 weeks.  You have pain at night.  You lose weight without trying.  You have a fever or chills. Get help right away if:  You develop new bowel or bladder control problems.  You have unusual weakness or numbness in your arms or legs.  You develop nausea or vomiting.  You develop abdominal pain.  You feel faint. Summary  Acute back pain is sudden and usually short-lived.  Use proper lifting techniques. When you bend and lift, use positions that put less stress on your back.  Take over-the-counter and prescription medicines and apply heat or ice as directed by your health care provider. This information is not intended to replace advice given to you by your health care provider. Make sure you discuss any questions you have with your health care provider. Document Revised: 09/01/2018 Document Reviewed: 12/25/2016 Elsevier Patient Education  [place].

## 2019-09-09 NOTE — Progress Notes (Addendum)
   Subjective:    Patient ID: Cassandra Pham, female    DOB: 1975-09-11, 44 y.o.   MRN: [id number]  HPI [name] is here today to f/u for low back pain. She reports that her back pain had improved and as soon as she stopped the prednisone yesterday her back pain has returned. She does report she feels the prednisone helped and her pain is now a 6/10 and was 10/10. She reports she didn't feel the heat worked very well. She again denies any injury to her spine and reports the pain has been > than 1 month in which she took Ibuprofen peridically which helped lessen the pain but didn't resolve the pain. She denies numbness/tingling to her spine or legs. No weakness or loss of balance.   Review of Systems  Musculoskeletal: Positive for back pain.  Neurological: Negative for tremors, weakness and numbness.       Objective:   Physical Exam Constitutional:      General: She is not in acute distress.    Appearance: Normal appearance. She is not ill-appearing.  HENT:     Head: Normocephalic and atraumatic.  Abdominal:     General: Abdomen is flat.     Palpations: Abdomen is soft. There is no mass.     Tenderness: There is no abdominal tenderness. There is no right CVA tenderness, left CVA tenderness, guarding or rebound.     Comments: No tenderness to left/right kidney. No tenderness to abdomen.  Musculoskeletal:        General: Tenderness present. No swelling or signs of injury.     Cervical back: Neck supple.     Comments: Limited ROM with forward bend and lateral right bend. She grimaced with movements of spine. She has tenderness to right lower lateral l/s region that extended to the entire lateral side and over the iliac crest. No spine pain on palpation.  Skin:    General: Skin is warm and dry.  Neurological:     Mental Status: She is alert and oriented to person, place, and time.  Psychiatric:        Mood and Affect: Mood normal.           Assessment & Plan:

## 2019-09-16 ENCOUNTER — Other Ambulatory Visit: Payer: BC Managed Care – PPO

## 2019-09-20 ENCOUNTER — Ambulatory Visit: Payer: BC Managed Care – PPO | Attending: Internal Medicine

## 2019-09-20 DIAGNOSIS — Z20822 Contact with and (suspected) exposure to covid-19: Secondary | ICD-10-CM | POA: Diagnosis not present

## 2019-09-21 LAB — SARS-COV-2, NAA 2 DAY TAT

## 2019-09-21 LAB — NOVEL CORONAVIRUS, NAA: SARS-CoV-2, NAA: NOT DETECTED

## 2019-11-01 ENCOUNTER — Ambulatory Visit: Payer: BC Managed Care – PPO | Attending: Internal Medicine

## 2019-11-01 ENCOUNTER — Other Ambulatory Visit: Payer: BC Managed Care – PPO

## 2019-11-01 DIAGNOSIS — Z20822 Contact with and (suspected) exposure to covid-19: Secondary | ICD-10-CM | POA: Diagnosis not present

## 2019-11-02 ENCOUNTER — Ambulatory Visit: Payer: BC Managed Care – PPO | Admitting: Medical

## 2019-11-02 ENCOUNTER — Other Ambulatory Visit: Payer: Self-pay

## 2019-11-02 ENCOUNTER — Encounter: Payer: Self-pay | Admitting: Medical

## 2019-11-02 VITALS — BP 130/82 | HR 72 | Temp 97.3°F | Resp 16 | Wt 126.2 lb

## 2019-11-02 DIAGNOSIS — N3001 Acute cystitis with hematuria: Secondary | ICD-10-CM

## 2019-11-02 DIAGNOSIS — M545 Low back pain, unspecified: Secondary | ICD-10-CM

## 2019-11-02 DIAGNOSIS — R300 Dysuria: Secondary | ICD-10-CM

## 2019-11-02 LAB — POCT URINALYSIS DIPSTICK
Bilirubin, UA: NEGATIVE
Blood, UA: NEGATIVE
Glucose, UA: NEGATIVE
Ketones, UA: NEGATIVE
Nitrite, UA: NEGATIVE
Protein, UA: POSITIVE — AB
Spec Grav, UA: 1.025 (ref 1.010–1.025)
Urobilinogen, UA: 1 E.U./dL
pH, UA: 6.5 (ref 5.0–8.0)

## 2019-11-02 LAB — SARS-COV-2, NAA 2 DAY TAT

## 2019-11-02 LAB — NOVEL CORONAVIRUS, NAA: SARS-CoV-2, NAA: NOT DETECTED

## 2019-11-02 MED ORDER — IBUPROFEN 800 MG PO TABS: 800.0000 mg | ORAL_TABLET | Freq: Three times a day (TID) | ORAL | 0 refills | Status: DC | PRN

## 2019-11-02 MED ORDER — CIPROFLOXACIN HCL 500 MG PO TABS: 500.0000 mg | ORAL_TABLET | Freq: Two times a day (BID) | ORAL | 0 refills | Status: DC

## 2019-11-02 NOTE — Progress Notes (Signed)
° °  Subjective:    Patient ID: Cassandra Pham, female    DOB: 1975-06-02, 44 y.o.   MRN: [id number]  HPI 44 yo female in non acute distress. Presents today with urinary symptoms of  Odor and  Frequency, with  Urgency starting Thursday 10/28/2019.no pain at terminal urination. Does have some lower back pain , but also had that a month ago.   Blood pressure 130/82, pulse 72, temperature (!) 97.3 F (36.3 C), temperature source Temporal, resp. rate 16, weight 126 lb 3.2 oz (57.2 kg), SpO2 100 %.  Past Medical History:  Diagnosis Date   Anxiety and depression    Asthma    Bacterial vaginosis    Family history of ovarian cancer 02/2019   MyRisk testing declined   Family history of pancreatic cancer    Medical history non-contributory    Migraine    Allergies  Allergen Reactions   Ginger Hives    Current Outpatient Medications:    ibuprofen (ADVIL) 800 MG tablet, Take 1 tablet (800 mg total) by mouth every 8 (eight) hours as needed. Take with food, Disp: 30 tablet, Rfl: 0   Vitamin D, Ergocalciferol, (DRISDOL) 1.25 MG (50000 UNIT) CAPS capsule, Take 1 capsule (50,000 Units total) by mouth every 7 (seven) days. X 3 months., Disp: 12 capsule, Rfl: 0   ciprofloxacin (CIPRO) 500 MG tablet, Take 1 tablet (500 mg total) by mouth 2 (two) times daily., Disp: 14 tablet, Rfl: 0 Review of Systems  Genitourinary: Positive for difficulty urinating, dysuria and frequency. Negative for hematuria and urgency.  Musculoskeletal: Positive for back pain (both sides lower intermittent.).       Objective:   Physical Exam Vitals and nursing note reviewed.  Skin:    Capillary Refill: Capillary refill takes less than 2 seconds.  Neurological:     General: No focal deficit present.     Mental Status: She is oriented to person, place, and time.  Psychiatric:        Mood and Affect: Mood normal.        Behavior: Behavior normal.        Thought Content: Thought content normal.         Judgment: Judgment normal.           Assessment & Plan:  UTI,  increase water intake. Reviewed healthy education to prevent UTIs. Low back pain, refill of Ibuprofen. Warm packs to the area and exercises per last visit.  Follow up with your doctor if not improving in 3-5 days. I will only call patient if there is resistance to the current antibiotic prescribed.  Patient verbalizes understanding and has no questions at discharge.  Meds ordered this encounter  Medications   ciprofloxacin (CIPRO) 500 MG tablet    Sig: Take 1 tablet (500 mg total) by mouth 2 (two) times daily.    Dispense:  14 tablet    Refill:  0   ibuprofen (ADVIL) 800 MG tablet    Sig: Take 1 tablet (800 mg total) by mouth every 8 (eight) hours as needed. Take with food    Dispense:  30 tablet    Refill:  0

## 2019-11-05 LAB — URINE CULTURE

## 2019-11-08 ENCOUNTER — Encounter: Payer: Self-pay | Admitting: Medical

## 2019-11-08 IMAGING — MG MM DIGITAL SCREENING BILAT W/ TOMO W/ CAD
8 series · 9 of 24 positions shown · non-contrast
Comparison: Previous exam(s).

CLINICAL DATA: Screening.

EXAM:
DIGITAL SCREENING BILATERAL MAMMOGRAM WITH TOMO AND CAD

[L MLO synth-2D]
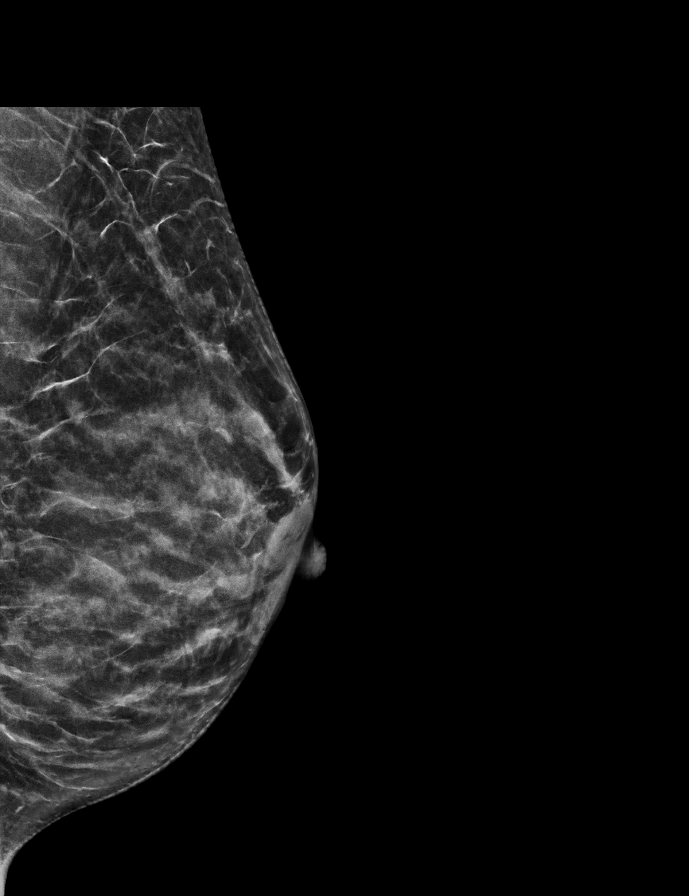

[R CC synth-2D]
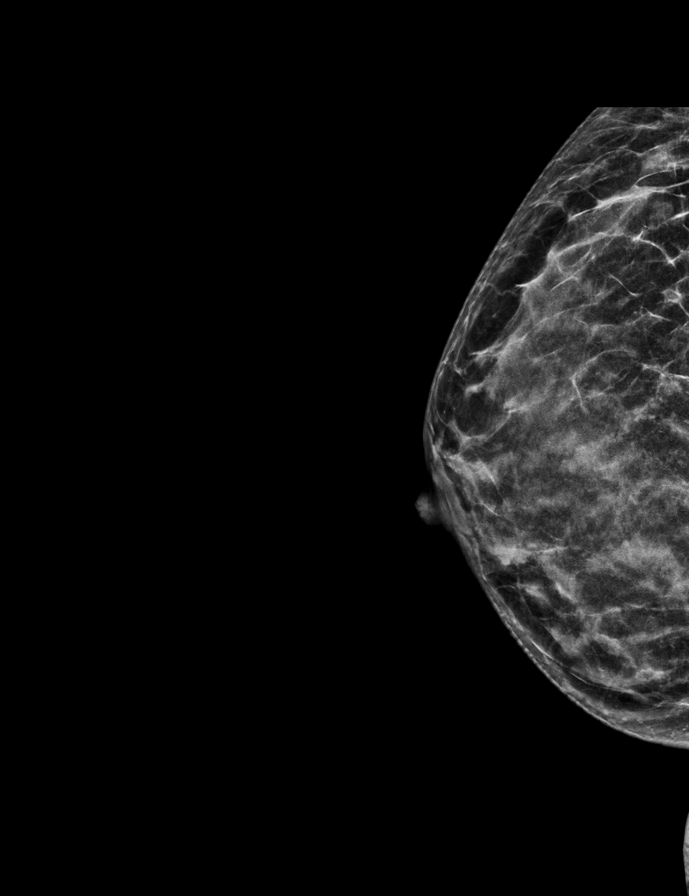

[R MLO synth-2D]
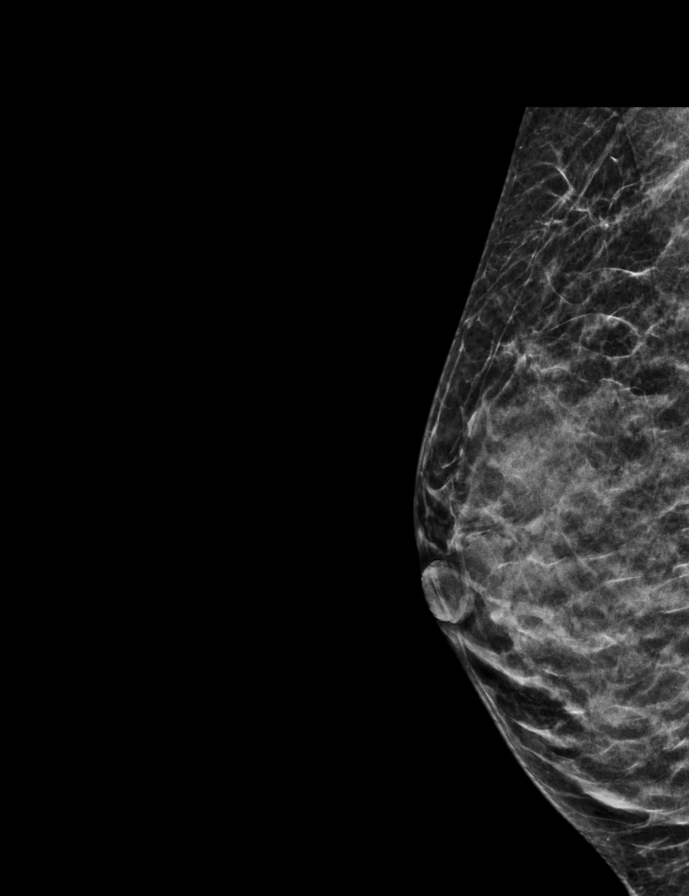

[L CC synth-2D]
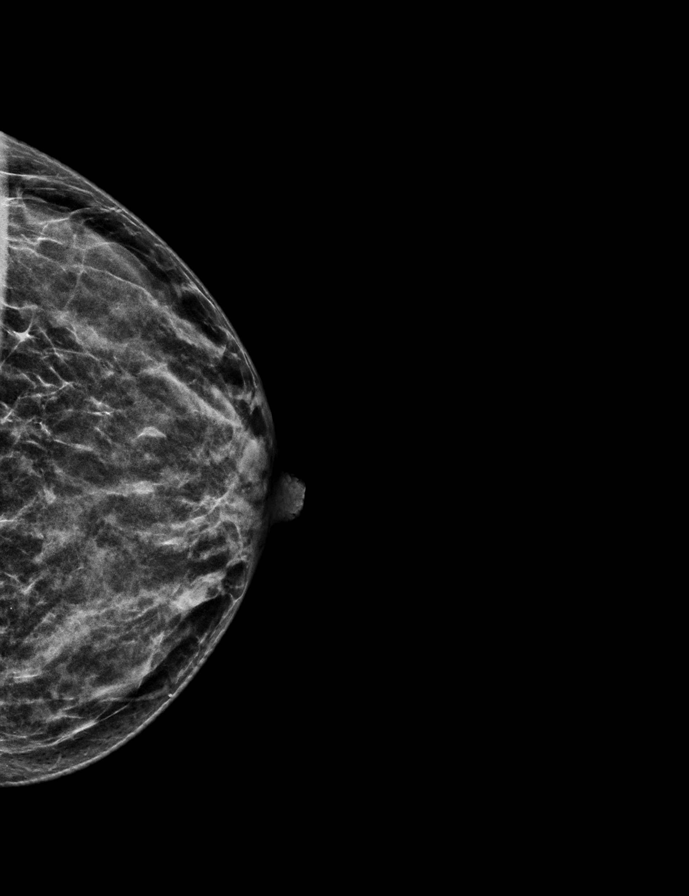

[L CC tomo · 2 of 35 frames shown]
[frame 12/35]
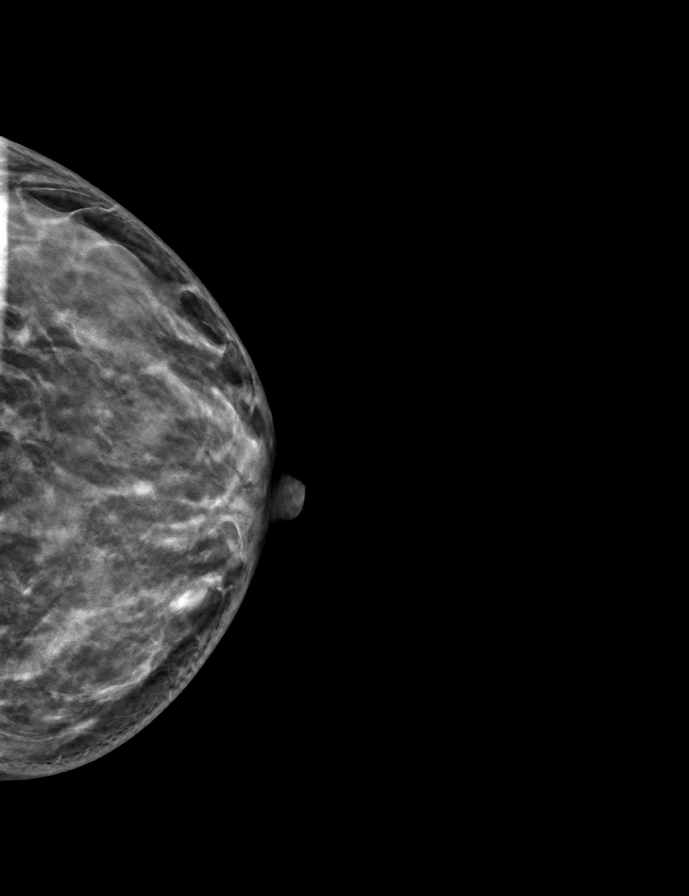
[frame 18/35]
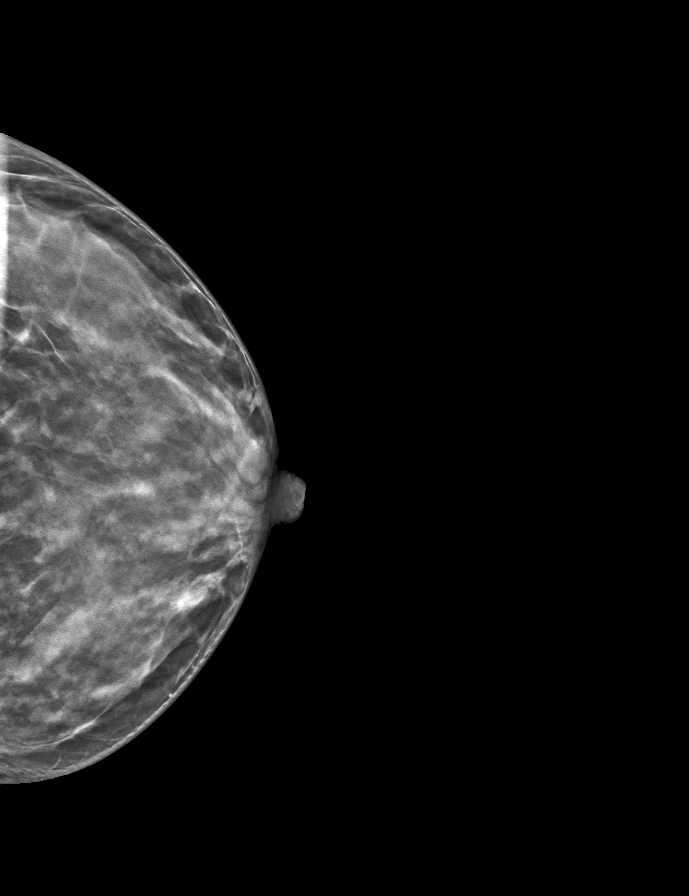

[L MLO tomo · tomo slice 27/53.0]
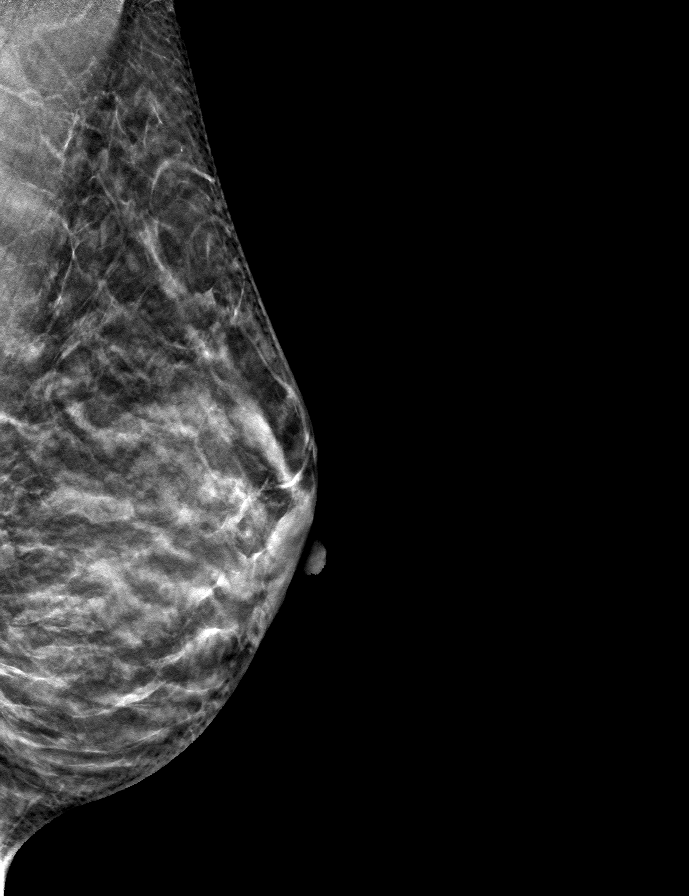

[R MLO tomo · tomo slice 21/40.0]
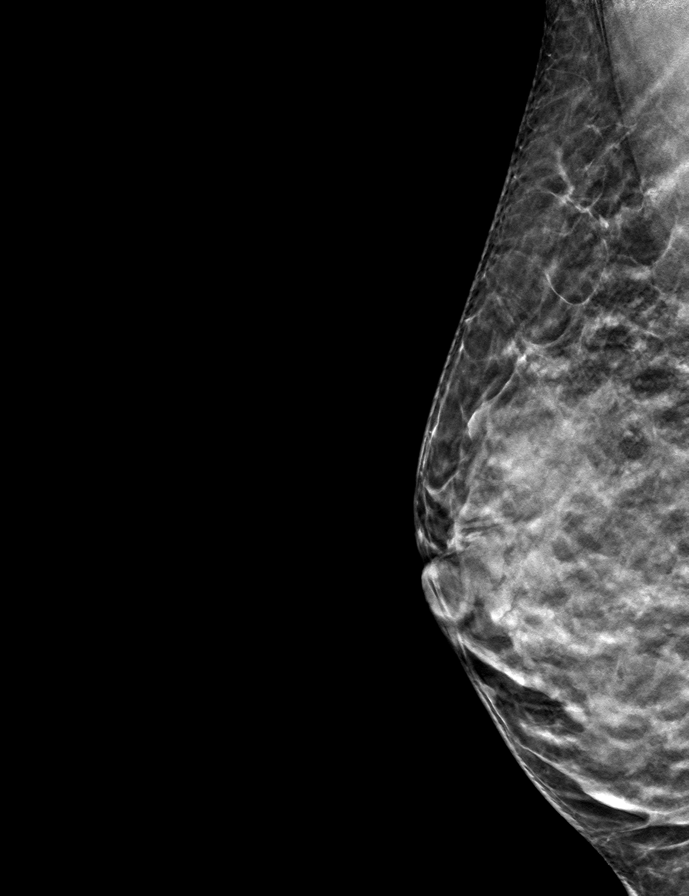

[R CC tomo · tomo slice 23/46.0]
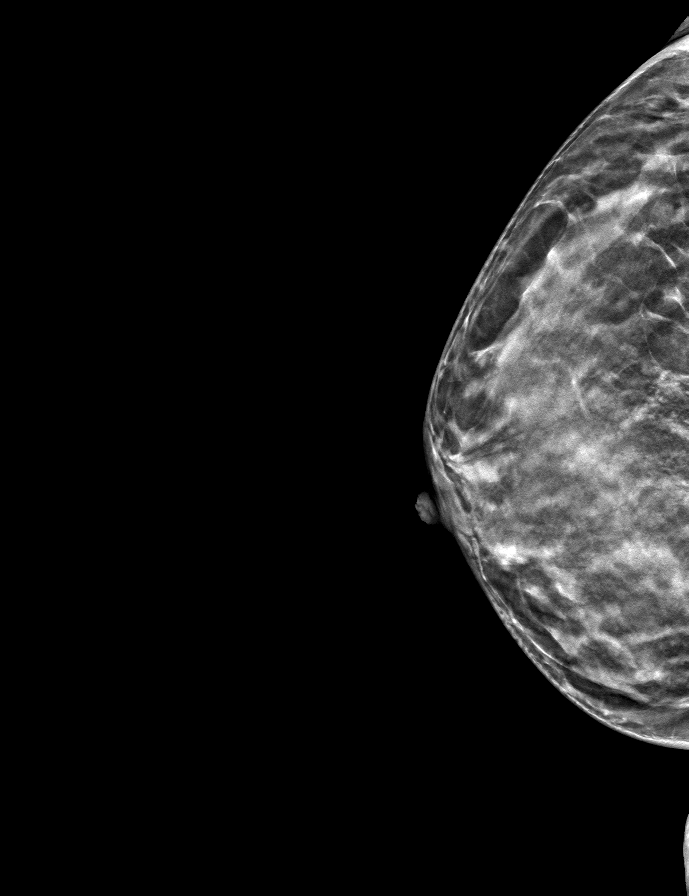

[9 of 24 positions shown; findings below may reference images not displayed]

ACR Breast Density Category c: The breast tissue is heterogeneously
dense, which may obscure small masses.
FINDINGS: There are no findings suspicious for malignancy. Images were
processed with CAD.
IMPRESSION: No mammographic evidence of malignancy. A result letter of this
screening mammogram will be mailed directly to the patient.

RECOMMENDATION:
Screening mammogram in one year. (Code:FT-U-LHB)

BI-RADS CATEGORY  1: Negative.

## 2019-11-17 ENCOUNTER — Encounter: Payer: Self-pay | Admitting: Medical

## 2019-11-17 ENCOUNTER — Other Ambulatory Visit: Payer: Self-pay

## 2019-11-17 ENCOUNTER — Ambulatory Visit: Payer: BC Managed Care – PPO | Admitting: Medical

## 2019-11-17 VITALS — BP 127/88 | HR 73 | Temp 97.1°F | Resp 16 | Wt 128.6 lb

## 2019-11-17 DIAGNOSIS — H1033 Unspecified acute conjunctivitis, bilateral: Secondary | ICD-10-CM

## 2019-11-17 MED ORDER — CIPROFLOXACIN HCL 0.3 % OP SOLN: 1.0000 [drp] | OPHTHALMIC | 0 refills | Status: DC

## 2019-11-17 NOTE — Progress Notes (Signed)
Patient ID: NAKYIAH KUCK, female   DOB: 1975/09/17, 44 y.o.   MRN: [id number]  Computer lost connection to server, that is why there are  Two notes. Had to redo note per computer request.    Subjective   Patient ID: MATISSE ROSKELLEY, female    DOB: [birth year], 44 y.o.   MRN: [id number]  HPI 43 yo female in non acute distress. Comes in today with complaints of "pink eye" on the left eye and today now starting in right eye.. Noticed it yesterday. With redness and discharge.Marrian Salvage but not painful on the left eye which is worse then right eye. Denies visual changes or photophobia. .  Put an eye drop but unsure of what the bottle said. No known exposure.  Did go to work today and was instructed to come see [place] in the clinic.  Review of Systems  Eyes: Positive for discharge, redness and itching. Negative for photophobia, pain and visual disturbance.  Had wisdom teeth out last Friday.     Objective:   Objective   Physical Exam Constitutional:      Appearance: Normal appearance. She is normal weight.  Eyes:     Extraocular Movements: Extraocular movements intact.     Pupils: Pupils are equal, round, and reactive to light.  Pulmonary:     Effort: Pulmonary effort is normal.  Neurological:     General: No focal deficit present.     Mental Status: She is alert and oriented to person, place, and time. Mental status is at baseline.  Psychiatric:        Mood and Affect: Mood normal.        Behavior: Behavior normal.        Thought Content: Thought content normal.        Judgment: Judgment normal.   wearing glasses.  Physical Exam Eyes:     General: Lids are normal. Vision grossly intact. Gaze aligned appropriately.        Left eye: Discharge present.    Conjunctiva/sclera:     Right eye: Right conjunctiva is injected.     Left eye: Left conjunctiva is injected (worse then right).     Pupils: Pupils are equal, round, and reactive to light.     Funduscopic  exam:    Right eye: Red reflex present.        Left eye: Red reflex present.       Assessment & Plan:  Conjunctivitis bilateral Meds ordered this encounter  Medications  . ciprofloxacin (CILOXAN) 0.3 % ophthalmic solution    Sig: Place 1 drop into both eyes every 2 (two) hours. Administer 1 drop, every 2 hours, while awake, for 2 days. Then 1 drop, every 4 hours, while awake, for the next 5 days.    Dispense:  5 mL    Refill:  0  reviewed hygiene with patient. Return in  3 - 5 days if not improving or if worsening. To stay out of work  X  24 hours. Patient verbalizes understanding and has no questions at discharge.

## 2019-11-17 NOTE — Patient Instructions (Signed)

## 2019-12-02 ENCOUNTER — Ambulatory Visit: Payer: BC Managed Care – PPO | Admitting: Nurse Practitioner

## 2019-12-03 ENCOUNTER — Ambulatory Visit: Payer: BC Managed Care – PPO | Admitting: Nurse Practitioner

## 2019-12-03 ENCOUNTER — Encounter: Payer: Self-pay | Admitting: Nurse Practitioner

## 2019-12-03 ENCOUNTER — Other Ambulatory Visit: Payer: Self-pay

## 2019-12-03 VITALS — BP 118/78 | HR 110 | Temp 98.2°F | Wt 123.6 lb

## 2019-12-03 DIAGNOSIS — M545 Low back pain, unspecified: Secondary | ICD-10-CM

## 2019-12-03 DIAGNOSIS — E559 Vitamin D deficiency, unspecified: Secondary | ICD-10-CM

## 2019-12-03 NOTE — Progress Notes (Signed)
   Subjective:    Patient ID: Cassandra Pham, female    DOB: [birth year], 44 y.o.   MRN: [id number]   HPI Patient is here for follow up regarding lower back pain that she started to experience in April 2021-without known injury. She took a round of prednisone at that time and was recommended for PT but never got a call so has been doing home exercises with improvement.She has also been using a back support for her desk, she sits most of the day. Denies any pain into her legs. Today she feels 50% improved from April  She has been taking Vitamin D for 12 weeks once weekly- last level check in April was @19 .  Since last visit Conjunctivitis resolved. Denies other complaints today.   Review of Systems  Constitutional: Negative.   HENT: Negative.   Eyes: Negative.   Musculoskeletal: Positive for back pain.  Neurological: Negative.        Objective:   Physical Exam Constitutional:      Appearance: Normal appearance.  Musculoskeletal:     Cervical back: Normal, normal range of motion and neck supple.     Lumbar back: Decreased range of motion.     Comments: Pain when bending forward unable to fully flex forward at waist - pain remains in bilateral SI regions without pain to spinal region. Can rotate laterally without limitation or pain   Skin:    General: Skin is warm and dry.  Neurological:     Mental Status: She is alert and oriented to person, place, and time.     Sensory: Sensation is intact.     Motor: Motor function is intact.     Gait: Gait is intact.           Assessment & Plan:  Will send referral back to PT at [facility], gave phone number to patient to follow up and schedule appointment . Discussed stretching and encouraged an evaluation at PT for home exercise and stretching routine with core strengthening.   RTC with any new or worsening pain, pain into legs/numbness tingling or weakness as discussed  Will f/u with Vitamin D levels and further instruction when lab  results are available.

## 2019-12-06 ENCOUNTER — Ambulatory Visit: Payer: BC Managed Care – PPO | Attending: Internal Medicine

## 2019-12-06 DIAGNOSIS — Z20822 Contact with and (suspected) exposure to covid-19: Secondary | ICD-10-CM

## 2019-12-07 LAB — NOVEL CORONAVIRUS, NAA: SARS-CoV-2, NAA: NOT DETECTED

## 2019-12-07 LAB — SARS-COV-2, NAA 2 DAY TAT

## 2019-12-14 LAB — VITAMIN D 1,25 DIHYDROXY
Vitamin D 1, 25 (OH)2 Total: 71 pg/mL — ABNORMAL HIGH
Vitamin D2 1, 25 (OH)2: 44 pg/mL
Vitamin D3 1, 25 (OH)2: 27 pg/mL

## 2019-12-15 ENCOUNTER — Other Ambulatory Visit: Payer: Self-pay | Admitting: Obstetrics and Gynecology

## 2019-12-15 DIAGNOSIS — Z1231 Encounter for screening mammogram for malignant neoplasm of breast: Secondary | ICD-10-CM

## 2019-12-23 ENCOUNTER — Encounter: Payer: Self-pay | Admitting: Nurse Practitioner

## 2019-12-31 ENCOUNTER — Encounter: Payer: Self-pay | Admitting: Obstetrics and Gynecology

## 2019-12-31 ENCOUNTER — Other Ambulatory Visit: Payer: Self-pay

## 2019-12-31 ENCOUNTER — Ambulatory Visit
Admission: RE | Admit: 2019-12-31 | Discharge: 2019-12-31 | Disposition: A | Discharge status: Home / Self Care | Payer: BC Managed Care – PPO | Source: Ambulatory Visit | Attending: Obstetrics and Gynecology | Admitting: Obstetrics and Gynecology

## 2019-12-31 DIAGNOSIS — Z1231 Encounter for screening mammogram for malignant neoplasm of breast: Secondary | ICD-10-CM | POA: Insufficient documentation

## 2020-01-04 ENCOUNTER — Encounter: Payer: Self-pay | Admitting: Physical Therapy

## 2020-01-04 ENCOUNTER — Other Ambulatory Visit: Payer: Self-pay

## 2020-01-04 ENCOUNTER — Ambulatory Visit: Payer: BC Managed Care – PPO | Attending: Nurse Practitioner | Admitting: Physical Therapy

## 2020-01-04 DIAGNOSIS — M545 Low back pain, unspecified: Secondary | ICD-10-CM

## 2020-01-04 NOTE — Therapy (Signed)
[facility] [address], [place], [address] Phone: [phone]   Fax:  [phone]  Physical Therapy Evaluation  Patient Details  Name: Cassandra Pham MRN: [id number] Date of Birth: Aug 08, 1975 Referring Provider (PT): Apolonio Schneiders, FNP    Encounter Date: 01/04/2020   PT End of Session - 01/04/20 1156    Visit Number 1    Number of Visits 24    Date for PT Re-Evaluation 03/28/20    Authorization Type [employer] reporting period from 01/04/2020    PT Start Time 0900    PT Stop Time 1000    PT Time Calculation (min) 60 min    Activity Tolerance Patient tolerated treatment well    Behavior During Therapy [facility] for tasks assessed/performed           Past Medical History:  Diagnosis Date   Anxiety and depression    Asthma    Bacterial vaginosis    Family history of ovarian cancer 02/2019   MyRisk testing declined   Family history of pancreatic cancer    Medical history non-contributory    Migraine     Past Surgical History:  Procedure Laterality Date   ABLATION     APPENDECTOMY     ESOPHAGOGASTRODUODENOSCOPY (EGD) WITH PROPOFOL N/A 10/05/2015   Procedure: ESOPHAGOGASTRODUODENOSCOPY (EGD) WITH PROPOFOL;  Surgeon: Hulen Luster, MD;  Location: [facility] ENDOSCOPY;  Service: Gastroenterology;  Laterality: N/A;   TUBAL LIGATION      There were no vitals filed for this visit.    Subjective Assessment - 01/04/20 0914    Subjective Patient states she is unsure how her pain started but it started about 2 months ago. She just started having pains in her lower back. She could not shake it so she made an appt with the doctor who gave her a couple of exercises and sent for an xray and everything was fine except for one side is stronger than the other. The pain was so bad she put her on a steroid and ibuprofen. Ibuprofen 800 mg really helps. She also started exercising as well with the  exercises she was given which helped a little bit. She feels like the steroid helped and it seems to be flairing up again. She started getting worse pain over the weekend. Denies prior history of back problems. Denies spinal surgeries. Disrupts sleep. Naturally sleeps on stomach, now sleeps on left side because that is better. Pain is bilateral low back, R > L. Denies any referral down legs at any time.  Started in the same spot. She sits at a desk all day for work.    Pertinent History Patient is a 44 y.o. female who presents to outpatient physical therapy with a referral for medical diagnosis acute bilateral low back pain without sciatica. This patient's chief complaints consist of intermittent low back pain R > L with insidious onset about 2 months ago, leading to the following functional deficits: difficulty with sleeping, working, prolonged sitting, prolonged walking, bending, lifting, playing with her 56 year old granddaughter, cleaning, etc. Relevant past medical history and comorbidities include hx anxiety/depression, left UE problems (mild hindrance now), migraine.  Patient denies hx of cancer, stroke, seizures, lung problem, major cardiac events, diabetes, unexplained weight loss, changes in bowel or bladder problems, new onset stumbling or dropping things.    Limitations Sitting;Lifting;Standing;House hold activities;Other (comment)   difficulty with sleeping, working, prolonged sitting, prolonged walking, bending, lifting, playing with her  88 year old granddaughter, cleaning, etc.   How long can you sit comfortably? 2 hours    How long can you stand comfortably? 30 min    Diagnostic tests pt reports recent x-ray was normal. Lumbar MRI report 08/22/2015: "IMPRESSION:Normal MRI of the lumbar spine. No findings to explain left lowerextremity symptoms identified."    Patient Stated Goals want my back to get better    Currently in Pain? Yes    Pain Score 7    Best: 0/10; Worst: 10/10   Pain Location  Back    Pain Orientation Right;Left    Pain Descriptors / Indicators Aching;Pressure    Pain Type Chronic pain    Pain Radiating Towards denies radiation    Pain Onset More than a month ago    Pain Frequency Intermittent    Aggravating Factors  prolonged sitting, rising, walking, prolonged standing, bending forwards    Pain Relieving Factors ibuprofen 800, laying down    Effect of Pain on Daily Activities when her back hurts it makes her lays down, has had to leave work, bothers her with tasks that require bending down including cleaning, disrupts sleep.              [facility] PT Assessment - 01/04/20 0001      Assessment   Medical Diagnosis acute bilateral low back pain without sciatica    Referring Provider (PT) Apolonio Schneiders, FNP     Onset Date/Surgical Date 11/04/19    Hand Dominance Right    Next MD Visit after PT     Prior Therapy none for this problem prior to current episode of care      Precautions   Precautions None      Restrictions   Weight Bearing Restrictions No      Balance Screen   Has the patient fallen in the past 6 months No    Has the patient had a decrease in activity level because of a fear of falling?  No    Is the patient reluctant to leave their home because of a fear of falling?  No      Home Environment   Living Environment --   no concerns about getting around home     Prior Function   Level of Independence Independent    Vocation Full time employment    Vocation Requirements computer work, Administrator for student conduct    Leisure shop and spend time with granddaughter (6 years old).       Cognition   Overall Cognitive Status Within Functional Limits for tasks assessed      Observation/Other Assessments   Focus on Therapeutic Outcomes (FOTO)  57           OBJECTIVE  OBSERVATION/INSPECTION Posture: increased lumber lordosis with excessive anterior pelvic tilt in sitting and standing. Possible mild left shift.    Tremor:  none  Muscle bulk: generally WFL  Bed mobility: supine to <> sit and rolling independent but painful and guarded.   Transfers: sit <> stand independent but painful and guarded.  Gait: grossly WFL for household and short community ambulation. More detailed gait analysis deferred to later date as needed.   SPINE MOTION Lumbar AROM *Indicates pain - Flexion: = fingers to ankles, painful stiff. Slightly poor curve reversal.  - Extension: = painful, limited 25%.  - Rotation: R = 75% painful, L = 100% very painful at R. - Side Flexion: R = proximal to patella, pain, L = distal to patella,  felt okay. . (pt reports increased irritation and demonstrates anxious body language after AROM testing).   NEUROLOGICAL Upper Motor Neuron Screen  Clonus (ankle) negative bilaterally Dermatomes  L2-S2 appears equal and intact to light touch. Myotomes  L2-S2 appears intact  PERIPHERAL JOINT MOTION (in degrees) Active Range of Motion (AROM) Comments: B LE appear WFL for basic activities except some restriction in B hip extension.  Passive Range of Motion (PROM) Comments: B LE grossly WFL for basic mobility except hip extension restricted bilaterally. Further assessment deferred to future session as needed.   MUSCLE PERFORMANCE (MMT):  Comments: LE MMT generally WFL except possibly slightly weaker to R PF and hip flexion with pain during R hip flexion. Hip extension and abduction performed in standing due to wearing a dress. Pt appeared to have difficulty giving full effort on testing.   SUSTAINED POSITIONS TESTING:  Position Time During  After  Comments  Prone lying x3 min Increasing pain in R low back worse Intermixed with attempt at repeated lumbar extension in prone  Comments:   REPEATED MOTIONS TESTING: Motion/Technique sets x reps During After Comments  Lumbar extension in prone 1 rep Increased R back pain unclear Pt extremely uncomfortable with exercise and stated she could not continue.  Appeared upset.   R side glide at wall (L side to wall) 1x10 Increased R back pain No worse Patient appeared upset by the discomfort of the exercise.  Comments: patient appears to be tolerating all end range motions poorly, appeared upset, stated that her pain was going up overall due to all the motions but somewhat unclear when reporting the effect during and after each exercise. Discontinued testing due to poor tolerance and apparently all tests making pain worse.   SPECIAL TESTS: Sacral thrust: negative  ACCESSORY MOTION:  - Painful on R low back to CPA worst at L5, and decreasing as ascending through lumbar spine. No reproduction of symptoms with CPA to sacrum.   PALPATION: - TTP at right lumbar paraspinals. Most tender over R SIJ, sciatic notch and R L5/S1 region.    EDUCATION/COGNITION: Patient is alert and oriented X 4.   Objective measurements completed on examination: See above findings.     TREATMENT:   Therapeutic exercise: to centralize symptoms and improve ROM, strength, muscular endurance, and activity tolerance required for successful completion of functional activities.  - hooklying lower trunk rotation x 20 - hooklying pelvic tilt tolerable full AROM, x20, required additional cuing to stay in pain free range - hooklying abdominal brace with palpation at TrA, 5 second holds, x 10 - Education on diagnosis, prognosis, POC, anatomy and physiology of current condition.  - Education on HEP including handout    HOME EXERCISE PROGRAM Access Code: [email or web address] URL: [email or web address] Date: 01/04/2020 Prepared by: Rosita Kea  Exercises Supine Lower Trunk Rotation - 2 x daily - 20 reps Supine Pelvic Tilt - 2 x daily - 20 reps Supine Transversus Abdominis Bracing - Hands on Stomach - 2 x daily - 2 sets - 10 reps - 5 seconds hold   Patient response to treatment:  Pt tolerated treatment well. Pt was able to complete all exercises with minimal to no lasting  increase in pain or discomfort. Pt required multimodal cuing for proper technique and to facilitate improved neuromuscular control, strength, range of motion, and functional ability resulting in improved performance and form.  She did find the repeated motions testing and AROM testing very uncomfortable. Appears to be further aggravated by end range movement at  this point but tolerated mid-range much better.      PT Education - 01/04/20 1149    Education Details Exercise purpose/form. Self management techniques. Education on diagnosis, prognosis, POC, anatomy and physiology of current condition Education on HEP including handout    Person(s) Educated Patient    Methods Explanation;Demonstration;Tactile cues;Verbal cues;Handout    Comprehension Verbalized understanding;Returned demonstration;Verbal cues required;Tactile cues required;Need further instruction            PT Short Term Goals - 01/04/20 1148      PT SHORT TERM GOAL #1   Title Be independent with initial home exercise program for self-management of symptoms.    Baseline Initial HEP provided at IE (01/04/2020);    Time 2    Period Weeks    Status New             PT Long Term Goals - 01/04/20 1017      PT LONG TERM GOAL #1   Title Be independent with a long-term home exercise program for self-management of symptoms.    Baseline Initial HEP provided at IE (01/04/2020);    Time 12    Period Weeks    Status New   TARGET DATE FOR ALL LONG TERM GOALS: 03/28/2020     PT LONG TERM GOAL #2   Title Demonstrate improved FOTO score to equal or greater than 73 by visit #10 to demonstrate improvement in overall condition and self-reported functional ability.    Baseline 57 (01/04/2020);    Time 12    Period Weeks    Status New      PT LONG TERM GOAL #3   Title Have full lumbar AROM with no compensations or increase in pain in all planes except intermittent end range discomfort to allow patient to complete valued activities with  less difficulty.    Baseline limited and painful - see objective exam (01/04/2020);    Time 12    Period Weeks    Status New      PT LONG TERM GOAL #4   Title Reduce pain with functional activities to equal or less than 1/10 to allow patient to complete usual activities including ADLs, IADLs, and social engagement with less difficulty.    Baseline up to 10/10 (01/04/2020);    Time 12    Period Weeks    Status New      PT LONG TERM GOAL #5   Title Complete community, work and/or recreational activities without limitation due to current condition.    Baseline Functional Limitations: when her back hurts it makes her lays down, has had to leave work, bothers her with tasks that require bending down including cleaning, disrupts sleep, makes it hard to pick up granddaughter (3y/o) (01/04/2020);    Time 12    Period Weeks    Status New                  Plan - 01/04/20 1153    Clinical Impression Statement Patient is a 44 y.o. female referred to outpatient physical therapy with a medical diagnosis of acute bilateral low back pain without sciatica who presents with signs and symptoms consistent with sub-acute low back pain R > L that is currently intolerant to end range motions. Patient presents with significant pain, stiffness, activity tolerance, motor control, muscle performance (strength/power/endurance) impairments that are limiting ability to complete her usual activities including sleeping, working, prolonged sitting, prolonged walking, bending, lifting, playing with her 75 year old granddaughter, cleaning, etc, without  difficulty and decrease her quality of life. MDT provisional classification of Other: MI at this point but will continue to be assessed in the future for reclassification.  Patient will benefit from skilled physical therapy intervention to address current body structure impairments and activity limitations to improve function and work towards goals set in current POC in order  to return to prior level of function or maximal functional improvement.    Personal Factors and Comorbidities Age;Behavior Pattern;Comorbidity 3+;Profession;Time since onset of injury/illness/exacerbation    Comorbidities Relevant past medical history and comorbidities include hx anxiety/depression, left UE problems (mild hindrance now), migraine.    Examination-Activity Limitations Lift;Bend;Caring for Others;Squat;Stand;Transfers;Sit    Examination-Participation Restrictions Occupation;Driving;Community Activity;Cleaning;Interpersonal Relationship    Stability/Clinical Decision Making Evolving/Moderate complexity    Clinical Decision Making Moderate    Rehab Potential Good    PT Frequency 2x / week    PT Duration 12 weeks    PT Treatment/Interventions ADLs/Self Care Home Management;Cryotherapy;Moist Heat;Therapeutic activities;Therapeutic exercise;Neuromuscular re-education;Patient/family education;Manual techniques;Dry needling;Passive range of motion;Spinal Manipulations;Joint Manipulations    PT Next Visit Plan progressive ROM, motor control, and strengthening as tolerated; consider manual therapy    PT Home Exercise Plan Medbridge Access Code: [email or web address]    Consulted and Agree with Plan of Care Patient           Patient will benefit from skilled therapeutic intervention in order to improve the following deficits and impairments:  Pain, Decreased mobility, Increased muscle spasms, Decreased activity tolerance, Decreased endurance, Decreased range of motion, Decreased strength, Impaired perceived functional ability, Impaired flexibility, Difficulty walking  Visit Diagnosis: Bilateral low back pain without sciatica, unspecified chronicity     Problem List Patient Active Problem List   Diagnosis Date Noted   Numbness and tingling in left arm 04/08/2018   Acute pain of left shoulder 01/30/2018   Chronic migraine without aura without status migrainosus, not intractable 01/30/2018    Asthma 01/30/2018   Anxiety and depression 01/30/2018   Anxiety and depression 01/30/2018   Asthma 01/30/2018   Chronic migraine without aura without status migrainosus, not intractable 01/30/2018   Family history of ovarian cancer 02/25/2017   Gout 10/02/2016    Everlean Alstrom. Graylon Good, PT, DPT 01/04/20, 12:07 PM  [facility] [address] [place], [address] Phone: [phone]   Fax:  [phone]  Name: Cassandra Pham MRN: [id number] Date of Birth: Oct 02, 1975

## 2020-01-06 ENCOUNTER — Ambulatory Visit: Payer: BC Managed Care – PPO | Admitting: Physical Therapy

## 2020-01-11 ENCOUNTER — Ambulatory Visit: Payer: BC Managed Care – PPO | Admitting: Physical Therapy

## 2020-01-13 ENCOUNTER — Ambulatory Visit: Payer: BC Managed Care – PPO | Admitting: Physical Therapy

## 2020-01-18 ENCOUNTER — Telehealth: Payer: Self-pay | Admitting: Physical Therapy

## 2020-01-18 ENCOUNTER — Ambulatory Visit: Payer: BC Managed Care – PPO | Admitting: Physical Therapy

## 2020-01-18 NOTE — Telephone Encounter (Signed)
Called patient after she did not show up to her 4:30 appointment today. Patient answered and apologized profusely. States that she has been dealing with an important family issue that involves her son and has spent most of today at [facility]. She states it will likely take weeks to resolve and she would like to cancel all PT appointments at this time and will call back when she is able to attend in the future.   Updated front office staff.   Everlean Alstrom. Graylon Good, PT, DPT 01/18/20, 5:22 PM

## 2020-01-19 DIAGNOSIS — G8929 Other chronic pain: Secondary | ICD-10-CM | POA: Diagnosis not present

## 2020-01-19 DIAGNOSIS — M25561 Pain in right knee: Secondary | ICD-10-CM | POA: Diagnosis not present

## 2020-01-20 ENCOUNTER — Ambulatory Visit: Payer: BC Managed Care – PPO | Admitting: Physical Therapy

## 2020-01-25 ENCOUNTER — Other Ambulatory Visit: Payer: Self-pay

## 2020-01-25 ENCOUNTER — Encounter: Payer: Self-pay | Admitting: Medical

## 2020-01-25 ENCOUNTER — Encounter: Payer: BC Managed Care – PPO | Admitting: Physical Therapy

## 2020-01-25 ENCOUNTER — Other Ambulatory Visit: Payer: Self-pay | Admitting: *Deleted

## 2020-01-25 ENCOUNTER — Ambulatory Visit: Payer: BC Managed Care – PPO | Admitting: Medical

## 2020-01-25 VITALS — BP 117/76 | HR 91 | Temp 97.8°F | Resp 16 | Wt 130.6 lb

## 2020-01-25 DIAGNOSIS — M62830 Muscle spasm of back: Secondary | ICD-10-CM

## 2020-01-25 DIAGNOSIS — Z20822 Contact with and (suspected) exposure to covid-19: Secondary | ICD-10-CM

## 2020-01-25 MED ORDER — CYCLOBENZAPRINE HCL 5 MG PO TABS: 5.0000 mg | ORAL_TABLET | Freq: Three times a day (TID) | ORAL | 1 refills | Status: DC | PRN

## 2020-01-25 MED ORDER — IBUPROFEN 800 MG PO TABS: 800.0000 mg | ORAL_TABLET | Freq: Three times a day (TID) | ORAL | 0 refills | Status: DC | PRN

## 2020-01-25 NOTE — Progress Notes (Signed)
Subjective:    Patient ID: Cassandra Pham, female    DOB: September 21, 1975, 44 y.o.   MRN: [id number]  HPI 44 yo female in non acute distress. Presents today with lower back pain. Starting yesterday, came on while she was sitting down.She sits most of the day. And gets up very little from her chair. History of back pain on/off for over  1 year. Went to PT but then she got the bill $250/week.She states she cannot afford $1000/month on physical therapy.  So PT gave her exercises to do at home. Though patient is unsure of how often to do the exercises.  Trying to do at least  3 times a week.  8.5/10 pain level today  no loss of bowel or bladder No buttock pain , no numbness or tingling. R>L side of lower back more painful.     Vaccinated Pfizer 08/20/19 last injection.  Sleeps on left side.She use to sleep on her back but felt it was not helping her back so she switched to her side.    Blood pressure 117/76, pulse 91, temperature 97.8 F (36.6 C), temperature source Temporal, resp. rate 16, weight 130 lb 9.6 oz (59.2 kg), SpO2 100 %.  Allergies  Allergen Reactions  . Ginger Hives  . Amoxil [Amoxicillin] Hives     Review of Systems  Constitutional: Negative for chills, fatigue and fever.  HENT: Negative for congestion, ear pain, postnasal drip, rhinorrhea, sinus pressure, sinus pain and sore throat.   Respiratory: Negative for choking, chest tightness, shortness of breath and wheezing.   Cardiovascular: Negative for chest pain.  Gastrointestinal: Negative for abdominal pain, diarrhea, nausea and vomiting.  Genitourinary: Negative for difficulty urinating.  Musculoskeletal: Positive for back pain.  Skin: Negative for rash.  Neurological: Negative for dizziness, syncope, light-headedness, numbness and headaches.       Objective:   Physical Exam Vitals and nursing note reviewed.  Constitutional:      Appearance: Normal appearance.  HENT:     Head: Normocephalic and  atraumatic.  Eyes:     Extraocular Movements: Extraocular movements intact.     Conjunctiva/sclera: Conjunctivae normal.     Pupils: Pupils are equal, round, and reactive to light.  Pulmonary:     Effort: Pulmonary effort is normal.  Musculoskeletal:        General: No tenderness. Normal range of motion.     Cervical back: Normal range of motion.     Comments: gait wnl  Skin:    General: Skin is warm.  Neurological:     General: No focal deficit present.     Mental Status: She is alert and oriented to person, place, and time. Mental status is at baseline.  Psychiatric:        Mood and Affect: Mood normal.        Behavior: Behavior normal.        Thought Content: Thought content normal.        Judgment: Judgment normal.     NAD SLR neg bilaterally Muscle spasm noted on the right lower paravertebral area Able to twist trunk and flex ( can only go down to her shins) she states her norm is much closer to the floor.     Assessment & Plan:  Lower back pain Stand up from sitting position  40min of every hour. Apply heat and ice to the lower back as discussed. Call HR and try to coordinate an ergonomic review of your work area. I also would consider  [employer] for a standing desk to help strengthen her core and get her moving throughout the day.I also gave Cassandra Pham some core strengthen exercises to perform and to work up to  10 a day then the following week  2 sets , 3rd week 3 sets.  Follow up with your doctor as needed. Meds ordered this encounter  Medications  . cyclobenzaprine (FLEXERIL) 5 MG tablet    Sig: Take 1 tablet (5 mg total) by mouth 3 (three) times daily as needed for muscle spasms.    Dispense:  30 tablet    Refill:  1  . ibuprofen (ADVIL) 800 MG tablet    Sig: Take 1 tablet (800 mg total) by mouth every 8 (eight) hours as needed.    Dispense:  30 tablet    Refill:  0  I also recommend a pillow between legs when she is on her side. She states she will give it a  try. Patient to RTC as needed. Patient verbalizes understanding and has no questions at discharge.

## 2020-01-25 NOTE — Patient Instructions (Signed)
Work on Counsellor These exercises help to make your trunk and back strong. They also help to keep the lower back flexible. Doing these exercises can help to prevent back pain or lessen existing pain.  If you have back pain, try to do these exercises 2-3 times each day or as told by your doctor.  As you get better, do the exercises once each day. Repeat the exercises more often as told by your doctor.  To stop back pain from coming back, do the exercises once each day, or as told by your doctor. Exercises Single knee to chest Do these steps 3-5 times in a row for each leg: 1. Lie on your back on a firm bed or the floor with your legs stretched out. 2. Bring one knee to your chest. 3. Grab your knee or thigh with both hands and hold them it in place. 4. Pull on your knee until you feel a gentle stretch in your lower back or buttocks. 5. Keep doing the stretch for 10-30 seconds. 6. Slowly let go of your leg and straighten it. Pelvic tilt Do these steps 5-10 times in a row: 1. Lie on your back on a firm bed or the floor with your legs stretched out. 2. Bend your knees so they point up to the ceiling. Your feet should be flat on the floor. 3. Tighten your lower belly (abdomen) muscles to press your lower back against the floor. This will make your tailbone point up to the ceiling instead of pointing down to your feet or the floor. 4. Stay in this position for 5-10 seconds while you gently tighten your muscles and breathe evenly. Cat-cow Do these steps until your lower back bends more easily: 1. Get on your hands and knees on a firm surface. Keep your hands under your shoulders, and keep your knees under your hips. You may put padding under your knees. 2. Let your head hang down toward your chest. Tighten (contract) the muscles in your belly. Point your tailbone toward the floor so your lower back becomes rounded like the back of a cat. 3. Stay in this position for 5  seconds. 4. Slowly lift your head. Let the muscles of your belly relax. Point your tailbone up toward the ceiling so your back forms a sagging arch like the back of a cow. 5. Stay in this position for 5 seconds.  Press-ups Do these steps 5-10 times in a row: 1. Lie on your belly (face-down) on the floor. 2. Place your hands near your head, about shoulder-width apart. 3. While you keep your back relaxed and keep your hips on the floor, slowly straighten your arms to raise the top half of your body and lift your shoulders. Do not use your back muscles. You may change where you place your hands in order to make yourself more comfortable. 4. Stay in this position for 5 seconds. 5. Slowly return to lying flat on the floor.  Bridges Do these steps 10 times in a row: 1. Lie on your back on a firm surface. 2. Bend your knees so they point up to the ceiling. Your feet should be flat on the floor. Your arms should be flat at your sides, next to your body. 3. Tighten your butt muscles and lift your butt off the floor until your waist is almost as high as your knees. If you do not feel the muscles working in your butt and the back of your  thighs, slide your feet 1-2 inches farther away from your butt. 4. Stay in this position for 3-5 seconds. 5. Slowly lower your butt to the floor, and let your butt muscles relax. If this exercise is too easy, try doing it with your arms crossed over your chest. Belly crunches Do these steps 5-10 times in a row: 1. Lie on your back on a firm bed or the floor with your legs stretched out. 2. Bend your knees so they point up to the ceiling. Your feet should be flat on the floor. 3. Cross your arms over your chest. 4. Tip your chin a little bit toward your chest but do not bend your neck. 5. Tighten your belly muscles and slowly raise your chest just enough to lift your shoulder blades a tiny bit off of the floor. Avoid raising your body higher than that, because it can  put too much stress on your low back. 6. Slowly lower your chest and your head to the floor. Back lifts Do these steps 5-10 times in a row: 1. Lie on your belly (face-down) with your arms at your sides, and rest your forehead on the floor. 2. Tighten the muscles in your legs and your butt. 3. Slowly lift your chest off of the floor while you keep your hips on the floor. Keep the back of your head in line with the curve in your back. Look at the floor while you do this. 4. Stay in this position for 3-5 seconds. 5. Slowly lower your chest and your face to the floor. Contact a doctor if:  Your back pain gets a lot worse when you do an exercise.  Your back pain does not get better 2 hours after you exercise. If you have any of these problems, stop doing the exercises. Do not do them again unless your doctor says it is okay. Get help right away if:  You have sudden, very bad back pain. If this happens, stop doing the exercises. Do not do them again unless your doctor says it is okay. This information is not intended to replace advice given to you by your health care provider. Make sure you discuss any questions you have with your health care provider. Document Revised: 02/05/2018 Document Reviewed: 02/05/2018 Elsevier Patient Education  2020 [employer].

## 2020-01-26 LAB — NOVEL CORONAVIRUS, NAA

## 2020-01-27 ENCOUNTER — Encounter: Payer: BC Managed Care – PPO | Admitting: Physical Therapy

## 2020-02-01 ENCOUNTER — Encounter: Payer: BC Managed Care – PPO | Admitting: Physical Therapy

## 2020-02-03 ENCOUNTER — Encounter: Payer: BC Managed Care – PPO | Admitting: Physical Therapy

## 2020-02-03 ENCOUNTER — Encounter: Payer: Self-pay | Admitting: Medical

## 2020-02-04 ENCOUNTER — Encounter: Payer: Self-pay | Admitting: Medical

## 2020-02-21 ENCOUNTER — Other Ambulatory Visit: Payer: Self-pay

## 2020-02-21 ENCOUNTER — Encounter: Payer: Self-pay | Admitting: Medical

## 2020-02-21 ENCOUNTER — Ambulatory Visit: Payer: BC Managed Care – PPO | Admitting: Medical

## 2020-02-21 VITALS — BP 125/78 | HR 89 | Temp 97.7°F | Resp 16 | Wt 127.4 lb

## 2020-02-21 DIAGNOSIS — N3001 Acute cystitis with hematuria: Secondary | ICD-10-CM

## 2020-02-21 LAB — POCT URINALYSIS DIPSTICK
Bilirubin, UA: NEGATIVE
Glucose, UA: NEGATIVE
Ketones, UA: NEGATIVE
Leukocytes, UA: NEGATIVE
Nitrite, UA: NEGATIVE
Protein, UA: POSITIVE — AB
Spec Grav, UA: >=1.03 — AB (ref 1.010–1.025)
Urobilinogen, UA: 0.2 E.U./dL
pH, UA: 6 (ref 5.0–8.0)

## 2020-02-21 LAB — POCT URINE PREGNANCY: Preg Test, Ur: NEGATIVE

## 2020-02-21 MED ORDER — NITROFURANTOIN MONOHYD MACRO 100 MG PO CAPS: 100.0000 mg | ORAL_CAPSULE | Freq: Two times a day (BID) | ORAL | 0 refills | Status: DC

## 2020-02-21 NOTE — Progress Notes (Signed)
Subjective:    Patient ID: Cassandra Pham, female    DOB: Sep 11, 1975, 45 y.o.   MRN: [id number]  HPI  44 yo female in non acute distress.  Started on Saturday with frequency, urgency, worsening on Sunday and blood present on wiping.  Started on Azo today.  Back pain on the right side.  No abdominal pain, no fever /chills, nausea or vomiting.   Review of Systems  Constitutional: Negative for chills and fever.  HENT: Negative for ear pain and sore throat.   Respiratory: Negative for shortness of breath.   Cardiovascular: Negative for chest pain.  Gastrointestinal: Negative for nausea and vomiting.  Genitourinary: Positive for dysuria, frequency, hematuria and urgency.  Musculoskeletal: Positive for back pain (right sided back pain).  Neurological: Negative for dizziness, syncope and light-headedness.   Has had a history of renal lithiasis in the past..    Objective:   Physical Exam Vitals and nursing note reviewed.  Constitutional:      Appearance: Normal appearance. She is normal weight.  HENT:     Head: Normocephalic and atraumatic.  Eyes:     Extraocular Movements: Extraocular movements intact.     Conjunctiva/sclera: Conjunctivae normal.     Pupils: Pupils are equal, round, and reactive to light.  Pulmonary:     Effort: Pulmonary effort is normal.  Abdominal:     General: Abdomen is flat. There is no distension.     Palpations: Abdomen is soft.     Tenderness: There is no abdominal tenderness. There is no right CVA tenderness, left CVA tenderness or guarding.  Skin:    General: Skin is warm and dry.  Neurological:     General: No focal deficit present.     Mental Status: She is alert and oriented to person, place, and time.  Psychiatric:        Mood and Affect: Mood normal.        Behavior: Behavior normal.        Thought Content: Thought content normal.        Judgment: Judgment normal.    Recent Results (from the past 2160 hour(s))  Vitamin D 1,25  dihydroxy     Status: Abnormal   Collection Time: 12/03/19 10:25 AM  Result Value Ref Range   Vitamin D 1, 25 (OH)2 Total 71 (H) pg/mL    Comment: Reference Range: Adults: 21 - 65    Vitamin D2 1, 25 (OH)2 44 pg/mL    Comment: This test was developed and its performance characteristics determined by [facility]. It has not been cleared or approved by the Food and Drug Administration.    Vitamin D3 1, 25 (OH)2 27 pg/mL    Comment: This test was developed and its performance characteristics determined by [facility]. It has not been cleared or approved by the Food and Drug Administration.   Novel Coronavirus, NAA ([facility])     Status: None   Collection Time: 12/06/19  8:11 AM   Specimen: Nasopharyngeal(NP) swabs in vial transport medium   Nasopharynge  Testing  Result Value Ref Range   SARS-CoV-2, NAA Not Detected Not Detected    Comment: This nucleic acid amplification test was developed and its performance characteristics determined by [employer]. Nucleic acid amplification tests include RT-PCR and TMA. This test has not been FDA cleared or approved. This test has been authorized by FDA under an Emergency Use Authorization (EUA). This test is only authorized for the duration of time the declaration that circumstances exist justifying  the authorization of the emergency use of in vitro diagnostic tests for detection of SARS-CoV-2 virus and/or diagnosis of COVID-19 infection under section 564(b)(1) of the Act, 21 U.S.C. 759FMB-8(G) (1), unless the authorization is terminated or revoked sooner. When diagnostic testing is negative, the possibility of a false negative result should be considered in the context of a patient's recent exposures and the presence of clinical signs and symptoms consistent with COVID-19. An individual without symptoms of COVID-19 and who is not shedding SARS-CoV-2 virus wo uld expect to have a negative (not detected) result in this assay.   SARS-COV-2,  NAA 2 DAY TAT     Status: None   Collection Time: 12/06/19  8:11 AM   Nasopharynge  Testing  Result Value Ref Range   SARS-CoV-2, NAA 2 DAY TAT Performed   Novel Coronavirus, NAA ([facility])     Status: None   Collection Time: 01/25/20  4:32 PM   Specimen: Nasopharyngeal(NP) swabs in vial transport medium   Nasopharynge  Screenin  Result Value Ref Range   SARS-CoV-2, NAA CANCELED     Comment: Test not performed. Insufficient specimen to perform or complete analysis.  Result canceled by the ancillary.   POCT Urinalysis Dipstick     Status: Abnormal   Collection Time: 02/21/20 10:28 AM  Result Value Ref Range   Color, UA gold    Clarity, UA     Glucose, UA Negative Negative   Bilirubin, UA neg    Ketones, UA neg    Spec Grav, UA >=1.030 (A) 1.010 - 1.025   Blood, UA lg    pH, UA 6.0 5.0 - 8.0   Protein, UA Positive (A) Negative    Comment: 1+   Urobilinogen, UA 0.2 0.2 or 1.0 E.U./dL   Nitrite, UA neg    Leukocytes, UA Negative Negative   Appearance     Odor    POCT urine pregnancy     Status: Normal   Collection Time: 02/21/20 10:29 AM  Result Value Ref Range   Preg Test, Ur Negative Negative          Assessment & Plan:  Cystitis with hematuria , Protenuria Meds ordered this encounter  Medications  . nitrofurantoin, macrocrystal-monohydrate, (MACROBID) 100 MG capsule    Sig: Take 1 capsule (100 mg total) by mouth 2 (two) times daily.    Dispense:  14 capsule    Refill:  0  return in two weeks after finishing antibiotics.  For a recheck of your urine. And Flu vaccine. Drink plenty of water and take  2 glasses of cranberry juice /day. If symptoms not improving in  3 days, please call the office. If another UTI occurs within  3-6 months will refer to Urology. Patient verbalizes understanding and has no questions at discharge.

## 2020-02-21 NOTE — Patient Instructions (Signed)
Urinary Tract Infection, Adult A urinary tract infection (UTI) is an infection of any part of the urinary tract. The urinary tract includes:  The kidneys.  The ureters.  The bladder.  The urethra. These organs make, store, and get rid of pee (urine) in the body. What are the causes? This is caused by germs (bacteria) in your genital area. These germs grow and cause swelling (inflammation) of your urinary tract. What increases the risk? You are more likely to develop this condition if:  You have a small, thin tube (catheter) to drain pee.  You cannot control when you pee or poop (incontinence).  You are female, and: ? You use these methods to prevent pregnancy:  A medicine that kills sperm (spermicide).  A device that blocks sperm (diaphragm). ? You have low levels of a female hormone (estrogen). ? You are pregnant.  You have genes that add to your risk.  You are sexually active.  You take antibiotic medicines.  You have trouble peeing because of: ? A prostate that is bigger than normal, if you are female. ? A blockage in the part of your body that drains pee from the bladder (urethra). ? A kidney stone. ? A nerve condition that affects your bladder (neurogenic bladder). ? Not getting enough to drink. ? Not peeing often enough.  You have other conditions, such as: ? Diabetes. ? A weak disease-fighting system (immune system). ? Sickle cell disease. ? Gout. ? Injury of the spine. What are the signs or symptoms? Symptoms of this condition include:  Needing to pee right away (urgently).  Peeing often.  Peeing small amounts often.  Pain or burning when peeing.  Blood in the pee.  Pee that smells bad or not like normal.  Trouble peeing.  Pee that is cloudy.  Fluid coming from the vagina, if you are female.  Pain in the belly or lower back. Other symptoms include:  Throwing up (vomiting).  No urge to eat.  Feeling mixed up (confused).  Being tired  and grouchy (irritable).  A fever.  Watery poop (diarrhea). How is this treated? This condition may be treated with:  Antibiotic medicine.  Other medicines.  Drinking enough water. Follow these instructions at home:  Medicines  Take over-the-counter and prescription medicines only as told by your doctor.  If you were prescribed an antibiotic medicine, take it as told by your doctor. Do not stop taking it even if you start to feel better. General instructions  Make sure you: ? Pee until your bladder is empty. ? Do not hold pee for a long time. ? Empty your bladder after sex. ? Wipe from front to back after pooping if you are a female. Use each tissue one time when you wipe.  Drink enough fluid to keep your pee pale yellow.  Keep all follow-up visits as told by your doctor. This is important. Contact a doctor if:  You do not get better after 1-2 days.  Your symptoms go away and then come back. Get help right away if:  You have very bad back pain.  You have very bad pain in your lower belly.  You have a fever.  You are sick to your stomach (nauseous).  You are throwing up. Summary  A urinary tract infection (UTI) is an infection of any part of the urinary tract.  This condition is caused by germs in your genital area.  There are many risk factors for a UTI. These include having a small, thin   tube to drain pee and not being able to control when you pee or poop.  Treatment includes antibiotic medicines for germs.  Drink enough fluid to keep your pee pale yellow. This information is not intended to replace advice given to you by your health care provider. Make sure you discuss any questions you have with your health care provider. Document Revised: 04/30/2018 Document Reviewed: 11/20/2017 Elsevier Patient Education  2020 Elsevier Inc.  

## 2020-02-21 NOTE — Progress Notes (Signed)
poct

## 2020-02-23 ENCOUNTER — Encounter: Payer: Self-pay | Admitting: Medical

## 2020-02-23 LAB — URINE CULTURE: Organism ID, Bacteria: NO GROWTH

## 2020-02-25 DIAGNOSIS — Z1371 Encounter for nonprocreative screening for genetic disease carrier status: Secondary | ICD-10-CM

## 2020-02-25 HISTORY — DX: Encounter for nonprocreative screening for genetic disease carrier status: Z13.71

## 2020-03-08 ENCOUNTER — Other Ambulatory Visit: Payer: Self-pay

## 2020-03-08 ENCOUNTER — Ambulatory Visit (INDEPENDENT_AMBULATORY_CARE_PROVIDER_SITE_OTHER): Payer: BC Managed Care – PPO | Admitting: Obstetrics and Gynecology

## 2020-03-08 ENCOUNTER — Other Ambulatory Visit: Payer: BC Managed Care – PPO

## 2020-03-08 ENCOUNTER — Encounter: Payer: Self-pay | Admitting: Obstetrics and Gynecology

## 2020-03-08 VITALS — BP 90/60 | Ht 60.0 in | Wt 129.0 lb

## 2020-03-08 DIAGNOSIS — Z130 Encounter for screening for diseases of the blood and blood-forming organs and certain disorders involving the immune mechanism: Secondary | ICD-10-CM

## 2020-03-08 DIAGNOSIS — Z01419 Encounter for gynecological examination (general) (routine) without abnormal findings: Secondary | ICD-10-CM | POA: Diagnosis not present

## 2020-03-08 DIAGNOSIS — Z800 Family history of malignant neoplasm of digestive organs: Secondary | ICD-10-CM | POA: Diagnosis not present

## 2020-03-08 DIAGNOSIS — Z1231 Encounter for screening mammogram for malignant neoplasm of breast: Secondary | ICD-10-CM | POA: Diagnosis not present

## 2020-03-08 DIAGNOSIS — Z0000 Encounter for general adult medical examination without abnormal findings: Secondary | ICD-10-CM

## 2020-03-08 DIAGNOSIS — Z8041 Family history of malignant neoplasm of ovary: Secondary | ICD-10-CM

## 2020-03-08 DIAGNOSIS — R5382 Chronic fatigue, unspecified: Secondary | ICD-10-CM

## 2020-03-08 DIAGNOSIS — Z1329 Encounter for screening for other suspected endocrine disorder: Secondary | ICD-10-CM

## 2020-03-08 DIAGNOSIS — Z1211 Encounter for screening for malignant neoplasm of colon: Secondary | ICD-10-CM

## 2020-03-08 NOTE — Patient Instructions (Signed)
I value your feedback and entrusting us with your care. If you get a [facility] patient survey, I would appreciate you taking the time to let us know about your experience today. Thank you!  As of May 06, 2019, your lab results will be released to your MyChart immediately, before I even have a chance to see them. Please give me time to review them and contact you if there are any abnormalities. Thank you for your patience.  

## 2020-03-08 NOTE — Progress Notes (Signed)
PCP:  Pleas Koch, NP   Chief Complaint  Patient presents with  . Gynecologic Exam     HPI:      Ms. LILIANI BOBO is a 44 y.o. [id number] who LMP was No LMP recorded. Patient has had an ablation., presents today for her annual examination.  Her menses are regular every 28-30 days, lasting 3 days of spotting/dark d/c only. Dysmenorrhea mild, occurring first 1-2 days of flow. She does not have intermenstrual bleeding. Menses significantly improved after endometrial ablation 12/19 with DR. Harris.  Sex activity: single partner, contraception - tubal ligation.  Last Pap: 02/05/16  Results were: no abnormalities /neg HPV DNA  Hx of STDs: none  Last mammogram: 12/31/19  Results were: normal--routine follow-up in 12 months There is a FH of breast cancer in her mat aunt. There is a FH of ovarian cancer in her mat aunt and pancreatic cancer in her MGM, genetic testing not done/pt declined past few yrs. Interested this yr. The patient does do self-breast exams. Has had LT breast tenderness this cycle. Minimal caffeine use. No masses.   Tobacco use: The patient denies current or previous tobacco use. Alcohol use: social drinker No drug use.  Exercise: not active  Colonoscopy: never  She does not get adequate calcium or get Vitamin D in her diet. Labs with PCP. Has had issues with fatigue past few months. Sleeps 8-9 hrs nightly, uninterrupted sleep. Denies depression sx. No recent covid sx. Had labs with PCP 4/21 but TSH not done. Pt tried oral B12 tabs without success. Not exercising.  Had UTI a couple wks ago with neg C&S. Pt had taken leftover abx before C&S sent. Completed 7 days macrobid with sx relief.   Past Medical History:  Diagnosis Date  . Anxiety and depression   . Asthma   . Bacterial vaginosis   . Family history of ovarian cancer 02/2019   MyRisk testing declined  . Family history of pancreatic cancer   . Medical history non-contributory   . Migraine   . UTI  (urinary tract infection)     Past Surgical History:  Procedure Laterality Date  . ABLATION    . APPENDECTOMY    . ESOPHAGOGASTRODUODENOSCOPY (EGD) WITH PROPOFOL N/A 10/05/2015   Procedure: ESOPHAGOGASTRODUODENOSCOPY (EGD) WITH PROPOFOL;  Surgeon: Hulen Luster, MD;  Location[facility];  Service: Gastroenterology;  Laterality: N/A;  . TUBAL LIGATION      Family History  Problem Relation Age of Onset  . Hypertension Mother   . Diabetes Father        DM Type 2  . Hypercholesterolemia Father   . Pancreatic cancer Maternal Grandmother 62  . Ovarian cancer Maternal Aunt 67  . Breast cancer Maternal Aunt     Social History   Socioeconomic History  . Marital status: Married    Spouse name: Not on file  . Number of children: Not on file  . Years of education: Not on file  . Highest education level: Not on file  Occupational History  . Not on file  Tobacco Use  . Smoking status: Never Smoker  . Smokeless tobacco: Never Used  Vaping Use  . Vaping Use: Never used  Substance and Sexual Activity  . Alcohol use: No  . Drug use: No  . Sexual activity: Yes    Birth control/protection: Surgical    Comment: tubal ligation  Other Topics Concern  . Not on file  Social History Narrative   Single.   4  children.   Works as a Administrator.   Enjoys spending time with family and friends.    Social Determinants of Health   Financial Resource Strain:   . Difficulty of Paying Living Expenses: Not on file  Food Insecurity:   . Worried About Charity fundraiser in the Last Year: Not on file  . Ran Out of Food in the Last Year: Not on file  Transportation Needs:   . Lack of Transportation (Medical): Not on file  . Lack of Transportation (Non-Medical): Not on file  Physical Activity:   . Days of Exercise per Week: Not on file  . Minutes of Exercise per Session: Not on file  Stress:   . Feeling of Stress : Not on file  Social Connections:   . Frequency of Communication with  Friends and Family: Not on file  . Frequency of Social Gatherings with Friends and Family: Not on file  . Attends Religious Services: Not on file  . Active Member of Clubs or Organizations: Not on file  . Attends Archivist Meetings: Not on file  . Marital Status: Not on file  Intimate Partner Violence:   . Fear of Current or Ex-Partner: Not on file  . Emotionally Abused: Not on file  . Physically Abused: Not on file  . Sexually Abused: Not on file    Current Meds  Medication Sig  . ibuprofen (ADVIL) 800 MG tablet Take 1 tablet (800 mg total) by mouth every 8 (eight) hours as needed.  . Vitamin D, Ergocalciferol, (DRISDOL) 1.25 MG (50000 UNIT) CAPS capsule Take 1 capsule (50,000 Units total) by mouth every 7 (seven) days. X 3 months.     ROS:  Review of Systems  Constitutional: Positive for fatigue. Negative for fever and unexpected weight change.  Respiratory: Negative for cough, shortness of breath and wheezing.   Cardiovascular: Negative for chest pain, palpitations and leg swelling.  Gastrointestinal: Negative for blood in stool, constipation, diarrhea, nausea and vomiting.  Endocrine: Negative for cold intolerance, heat intolerance and polyuria.  Genitourinary: Positive for dysuria. Negative for dyspareunia, flank pain, frequency, genital sores, hematuria, menstrual problem, pelvic pain, urgency, vaginal bleeding, vaginal discharge and vaginal pain.  Musculoskeletal: Negative for back pain, joint swelling and myalgias.  Skin: Negative for rash.  Neurological: Negative for dizziness, syncope, light-headedness, numbness and headaches.  Hematological: Negative for adenopathy.  Psychiatric/Behavioral: Positive for agitation. Negative for confusion, sleep disturbance and suicidal ideas. The patient is not nervous/anxious.      Objective: BP 90/60   Ht 5' (1.524 m)   Wt 129 lb (58.5 kg)   BMI 25.19 kg/m    Physical Exam Constitutional:      Appearance: She is  well-developed.  Genitourinary:     Vulva, vagina, uterus, right adnexa and left adnexa normal.     No vulval lesion or tenderness noted.     No vaginal discharge, erythema or tenderness.     No cervical motion tenderness or polyp.     Uterus is not enlarged or tender.     No right or left adnexal mass present.     Right adnexa not tender.     Left adnexa not tender.  Neck:     Thyroid: No thyromegaly.  Cardiovascular:     Rate and Rhythm: Normal rate and regular rhythm.     Heart sounds: Normal heart sounds. No murmur heard.   Pulmonary:     Effort: Pulmonary effort is normal.  Breath sounds: Normal breath sounds.  Chest:     Breasts:        Right: No mass, nipple discharge, skin change or tenderness.        Left: No mass, nipple discharge, skin change or tenderness.  Abdominal:     Palpations: Abdomen is soft.     Tenderness: There is no abdominal tenderness. There is no guarding.  Musculoskeletal:        General: Normal range of motion.     Cervical back: Normal range of motion.  Neurological:     General: No focal deficit present.     Mental Status: She is alert and oriented to person, place, and time.     Cranial Nerves: No cranial nerve deficit.  Skin:    General: Skin is warm and dry.  Psychiatric:        Mood and Affect: Mood normal.        Behavior: Behavior normal.        Thought Content: Thought content normal.        Judgment: Judgment normal.  Vitals reviewed.     Assessment/Plan: Encounter for annual routine gynecological examination  Encounter for screening mammogram for malignant neoplasm of breast; pt current on mammo  Family history of ovarian cancer - Plan: Integrated [facility] ([place]); My Risk testing discussed and done today. Will call with results.   Family history of pancreatic cancer - Plan: Integrated [facility] ([place])  Screening for colon cancer--colonoscopy/cologuard discussed. Pt  declines testing for now, wants to think about it. Will f/u prn.   Blood tests for routine general physical examination - Plan: TSH + free T4, B12 and Folate Panel  Chronic fatigue--check labs. Will call with results. Increase exercise.   Thyroid disorder screening - Plan: TSH + free T4  Screening for deficiency anemia - Plan: B12 and Folate Panel   GYN counsel mammography screening, adequate intake of calcium and vitamin D, diet and exercise     F/U  Return in about 1 day (around 03/09/2020) for [facility] (paperwork already completed)/[facility] labs./1 yr annual.  Elmo Putt B. Copland, PA-C 03/08/2020 4:47 PM

## 2020-03-09 ENCOUNTER — Other Ambulatory Visit: Payer: BC Managed Care – PPO

## 2020-03-09 ENCOUNTER — Encounter: Payer: Self-pay | Admitting: Obstetrics and Gynecology

## 2020-03-09 ENCOUNTER — Ambulatory Visit: Payer: BC Managed Care – PPO

## 2020-03-09 DIAGNOSIS — R5383 Other fatigue: Secondary | ICD-10-CM | POA: Diagnosis not present

## 2020-03-09 DIAGNOSIS — Z0000 Encounter for general adult medical examination without abnormal findings: Secondary | ICD-10-CM

## 2020-03-09 DIAGNOSIS — Z8041 Family history of malignant neoplasm of ovary: Secondary | ICD-10-CM | POA: Diagnosis not present

## 2020-03-09 DIAGNOSIS — Z1329 Encounter for screening for other suspected endocrine disorder: Secondary | ICD-10-CM

## 2020-03-09 DIAGNOSIS — Z803 Family history of malignant neoplasm of breast: Secondary | ICD-10-CM | POA: Diagnosis not present

## 2020-03-09 DIAGNOSIS — Z130 Encounter for screening for diseases of the blood and blood-forming organs and certain disorders involving the immune mechanism: Secondary | ICD-10-CM

## 2020-03-09 DIAGNOSIS — Z808 Family history of malignant neoplasm of other organs or systems: Secondary | ICD-10-CM | POA: Diagnosis not present

## 2020-03-10 LAB — TSH+FREE T4
Free T4: 1.19 ng/dL (ref 0.82–1.77)
TSH: 1.45 u[IU]/mL (ref 0.450–4.500)

## 2020-03-10 LAB — B12 AND FOLATE PANEL
Folate: 9.3 ng/mL (ref >3.0)
Vitamin B-12: 413 pg/mL (ref 232–1245)

## 2020-03-23 ENCOUNTER — Encounter: Payer: Self-pay | Admitting: Obstetrics and Gynecology

## 2020-03-25 DIAGNOSIS — Z20822 Contact with and (suspected) exposure to covid-19: Secondary | ICD-10-CM | POA: Diagnosis not present

## 2020-03-30 ENCOUNTER — Telehealth: Payer: Self-pay | Admitting: Obstetrics and Gynecology

## 2020-03-30 NOTE — Telephone Encounter (Signed)
Pt aware of neg MyRIsk results except NTHL1 VUS.  IBIS=7.4%/riskscore=7.2%.  Patient understands these results only apply to her and her children, and this is not indicative of genetic testing results of her other family members. It is recommended that her other family members have genetic testing done.  Pt also understands negative genetic testing doesn't mean she will never get any of these cancers.   Hard copy mailed to pt. F/u prn.

## 2020-04-17 ENCOUNTER — Encounter: Payer: Self-pay | Admitting: Physical Therapy

## 2020-04-17 DIAGNOSIS — M545 Low back pain, unspecified: Secondary | ICD-10-CM

## 2020-04-17 NOTE — Therapy (Signed)
[facility] [address] [place], [address] Phone: [phone]   Fax:  [phone]  Physical Therapy No-Visit Discharge Summary Reporting period: 01/04/2020 - 04/17/2020  Patient Details  Name: Cassandra Pham MRN: [id number] Date of Birth: [birth year] Referring Provider (PT): Apolonio Schneiders, FNP    Encounter Date: 04/17/2020    Past Medical History:  Diagnosis Date   Anxiety and depression    Asthma    Bacterial vaginosis    BRCA negative 02/2020   MyRisk neg except NTHL1 VUS; IBIS=7.4%/riskscore=7.2%   Family history of ovarian cancer 02/2019   MyRisk testing declined   Family history of pancreatic cancer    Medical history non-contributory    Migraine    UTI (urinary tract infection)     Past Surgical History:  Procedure Laterality Date   ABLATION     APPENDECTOMY     ESOPHAGOGASTRODUODENOSCOPY (EGD) WITH PROPOFOL N/A 10/05/2015   Procedure: ESOPHAGOGASTRODUODENOSCOPY (EGD) WITH PROPOFOL;  Surgeon: Hulen Luster, MD;  Location: [facility] ENDOSCOPY;  Service: Gastroenterology;  Laterality: N/A;   TUBAL LIGATION      There were no vitals filed for this visit.   Subjective Assessment - 04/17/20 1434    Subjective Patient needed to discontinue PT due to family issues taking priority.    Pertinent History Patient is a 44 y.o. female who presents to outpatient physical therapy with a referral for medical diagnosis acute bilateral low back pain without sciatica. This patient's chief complaints consist of intermittent low back pain R > L with insidious onset about 2 months ago, leading to the following functional deficits: difficulty with sleeping, working, prolonged sitting, prolonged walking, bending, lifting, playing with her 51 year old granddaughter, cleaning, etc. Relevant past medical history and comorbidities include hx anxiety/depression, left UE problems (mild hindrance now), migraine.   Patient denies hx of cancer, stroke, seizures, lung problem, major cardiac events, diabetes, unexplained weight loss, changes in bowel or bladder problems, new onset stumbling or dropping things.    Limitations Sitting;Lifting;Standing;House hold activities;Other (comment)   difficulty with sleeping, working, prolonged sitting, prolonged walking, bending, lifting, playing with her 72 year old granddaughter, cleaning, etc.   How long can you sit comfortably? 2 hours    How long can you stand comfortably? 30 min    Diagnostic tests pt reports recent x-ray was normal. Lumbar MRI report 08/22/2015: "IMPRESSION:Normal MRI of the lumbar spine. No findings to explain left lowerextremity symptoms identified."    Patient Stated Goals want my back to get better           OBJECTIVE Patient is not present for examination at this time. Please see previous documentation for latest objective data.      PT Short Term Goals - 04/17/20 1446      PT SHORT TERM GOAL #1   Title Be independent with initial home exercise program for self-management of symptoms.    Baseline Initial HEP provided at IE (01/04/2020);    Time 2    Period Weeks    Status Not Met             PT Long Term Goals - 04/17/20 1446      PT LONG TERM GOAL #1   Title Be independent with a long-term home exercise program for self-management of symptoms.    Baseline Initial HEP provided at IE (01/04/2020);    Time 12    Period Weeks    Status Not Met  TARGET DATE FOR ALL LONG TERM GOALS: 03/28/2020     PT LONG TERM GOAL #2   Title Demonstrate improved FOTO score to equal or greater than 73 by visit #10 to demonstrate improvement in overall condition and self-reported functional ability.    Baseline 57 (01/04/2020);    Time 12    Period Weeks    Status Not Met      PT LONG TERM GOAL #3   Title Have full lumbar AROM with no compensations or increase in pain in all planes except intermittent end range discomfort to allow patient to  complete valued activities with less difficulty.    Baseline limited and painful - see objective exam (01/04/2020);    Time 12    Period Weeks    Status Not Met      PT LONG TERM GOAL #4   Title Reduce pain with functional activities to equal or less than 1/10 to allow patient to complete usual activities including ADLs, IADLs, and social engagement with less difficulty.    Baseline up to 10/10 (01/04/2020);    Time 12    Period Weeks    Status Not Met      PT LONG TERM GOAL #5   Title Complete community, work and/or recreational activities without limitation due to current condition.    Baseline Functional Limitations: when her back hurts it makes her lays down, has had to leave work, bothers her with tasks that require bending down including cleaning, disrupts sleep, makes it hard to pick up granddaughter (3y/o) (01/04/2020);    Time 12    Period Weeks    Status Not Met                Plan - 04/17/20 1501    Clinical Impression Statement Patient attended initial eval only and then canceled all future appointments due to family issues that prevented her attendance. Was unable to work towards stated goals and is now discharged from PT.    Personal Factors and Comorbidities Age;Behavior Pattern;Comorbidity 3+;Profession;Time since onset of injury/illness/exacerbation    Comorbidities Relevant past medical history and comorbidities include hx anxiety/depression, left UE problems (mild hindrance now), migraine.    Examination-Activity Limitations Lift;Bend;Caring for Others;Squat;Stand;Transfers;Sit    Examination-Participation Restrictions Occupation;Driving;Community Activity;Cleaning;Interpersonal Relationship    Stability/Clinical Decision Making Evolving/Moderate complexity    Rehab Potential Good    PT Frequency 2x / week    PT Duration 12 weeks    PT Treatment/Interventions ADLs/Self Care Home Management;Cryotherapy;Moist Heat;Therapeutic activities;Therapeutic  exercise;Neuromuscular re-education;Patient/family education;Manual techniques;Dry needling;Passive range of motion;Spinal Manipulations;Joint Manipulations    PT Next Visit Plan Patient is now discharged from [facility] Access Code: [email or web address]    Consulted and Agree with Plan of Care Patient           Patient will benefit from skilled therapeutic intervention in order to improve the following deficits and impairments:  Pain, Decreased mobility, Increased muscle spasms, Decreased activity tolerance, Decreased endurance, Decreased range of motion, Decreased strength, Impaired perceived functional ability, Impaired flexibility, Difficulty walking  Visit Diagnosis: Bilateral low back pain without sciatica, unspecified chronicity     Problem List Patient Active Problem List   Diagnosis Date Noted   Family history of pancreatic cancer 03/08/2020   Numbness and tingling in left arm 04/08/2018   Acute pain of left shoulder 01/30/2018   Chronic migraine without aura without status migrainosus, not intractable 01/30/2018   Asthma 01/30/2018   Anxiety and depression 01/30/2018  Anxiety and depression 01/30/2018   Asthma 01/30/2018   Chronic migraine without aura without status migrainosus, not intractable 01/30/2018   Family history of ovarian cancer 02/25/2017   Gout 10/02/2016    Everlean Alstrom. Graylon Good, PT, DPT 04/17/20, 3:02 PM  [facility] [address] [place], [address] Phone: [phone]   Fax:  [phone]  Name: Cassandra Pham MRN: [id number] Date of Birth: [birth year]

## 2020-04-23 DIAGNOSIS — Z20822 Contact with and (suspected) exposure to covid-19: Secondary | ICD-10-CM | POA: Diagnosis not present

## 2020-04-24 DIAGNOSIS — Z20822 Contact with and (suspected) exposure to covid-19: Secondary | ICD-10-CM | POA: Diagnosis not present

## 2020-05-04 ENCOUNTER — Other Ambulatory Visit: Payer: Self-pay

## 2020-05-04 ENCOUNTER — Ambulatory Visit: Payer: BC Managed Care – PPO | Admitting: Nurse Practitioner

## 2020-05-04 ENCOUNTER — Ambulatory Visit: Payer: BC Managed Care – PPO

## 2020-05-04 VITALS — BP 115/84 | HR 85 | Resp 14

## 2020-05-04 DIAGNOSIS — J029 Acute pharyngitis, unspecified: Secondary | ICD-10-CM

## 2020-05-04 DIAGNOSIS — Z20822 Contact with and (suspected) exposure to covid-19: Secondary | ICD-10-CM

## 2020-05-04 LAB — POC COVID19 BINAXNOW: SARS Coronavirus 2 Ag: NEGATIVE

## 2020-05-04 LAB — POCT RAPID STREP A (OFFICE): Rapid Strep A Screen: NEGATIVE

## 2020-05-04 NOTE — Progress Notes (Signed)
   Subjective:    Patient ID: Cassandra Pham, female    DOB: Jun 01, 1975, 44 y.o.   MRN: [id number]  HPI  44 year old female presents with sore throat, pain with swallowing, and a cough for 24 hours. She was around her granddaughter this week who has a cough and has not been to the doctor that she knows of. Her daughter is also sick and is currently at [facility].   Denies fever. Has not taken anything OTC.   Patient had Rapid COVID-19 antigen testing (negative) prior to appointment today.   Review of Systems  Constitutional: Negative.   HENT: Positive for rhinorrhea and sore throat.   Respiratory: Positive for cough.   Cardiovascular: Negative.   Genitourinary: Negative.   Musculoskeletal: Negative.   Neurological: Negative.        Objective:   Physical Exam Constitutional:      Appearance: Normal appearance.  HENT:     Head: Normocephalic.     Right Ear: Tympanic membrane, ear canal and external ear normal.     Left Ear: Tympanic membrane, ear canal and external ear normal.     Nose: Rhinorrhea present.     Mouth/Throat:     Mouth: Mucous membranes are moist.     Pharynx: Oropharynx is clear.  Cardiovascular:     Rate and Rhythm: Normal rate and regular rhythm.     Heart sounds: Normal heart sounds.  Pulmonary:     Effort: Pulmonary effort is normal.     Breath sounds: Normal breath sounds.  Neurological:     Mental Status: She is alert and oriented to person, place, and time.  Psychiatric:        Mood and Affect: Mood normal.   Rapid Strep A Screen POCT rapid strep A Collected: 05/04/20 1504  Result status: Final  Reference range: Negative  Value: Negative       Assessment & Plan:  Discussed viral symptoms with patient, likely viral URI similar to family members.   If Sore throat persists without further development of congestion or cough RTC as discussed for further workup.   Advised OTC decongestant and pain medication (Dayquil/Nyquil) for relief. Push  fluids rest, vitamin C for immune support.   OK to return to work unless fever develops, continue to wear mask and practice social distancing as is campus policy.

## 2020-05-10 DIAGNOSIS — Z20822 Contact with and (suspected) exposure to covid-19: Secondary | ICD-10-CM | POA: Diagnosis not present

## 2020-05-11 DIAGNOSIS — Z20822 Contact with and (suspected) exposure to covid-19: Secondary | ICD-10-CM | POA: Diagnosis not present

## 2020-05-23 ENCOUNTER — Ambulatory Visit: Payer: BC Managed Care – PPO | Admitting: Nurse Practitioner

## 2020-05-23 ENCOUNTER — Other Ambulatory Visit: Payer: Self-pay

## 2020-05-23 ENCOUNTER — Telehealth: Payer: BC Managed Care – PPO | Admitting: Nurse Practitioner

## 2020-05-23 DIAGNOSIS — Z20822 Contact with and (suspected) exposure to covid-19: Secondary | ICD-10-CM

## 2020-05-23 DIAGNOSIS — U071 COVID-19: Secondary | ICD-10-CM

## 2020-05-23 LAB — POC COVID19 BINAXNOW: SARS Coronavirus 2 Ag: POSITIVE — AB

## 2020-05-23 MED ORDER — ALBUTEROL SULFATE HFA 108 (90 BASE) MCG/ACT IN AERS: 2.0000 | INHALATION_SPRAY | Freq: Four times a day (QID) | RESPIRATORY_TRACT | 0 refills | Status: DC | PRN

## 2020-05-23 NOTE — Progress Notes (Signed)
   Subjective:    Patient ID: Cassandra Pham, female    DOB: August 15, 1975, 44 y.o.   MRN: [id number]  HPI Patient presents for virtual appointment today after COVID-19 testing.  She started to have symptoms 05/19/20   Got an e-mail that she was a close contact on 05/16/20 to someone who later tested positive for COVID  Today she has loss of taste, body aches, fever and congestion.    Review of Systems  Constitutional: Positive for chills, fatigue and fever.  HENT: Positive for congestion and postnasal drip.   Respiratory: Positive for cough.   Genitourinary: Negative.   Musculoskeletal: Positive for myalgias.   She was vaccinated > 6 months ago has not had a booster      Objective:   Physical Exam  This was a telehealth appointment with patient after COVID-19 testing with nurse,   No acute distress assessed during phone conversation with patient   Component Ref Range & Units 09:53 2 wk ago  SARS Coronavirus 2 Ag Negative PositiveAbnormal  Negative CM   Comment: Patient vaccinated. Patient aware of positive result. Virtual follow up with S.Tennile Styles         Specimen Collected: 05/23/20 09:53 Last Resulted: 05/23/20 09:53           Assessment & Plan:  Referred for monoclonal antibody infusion at [facility] since vaccine status is > 6 months History of asthma  Refilled albuterol inhaler and instructed to use as needed as directed Continue OTC decongestant and cough suppressant for support of symptoms Alternate tylenol and advil for fever control    RTC if symptoms persist or worsen   Potential return to work date will be 05/30/19  She will be scheduled for a virtual appointment that morning to determine ability to return to work based on symptoms.   Meds ordered this encounter  Medications  . albuterol (VENTOLIN HFA) 108 (90 Base) MCG/ACT inhaler    Sig: Inhale 2 puffs into the lungs every 6 (six) hours as needed for wheezing or shortness of breath.    Dispense:  8  g    Refill:  0

## 2020-05-29 ENCOUNTER — Other Ambulatory Visit: Payer: Self-pay

## 2020-05-29 ENCOUNTER — Telehealth: Payer: BC Managed Care – PPO | Admitting: Medical

## 2020-05-29 ENCOUNTER — Encounter: Payer: Self-pay | Admitting: Medical

## 2020-05-29 DIAGNOSIS — R062 Wheezing: Secondary | ICD-10-CM

## 2020-05-29 DIAGNOSIS — H6122 Impacted cerumen, left ear: Secondary | ICD-10-CM

## 2020-05-29 DIAGNOSIS — J22 Unspecified acute lower respiratory infection: Secondary | ICD-10-CM

## 2020-05-29 MED ORDER — PREDNISONE 10 MG (21) PO TBPK: ORAL_TABLET | ORAL | 0 refills | Status: DC

## 2020-05-29 MED ORDER — AZITHROMYCIN 250 MG PO TABS: ORAL_TABLET | ORAL | 0 refills | Status: DC

## 2020-05-29 NOTE — Patient Instructions (Signed)
COVID-19 COVID-19 is a respiratory infection that is caused by a virus called severe acute respiratory syndrome coronavirus 2 (SARS-CoV-2). The disease is also known as coronavirus disease or novel coronavirus. In some people, the virus may not cause any symptoms. In others, it may cause a serious infection. The infection can get worse quickly and can lead to complications, such as:  Pneumonia, or infection of the lungs.  Acute respiratory distress syndrome or ARDS. This is a condition in which fluid build-up in the lungs prevents the lungs from filling with air and passing oxygen into the blood.  Acute respiratory failure. This is a condition in which there is not enough oxygen passing from the lungs to the body or when carbon dioxide is not passing from the lungs out of the body.  Sepsis or septic shock. This is a serious bodily reaction to an infection.  Blood clotting problems.  Secondary infections due to bacteria or fungus.  Organ failure. This is when your body's organs stop working. The virus that causes COVID-19 is contagious. This means that it can spread from person to person through droplets from coughs and sneezes (respiratory secretions). What are the causes? This illness is caused by a virus. You may catch the virus by:  Breathing in droplets from an infected person. Droplets can be spread by a person breathing, speaking, singing, coughing, or sneezing.  Touching something, like a table or a doorknob, that was exposed to the virus (contaminated) and then touching your mouth, nose, or eyes. What increases the risk? Risk for infection You are more likely to be infected with this virus if you:  Are within 6 feet (2 meters) of a person with COVID-19.  Provide care for or live with a person who is infected with COVID-19.  Spend time in crowded indoor spaces or live in shared housing. Risk for serious illness You are more likely to become seriously ill from the virus if you:   Are 50 years of age or older. The higher your age, the more you are at risk for serious illness.  Live in a nursing home or long-term care facility.  Have cancer.  Have a long-term (chronic) disease such as: ? Chronic lung disease, including chronic obstructive pulmonary disease or asthma. ? A long-term disease that lowers your body's ability to fight infection (immunocompromised). ? Heart disease, including heart failure, a condition in which the arteries that lead to the heart become narrow or blocked (coronary artery disease), a disease which makes the heart muscle thick, weak, or stiff (cardiomyopathy). ? Diabetes. ? Chronic kidney disease. ? Sickle cell disease, a condition in which red blood cells have an abnormal "sickle" shape. ? Liver disease.  Are obese. What are the signs or symptoms? Symptoms of this condition can range from mild to severe. Symptoms may appear any time from 2 to 14 days after being exposed to the virus. They include:  A fever or chills.  A cough.  Difficulty breathing.  Headaches, body aches, or muscle aches.  Runny or stuffy (congested) nose.  A sore throat.  New loss of taste or smell. Some people may also have stomach problems, such as nausea, vomiting, or diarrhea. Other people may not have any symptoms of COVID-19. How is this diagnosed? This condition may be diagnosed based on:  Your signs and symptoms, especially if: ? You live in an area with a COVID-19 outbreak. ? You recently traveled to or from an area where the virus is common. ? You   provide care for or live with a person who was diagnosed with COVID-19. ? You were exposed to a person who was diagnosed with COVID-19.  A physical exam.  Lab tests, which may include: ? Taking a sample of fluid from the back of your nose and throat (nasopharyngeal fluid), your nose, or your throat using a swab. ? A sample of mucus from your lungs (sputum). ? Blood tests.  Imaging tests, which  may include, X-rays, CT scan, or ultrasound. How is this treated? At present, there is no medicine to treat COVID-19. Medicines that treat other diseases are being used on a trial basis to see if they are effective against COVID-19. Your health care provider will talk with you about ways to treat your symptoms. For most people, the infection is mild and can be managed at home with rest, fluids, and over-the-counter medicines. Treatment for a serious infection usually takes places in a hospital intensive care unit (ICU). It may include one or more of the following treatments. These treatments are given until your symptoms improve.  Receiving fluids and medicines through an IV.  Supplemental oxygen. Extra oxygen is given through a tube in the nose, a face mask, or a hood.  Positioning you to lie on your stomach (prone position). This makes it easier for oxygen to get into the lungs.  Continuous positive airway pressure (CPAP) or bi-level positive airway pressure (BPAP) machine. This treatment uses mild air pressure to keep the airways open. A tube that is connected to a motor delivers oxygen to the body.  Ventilator. This treatment moves air into and out of the lungs by using a tube that is placed in your windpipe.  Tracheostomy. This is a procedure to create a hole in the neck so that a breathing tube can be inserted.  Extracorporeal membrane oxygenation (ECMO). This procedure gives the lungs a chance to recover by taking over the functions of the heart and lungs. It supplies oxygen to the body and removes carbon dioxide. Follow these instructions at home: Lifestyle  If you are sick, stay home except to get medical care. Your health care provider will tell you how long to stay home. Call your health care provider before you go for medical care.  Rest at home as told by your health care provider.  Do not use any products that contain nicotine or tobacco, such as cigarettes, e-cigarettes, and  chewing tobacco. If you need help quitting, ask your health care provider.  Return to your normal activities as told by your health care provider. Ask your health care provider what activities are safe for you. General instructions  Take over-the-counter and prescription medicines only as told by your health care provider.  Drink enough fluid to keep your urine pale yellow.  Keep all follow-up visits as told by your health care provider. This is important. How is this prevented?  There is no vaccine to help prevent COVID-19 infection. However, there are steps you can take to protect yourself and others from this virus. To protect yourself:   Do not travel to areas where COVID-19 is a risk. The areas where COVID-19 is reported change often. To identify high-risk areas and travel restrictions, check the CDC travel website: wwwnc.cdc.gov/travel/notices  If you live in, or must travel to, an area where COVID-19 is a risk, take precautions to avoid infection. ? Stay away from people who are sick. ? Wash your hands often with soap and water for 20 seconds. If soap and water   are not available, use an alcohol-based hand sanitizer. ? Avoid touching your mouth, face, eyes, or nose. ? Avoid going out in public, follow guidance from your state and local health authorities. ? If you must go out in public, wear a cloth face covering or face mask. Make sure your mask covers your nose and mouth. ? Avoid crowded indoor spaces. Stay at least 6 feet (2 meters) away from others. ? Disinfect objects and surfaces that are frequently touched every day. This may include:  Counters and tables.  Doorknobs and light switches.  Sinks and faucets.  Electronics, such as phones, remote controls, keyboards, computers, and tablets. To protect others: If you have symptoms of COVID-19, take steps to prevent the virus from spreading to others.  If you think you have a COVID-19 infection, contact your health care  provider right away. Tell your health care team that you think you may have a COVID-19 infection.  Stay home. Leave your house only to seek medical care. Do not use public transport.  Do not travel while you are sick.  Wash your hands often with soap and water for 20 seconds. If soap and water are not available, use alcohol-based hand sanitizer.  Stay away from other members of your household. Let healthy household members care for children and pets, if possible. If you have to care for children or pets, wash your hands often and wear a mask. If possible, stay in your own room, separate from others. Use a different bathroom.  Make sure that all people in your household wash their hands well and often.  Cough or sneeze into a tissue or your sleeve or elbow. Do not cough or sneeze into your hand or into the air.  Wear a cloth face covering or face mask. Make sure your mask covers your nose and mouth. Where to find more information  Centers for Disease Control and Prevention: www.cdc.gov/coronavirus/2019-ncov/index.html  World Health Organization: www.who.int/health-topics/coronavirus Contact a health care provider if:  You live in or have traveled to an area where COVID-19 is a risk and you have symptoms of the infection.  You have had contact with someone who has COVID-19 and you have symptoms of the infection. Get help right away if:  You have trouble breathing.  You have pain or pressure in your chest.  You have confusion.  You have bluish lips and fingernails.  You have difficulty waking from sleep.  You have symptoms that get worse. These symptoms may represent a serious problem that is an emergency. Do not wait to see if the symptoms will go away. Get medical help right away. Call your local emergency services (911 in the [place] Do not drive yourself to the hospital. Let the emergency medical personnel know if you think you have COVID-19. Summary  COVID-19 is a  respiratory infection that is caused by a virus. It is also known as coronavirus disease or novel coronavirus. It can cause serious infections, such as pneumonia, acute respiratory distress syndrome, acute respiratory failure, or sepsis.  The virus that causes COVID-19 is contagious. This means that it can spread from person to person through droplets from breathing, speaking, singing, coughing, or sneezing.  You are more likely to develop a serious illness if you are 50 years of age or older, have a weak immune system, live in a nursing home, or have chronic disease.  There is no medicine to treat COVID-19. Your health care provider will talk with you about ways to treat your symptoms.    Take steps to protect yourself and others from infection. Wash your hands often and disinfect objects and surfaces that are frequently touched every day. Stay away from people who are sick and wear a mask if you are sick. This information is not intended to replace advice given to you by your health care provider. Make sure you discuss any questions you have with your health care provider. Document Revised: 03/12/2019 Document Reviewed: 06/18/2018 Elsevier Patient Education  2020 [employer]  

## 2020-05-29 NOTE — Progress Notes (Signed)
Subjective:    Patient ID: Cassandra Pham, female    DOB: 1975-11-16, 45 y.o.   MRN: [id number]  HPI 45 yo female in non acute distress, consents to telemedicine appointment. Fever on and off  102 last night took Tylenol with relief.oughing , productive cough and wheezing and shortness of breath , some chest tightness. Using Albuterol inhaler as directed. Loss of taste and smell. Symptoms started 12/24. trouble hearing out of left ear thinks it is wax build up. No pain.  Drinking water and orange juice trying to stay hydrated.Marland Kitchen  Hx of Covid-19 vaccination > 6 months has not had booster.  Current Outpatient Medications:  .  albuterol (VENTOLIN HFA) 108 (90 Base) MCG/ACT inhaler, Inhale 2 puffs into the lungs every 6 (six) hours as needed for wheezing or shortness of breath., Disp: 8 g, Rfl: 0 .  ibuprofen (ADVIL) 800 MG tablet, Take 1 tablet (800 mg total) by mouth every 8 (eight) hours as needed., Disp: 30 tablet, Rfl: 0 .  Vitamin D, Ergocalciferol, (DRISDOL) 1.25 MG (50000 UNIT) CAPS capsule, Take 1 capsule (50,000 Units total) by mouth every 7 (seven) days. X 3 months., Disp: 12 capsule, Rfl: 0  Allergies  Allergen Reactions  . Ginger Hives  . Amoxil [Amoxicillin] Hives    Review of Systems  Constitutional: Positive for fatigue and fever. Negative for chills.  HENT: Positive for sore throat. Negative for congestion, ear pain, postnasal drip, rhinorrhea, sinus pressure, sinus pain and sneezing.        Decreased heaingg  on the left side. Had wax build up about a year ago.  Respiratory: Positive for cough, chest tightness (little bit), shortness of breath and wheezing.        Cough productive yellow - brownish color  Gastrointestinal: Positive for constipation ( tried yesterday but could not go). Negative for abdominal distention, blood in stool and nausea.  Genitourinary: Negative for difficulty urinating.  Musculoskeletal: Negative for myalgias.  Skin: Negative for  color change and rash.  Allergic/Immunologic: Positive for environmental allergies.  Neurological: Positive for headaches. Negative for dizziness, syncope and light-headedness.  Hematological: Does not bruise/bleed easily.       Objective:   Physical Exam  Clearing throat on phone call. AXOX3 No physical performed due to telemedicine appointment.      Assessment & Plan:  Covid-19 Infection, Fever 102 last night ? Pneumonia placed on Z-Pak sent for chest X-ray  [address] [employer].. Wheezing Placed on Prednisone taper. Cough tke OTC Delsym Ear clogged feeling , treat with OTC Debroxs per package instructions. Recommend patient to get Oximeter to monitor oxygen. If lower then 96 to call the office. OTC Ducolax for constipation, most likely due to decrease input. OTC Zyrtec to help dry up post nasal drip. Patient verbalizes understanding and has no questions at the end of our conversation. Meds ordered this encounter  Medications  . azithromycin (ZITHROMAX Z-PAK) 250 MG tablet    Sig: Take two  Tablets today then one tablet days 2-5, take with food    Dispense:  6 tablet    Refill:  0  . predniSONE (STERAPRED UNI-PAK 21 TAB) 10 MG (21) TBPK tablet    Sig: Take 6 tablets by mouth today then 5 tablets tomorrow then one less pill every day thereafter. Take with food.    Dispense:  21 tablet    Refill:  0   Orders Placed This Encounter  Procedures  . DG Chest 2 View  Order Specific Question:   Reason for Exam (SYMPTOM  OR DIAGNOSIS REQUIRED)    Answer:   wheezing , cough , fever 102    Order Specific Question:   Is patient pregnant?    Answer:   No    Order Specific Question:   Preferred imaging location?    Answer:   [facility]    Order Specific Question:   Call Results- Best Contact Number?    Answer:   [phone]

## 2020-05-30 ENCOUNTER — Other Ambulatory Visit: Payer: Self-pay

## 2020-05-30 ENCOUNTER — Ambulatory Visit
Admission: RE | Admit: 2020-05-30 | Discharge: 2020-05-30 | Disposition: A | Discharge status: Home / Self Care | Payer: BC Managed Care – PPO | Source: Ambulatory Visit | Attending: Medical | Admitting: Medical

## 2020-05-30 DIAGNOSIS — R059 Cough, unspecified: Secondary | ICD-10-CM | POA: Diagnosis not present

## 2020-05-31 ENCOUNTER — Telehealth: Payer: BC Managed Care – PPO | Admitting: Medical

## 2020-05-31 DIAGNOSIS — H9202 Otalgia, left ear: Secondary | ICD-10-CM

## 2020-05-31 MED ORDER — LEVOFLOXACIN 500 MG PO TABS: 500.0000 mg | ORAL_TABLET | Freq: Every day | ORAL | 0 refills | Status: DC

## 2020-05-31 NOTE — Patient Instructions (Signed)
Bland Diet A bland diet consists of foods that are often soft and do not have a lot of fat, fiber, or extra seasonings. Foods without fat, fiber, or seasoning are easier for the body to digest. They are also less likely to irritate your mouth, throat, stomach, and other parts of your digestive system. A bland diet is sometimes called a BRAT diet. What is my plan? Your health care provider or food and nutrition specialist (dietitian) may recommend specific changes to your diet to prevent symptoms or to treat your symptoms. These changes may include:  Eating small meals often.  Cooking food until it is soft enough to chew easily.  Chewing your food well.  Drinking fluids slowly.  Not eating foods that are very spicy, sour, or fatty.  Not eating citrus fruits, such as oranges and grapefruit. What do I need to know about this diet?  Eat a variety of foods from the bland diet food list.  Do not follow a bland diet longer than needed.  Ask your health care provider whether you should take vitamins or supplements. What foods can I eat? Grains  Hot cereals, such as cream of wheat. Rice. Bread, crackers, or tortillas made from refined white flour. Vegetables Canned or cooked vegetables. Mashed or boiled potatoes. Fruits  Bananas. Applesauce. Other types of cooked or canned fruit with the skin and seeds removed, such as canned peaches or pears. Meats and other proteins  Scrambled eggs. Creamy peanut butter or other nut butters. Lean, well-cooked meats, such as chicken or fish. Tofu. Soups or broths. Dairy Low-fat dairy products, such as milk, cottage cheese, or yogurt. Beverages  Water. Herbal tea. Apple juice. Fats and oils Mild salad dressings. Canola or olive oil. Sweets and desserts Pudding. Custard. Fruit gelatin. Ice cream. The items listed above may not be a complete list of recommended foods and beverages. Contact a dietitian for more options. What foods are not  recommended? Grains Whole grain breads and cereals. Vegetables Raw vegetables. Fruits Raw fruits, especially citrus, berries, or dried fruits. Dairy Whole fat dairy foods. Beverages Caffeinated drinks. Alcohol. Seasonings and condiments Strongly flavored seasonings or condiments. Hot sauce. Salsa. Other foods Spicy foods. Fried foods. Sour foods, such as pickled or fermented foods. Foods with high sugar content. Foods high in fiber. The items listed above may not be a complete list of foods and beverages to avoid. Contact a dietitian for more information. Summary  A bland diet consists of foods that are often soft and do not have a lot of fat, fiber, or extra seasonings.  Foods without fat, fiber, or seasoning are easier for the body to digest.  Check with your health care provider to see how long you should follow this diet plan. It is not meant to be followed for long periods. This information is not intended to replace advice given to you by your health care provider. Make sure you discuss any questions you have with your health care provider. Document Revised: 06/11/2017 Document Reviewed: 06/11/2017 Elsevier Patient Education  2020 Elsevier Inc.  

## 2020-05-31 NOTE — Progress Notes (Signed)
Subjective:    Patient ID: Cassandra Pham, female    DOB: Dec 31, 1975, 45 y.o.   MRN: [id number]  HPI   45 yo female in non acute distress, consents to telemedicine appointment. F/u  Telemedicine appointment for Covid-19 and left ear pain.. Patient using Albuterol inhaler, wheezing resolved. taking Z-Pak and prednisone. Cough has improved but still productive, yellow/brown.  Last night 101 fever 102 2 days ago.. Still cannot hear out of left ear, with some discomfort.  Head congestion improved.resent chest xray within normal limits.  Review of Systems  Constitutional: Positive for fever. Negative for chills and fatigue.  HENT: Positive for congestion and ear pain (little bit). Negative for postnasal drip, rhinorrhea, sinus pressure, sinus pain, sneezing and sore throat.   Respiratory: Positive for cough, shortness of breath and wheezing. Negative for chest tightness.   Cardiovascular: Negative for chest pain.  Gastrointestinal: Positive for diarrhea (last night) and nausea. Negative for abdominal pain.  Genitourinary: Negative for difficulty urinating.  Musculoskeletal: Negative for myalgias.  Skin: Negative for color change and rash.  Neurological: Positive for headaches (on/off).       Objective:   Physical Exam AXOX3 No physical exam due to telemedicine appointment. No cough noted on call. Recent Results (from the past 2160 hour(s))  B12 and Folate Panel     Status: None   Collection Time: 03/09/20  3:15 PM  Result Value Ref Range   Vitamin B-12 413 232 - 1,245 pg/mL   Folate 9.3 >3.0 ng/mL    Comment: A serum folate concentration of less than 3.1 ng/mL is considered to represent clinical deficiency.   TSH + free T4     Status: None   Collection Time: 03/09/20  3:15 PM  Result Value Ref Range   TSH 1.450 0.450 - 4.500 uIU/mL   Free T4 1.19 0.82 - 1.77 ng/dL  POC FIEPP-29     Status: Normal   Collection Time: 05/04/20  2:56 PM  Result Value Ref Range   SARS  Coronavirus 2 Ag Negative Negative    Comment: Patient presented with c/o sore thorat and mucus. Symptoms started on 12/8. Patient aware of negative result. Patient had in person follow up visit with S.Parker   POCT rapid strep A     Status: Normal   Collection Time: 05/04/20  3:04 PM  Result Value Ref Range   Rapid Strep A Screen Negative Negative    Comment: Patient presented with c/o sore throat and mucus. Strep test negative, patient aware of result. Patient seen in office by S.Parker  POC COVID-19     Status: Abnormal   Collection Time: 05/23/20  9:53 AM  Result Value Ref Range   SARS Coronavirus 2 Ag Positive (A) Negative    Comment: Patient vaccinated. Patient aware of positive result. Virtual follow up with S.Parker      Assessment & Plan:  Covid-19 positive Left ear discomfort and decreased hearing. She may see an Urgent care for her left ear recommended [facility]. She may return to work if all symptoms are improving and no fever x 24 hours without Motrin or Tylenol being taken on 06/05/2020.. Isolate, rest,  Practice hygiene habits ( mask  , 6 ft apart and washing or using hand sanitizer, monitor and treat fever with Motrin or Tylenol per package instructions.Stop Z-Pak and and start Levaquin as directed ,reviewed black box warning on Levaquin with patient. Continue and finish prednisone. Cough improving but still productive , head congestion improving. Continue Albuterol MDI  as needed per instructions. Recheck with patient in one week at  8:15am. If worsening she knows to call the office.   Information on Bland diet given to patient. Patient verbalizes understanding and has no questions at the end of our conversation.   Bland Diet A bland diet consists of foods that are often soft and do not have a lot of fat, fiber, or extra seasonings. Foods without fat, fiber, or seasoning are easier for the body to digest. They are also less likely to irritate your mouth, throat, stomach, and other  parts of your digestive system. A bland diet is sometimes called a BRAT diet. What is my plan? Your health care provider or food and nutrition specialist (dietitian) may recommend specific changes to your diet to prevent symptoms or to treat your symptoms. These changes may include:  Eating small meals often.  Cooking food until it is soft enough to chew easily.  Chewing your food well.  Drinking fluids slowly.  Not eating foods that are very spicy, sour, or fatty.  Not eating citrus fruits, such as oranges and grapefruit. What do I need to know about this diet?  Eat a variety of foods from the bland diet food list.  Do not follow a bland diet longer than needed.  Ask your health care provider whether you should take vitamins or supplements. What foods can I eat? Grains  Hot cereals, such as cream of wheat. Rice. Bread, crackers, or tortillas made from refined white flour. Vegetables Canned or cooked vegetables. Mashed or boiled potatoes. Fruits  Bananas. Applesauce. Other types of cooked or canned fruit with the skin and seeds removed, such as canned peaches or pears. Meats and other proteins  Scrambled eggs. Creamy peanut butter or other nut butters. Lean, well-cooked meats, such as chicken or fish. Tofu. Soups or broths. Dairy Low-fat dairy products, such as milk, cottage cheese, or yogurt. Beverages  Water. Herbal tea. Apple juice. Fats and oils Mild salad dressings. Canola or olive oil. Sweets and desserts Pudding. Custard. Fruit gelatin. Ice cream. The items listed above may not be a complete list of recommended foods and beverages. Contact a dietitian for more options. What foods are not recommended? Grains Whole grain breads and cereals. Vegetables Raw vegetables. Fruits Raw fruits, especially citrus, berries, or dried fruits. Dairy Whole fat dairy foods. Beverages Caffeinated drinks. Alcohol. Seasonings and condiments Strongly flavored seasonings or  condiments. Hot sauce. Salsa. Other foods Spicy foods. Fried foods. Sour foods, such as pickled or fermented foods. Foods with high sugar content. Foods high in fiber. The items listed above may not be a complete list of foods and beverages to avoid. Contact a dietitian for more information. Summary  A bland diet consists of foods that are often soft and do not have a lot of fat, fiber, or extra seasonings.  Foods without fat, fiber, or seasoning are easier for the body to digest.  Check with your health care provider to see how long you should follow this diet plan. It is not meant to be followed for long periods. This information is not intended to replace advice given to you by your health care provider. Make sure you discuss any questions you have with your health care provider. Document Revised: 06/11/2017 Document Reviewed: 06/11/2017 Elsevier Patient Education  2020 [employer].

## 2020-06-07 DIAGNOSIS — Z20822 Contact with and (suspected) exposure to covid-19: Secondary | ICD-10-CM | POA: Diagnosis not present

## 2020-06-21 ENCOUNTER — Encounter: Payer: Self-pay | Admitting: Medical

## 2020-06-21 ENCOUNTER — Other Ambulatory Visit: Payer: Self-pay

## 2020-06-21 ENCOUNTER — Ambulatory Visit: Payer: BC Managed Care – PPO | Admitting: Medical

## 2020-06-21 VITALS — BP 122/80 | HR 79 | Temp 97.6°F | Resp 16 | Wt 127.0 lb

## 2020-06-21 DIAGNOSIS — H6122 Impacted cerumen, left ear: Secondary | ICD-10-CM

## 2020-06-21 NOTE — Progress Notes (Signed)
   Subjective:    Patient ID: Cassandra Pham, female    DOB: 1975-08-26, 45 y.o.   MRN: [id number]  HPI 45 yo female in non acute distress. Blocked feeling and decreased hearing in Left ear. After treatment with antibiotics , she did get well and ears were feeling normal.  Blocked hearing began Prior history of cerumen impacation.     Review of Systems    negative ROS  feeling better since illness1/09/2020. Ears got better as well. Objective:   Physical Exam  Left ear  Blocked with cerumen Right ear small amount of wax.     Assessment & Plan:  Cerumen impaction  Ear irrigation  Use OTC Debrox otic drops  5-10 drops twice a day x 4 days, try to rinse in shower. If unable to clear return to clinic for ear irrigation. The do a maintenance of one x / week, one day. Return to clinic as needed. Patient verbalizes understanding and has no questions at discharge.

## 2020-06-21 NOTE — Patient Instructions (Signed)
Place cerumen impaction patient instructions here.

## 2020-08-14 DIAGNOSIS — Z20822 Contact with and (suspected) exposure to covid-19: Secondary | ICD-10-CM | POA: Diagnosis not present

## 2020-09-14 ENCOUNTER — Ambulatory Visit: Payer: BC Managed Care – PPO

## 2020-09-14 ENCOUNTER — Telehealth: Payer: BC Managed Care – PPO | Admitting: Medical

## 2020-09-14 ENCOUNTER — Other Ambulatory Visit: Payer: Self-pay

## 2020-09-14 DIAGNOSIS — Z20822 Contact with and (suspected) exposure to covid-19: Secondary | ICD-10-CM

## 2020-09-14 DIAGNOSIS — R058 Other specified cough: Secondary | ICD-10-CM

## 2020-09-14 DIAGNOSIS — Z1152 Encounter for screening for COVID-19: Secondary | ICD-10-CM

## 2020-09-14 DIAGNOSIS — U071 COVID-19: Secondary | ICD-10-CM

## 2020-09-14 DIAGNOSIS — R0981 Nasal congestion: Secondary | ICD-10-CM

## 2020-09-14 LAB — POC COVID19 BINAXNOW: SARS Coronavirus 2 Ag: POSITIVE — AB

## 2020-09-14 MED ORDER — BENZONATATE 100 MG PO CAPS: ORAL_CAPSULE | ORAL | 0 refills | Status: DC

## 2020-09-14 NOTE — Patient Instructions (Signed)

## 2020-09-14 NOTE — Progress Notes (Signed)
Subjective:    Patient ID: Cassandra Pham, female    DOB: Apr 08, 1976, 45 y.o.   MRN: [id number]  HPI 45 yo female in non acute distress consents to telemedicine appointment. Dr;y cough 4 days. Nasal congestion on right side of nose. No shortness of breath, no chest pain, " I have no other symptoms".  Tested Covid-19 POC positive at the [facility] on the 09/05/20 with a negative PCR, retested on 09/12/20 positive POC test, pending PCR. Tests weekly for Covid-19 due to not  Being boosted.   Patient at [facility] getting more Ibuprofen I had difficulty hearing patient and so she will call me back once she gets to her vehicle.   Vaccinated but not boosted and therefore needs weekly testing per [facility]  Review of Systems  Constitutional: Positive for fever. Negative for fatigue.  HENT: Positive for congestion and rhinorrhea. Negative for ear pain, postnasal drip, sinus pressure, sinus pain, sneezing and sore throat.   Respiratory: Positive for cough. Negative for shortness of breath and wheezing.   Cardiovascular: Negative for chest pain.  Gastrointestinal: Negative for abdominal pain and diarrhea.  Genitourinary: Negative for difficulty urinating.  Musculoskeletal: Positive for myalgias.  Skin: Negative for color change and rash.  Allergic/Immunologic: Negative for environmental allergies.  Neurological: Negative for dizziness, syncope, light-headedness and headaches.      Objective:   Physical Exam  AXOX3 No physical exam is performed due to telemedicine appointment. No cough noted on call.     Orders Placed This Encounter  Procedures  . Novel Coronavirus, NAA ([facility])    Order Specific Question:   Is this test for diagnosis or screening    Answer:   Diagnosis of ill patient    Order Specific Question:   Symptomatic for COVID-19 as defined by CDC    Answer:   Yes    Order Specific Question:   Date of Symptom Onset    Answer:   09/10/2020    Order Specific Question:    Hospitalized for COVID-19    Answer:   No    Order Specific Question:   Admitted to ICU for COVID-19    Answer:   No    Order Specific Question:   Previously tested for COVID-19    Answer:   Yes    Order Specific Question:   Resident in a congregate (group) care setting    Answer:   No    Order Specific Question:   Is the patient student?    Answer:   No    Order Specific Question:   Employed in healthcare setting    Answer:   No    Order Specific Question:   Pregnant    Answer:   No    Order Specific Question:   Has patient completed COVID vaccination(s) (2 doses of Pfizer/Moderna 1 dose of [employer])    Answer:   Yes    Order Specific Question:   Has patient completed COVID Booster / 3rd dose    Answer:   No    Order Specific Question:   Release to patient    Answer:   Immediate   Assessment & Plan:  Covid-19 screening Covid-19 positive  Infection on Point of care test (POC) Cough, nasal congestion.Covid-19 PCR pending . Meds ordered this encounter  Medications  . benzonatate (TESSALON PERLES) 100 MG capsule    Sig: Take 1-2 capsules every 8 hours as needed for cough, swallow whole.    Dispense:  30  capsule    Refill:  0  Isolate, rest, increase fluids, treat fever with OTC Motrin or Tylenol per package instructions. I will contact patient when I receive her PCR results. Patient verbalizes understanding an has no questions at the end of our conversation.

## 2020-09-16 LAB — NOVEL CORONAVIRUS, NAA

## 2020-09-19 ENCOUNTER — Telehealth: Payer: Self-pay | Admitting: Medical

## 2020-09-19 NOTE — Telephone Encounter (Signed)
I spoke with Cassandra Pham on 07/21/20 about 3:45 pm. She is feeling much better and would like to return to work on 09/19/20. She says all her symptoms have improved and no fever x 24 hours with no OTC Motrin or Tylenol being taken.   Her POc Covid-19 test was positive so we sent a PCR test for Covid-19 to [facility]. The sample resulted as indeterminate.  I do believe she was positive for Covid-19 due to her symptoms. She should not test for  Covid-19 x  90 days. If she has any concerns to call the clinic. She is to call HR for guidance on not testing x 90 days. She verbalizes understanding and has no questions at the end of our conversation.

## 2020-11-16 ENCOUNTER — Other Ambulatory Visit: Payer: Self-pay

## 2020-11-16 ENCOUNTER — Ambulatory Visit: Payer: BC Managed Care – PPO | Admitting: Medical

## 2020-11-16 ENCOUNTER — Encounter: Payer: Self-pay | Admitting: Medical

## 2020-11-16 VITALS — BP 108/68 | HR 104 | Temp 98.0°F | Resp 16 | Ht 60.0 in | Wt 126.0 lb

## 2020-11-16 DIAGNOSIS — R519 Headache, unspecified: Secondary | ICD-10-CM

## 2020-11-16 MED ORDER — IBUPROFEN 800 MG PO TABS: 800.0000 mg | ORAL_TABLET | Freq: Three times a day (TID) | ORAL | 0 refills | Status: DC | PRN

## 2020-11-16 NOTE — Patient Instructions (Signed)
General Headache Without Cause A headache is pain or discomfort that is felt around the head or neck area. There are many causes and types of headaches. In some cases, the cause may notbe found. Follow these instructions at home: Watch your condition for any changes. Let your doctor know about them. Takethese steps to help with your condition: Managing pain     Take over-the-counter and prescription medicines only as told by your doctor. Lie down in a dark, quiet room when you have a headache. If told, put ice on your head and neck area: Put ice in a plastic bag. Place a towel between your skin and the bag. Leave the ice on for 20 minutes, 2-3 times per day. If told, put heat on the affected area. Use the heat source that your doctor recommends, such as a moist heat pack or a heating pad. Place a towel between your skin and the heat source. Leave the heat on for 20-30 minutes. Remove the heat if your skin turns bright red. This is very important if you are unable to feel pain, heat, or cold. You may have a greater risk of getting burned. Keep lights dim if bright lights bother you or make your headaches worse. Eating and drinking Eat meals on a regular schedule. If you drink alcohol: Limit how much you use to: 0-1 drink a day for women. 0-2 drinks a day for men. Be aware of how much alcohol is in your drink. In the [place] one drink equals one 12 oz bottle of beer (355 mL), one 5 oz glass of wine (148 mL), or one 1 oz glass of hard liquor (44 mL). Stop drinking caffeine, or reduce how much caffeine you drink. General instructions  Keep a journal to find out if certain things bring on headaches. For example, write down: What you eat and drink. How much sleep you get. Any change to your diet or medicines. Get a massage or try other ways to relax. Limit stress. Sit up straight. Do not tighten (tense) your muscles. Do not use any products that contain nicotine or tobacco. This includes  cigarettes, e-cigarettes, and chewing tobacco. If you need help quitting, ask your doctor. Exercise regularly as told by your doctor. Get enough sleep. This often means 7-9 hours of sleep each night. Keep all follow-up visits as told by your doctor. This is important.  Contact a doctor if: Your symptoms are not helped by medicine. You have a headache that feels different than the other headaches. You feel sick to your stomach (nauseous) or you throw up (vomit). You have a fever. Get help right away if: Your headache gets very bad quickly. Your headache gets worse after a lot of physical activity. You keep throwing up. You have a stiff neck. You have trouble seeing. You have trouble speaking. You have pain in the eye or ear. Your muscles are weak or you lose muscle control. You lose your balance or have trouble walking. You feel like you will pass out (faint) or you pass out. You are mixed up (confused). You have a seizure. Summary A headache is pain or discomfort that is felt around the head or neck area. There are many causes and types of headaches. In some cases, the cause may not be found. Keep a journal to help find out what causes your headaches. Watch your condition for any changes. Let your doctor know about them. Contact a doctor if you have a headache that is different from usual,  or if your headache is not helped by medicine. Get help right away if your headache gets very bad, you throw up, you have trouble seeing, you lose your balance, or you have a seizure. This information is not intended to replace advice given to you by your health care provider. Make sure you discuss any questions you have with your healthcare provider. Document Revised: 12/01/2017 Document Reviewed: 12/01/2017 Else[employer] Patient Education  [employer]. Form - Headache Record There are many types and causes of headaches. A headache record can help guide your treatment plan. Use this form to  record the details. Bring this form with you to your follow-up visits. Follow your health care provider's instructions on how to describe your headache. You may be asked to: Use a pain scale. This is a tool to rate the intensity of your headache using words or numbers. Describe what your headache feels like, such as dull, achy, throbbing, or sharp. Headache record Date: _______________ Time (from start to end): ____________________ Location of the headache: _________________________ Intensity of the headache: ____________________ Description of the headache: ______________________________________________________________ Hours of sleep the night before the headache: __________ Food or drinks before the headache started: ______________________________________________________________________________________ Events before the headache started: _______________________________________________________________________________________________ Symptoms before the headache started: __________________________________________________________________________________________ Symptoms during the headache: __________________________________________________________________________________________________ Treatment: ________________________________________________________________________________________________________________ Effect of treatment: _________________________________________________________________________________________________________ Other comments: ___________________________________________________________________________________________________________ Date: _______________ Time (from start to end): ____________________ Location of the headache: _________________________ Intensity of the headache: ____________________ Description of the headache: ______________________________________________________________ Hours of sleep the night before the headache: __________ Food or drinks before the  headache started: ______________________________________________________________________________________ Events before the headache started: ____________________________________________________________________________________________ Symptoms before the headache started: _________________________________________________________________________________________ Symptoms during the headache: _______________________________________________________________________________________________ Treatment: ________________________________________________________________________________________________________________ Effect of treatment: _________________________________________________________________________________________________________ Other comments: ___________________________________________________________________________________________________________ Date: _______________ Time (from start to end): ____________________ Location of the headache: _________________________ Intensity of the headache: ____________________ Description of the headache: ______________________________________________________________ Hours of sleep the night before the headache: __________ Food or drinks before the headache started: ______________________________________________________________________________________ Events before the headache started: ____________________________________________________________________________________________ Symptoms before the headache started: _________________________________________________________________________________________ Symptoms during the headache: _______________________________________________________________________________________________ Treatment: ________________________________________________________________________________________________________________ Effect of treatment:  _________________________________________________________________________________________________________ Other comments: ___________________________________________________________________________________________________________ Date: _______________ Time (from start to end): ____________________ Location of the headache: _________________________ Intensity of the headache: ____________________ Description of the headache: ______________________________________________________________ Hours of sleep the night before the headache: _________ Food or drinks before the headache started: ______________________________________________________________________________________ Events before the headache started: ____________________________________________________________________________________________ Symptoms before the headache started: _________________________________________________________________________________________ Symptoms during the headache: _______________________________________________________________________________________________ Treatment: ________________________________________________________________________________________________________________ Effect of treatment: _________________________________________________________________________________________________________ Other comments: ___________________________________________________________________________________________________________ Date: _______________ Time (from start to end): ____________________ Location of the headache: _________________________ Intensity of the headache: ____________________ Description of the headache: ______________________________________________________________ Hours of sleep the night before the headache: _________ Food or drinks before the headache started: ______________________________________________________________________________________ Events before the headache started:  ____________________________________________________________________________________________ Symptoms before the headache started: _________________________________________________________________________________________ Symptoms during the headache: _______________________________________________________________________________________________ Treatment: ________________________________________________________________________________________________________________ Effect of treatment: _________________________________________________________________________________________________________ Other comments: ___________________________________________________________________________________________________________ This information is not intended to replace advice given to you by your health care provider. Make sure you discuss any questions you have with your healthcare provider. Document Revised: 06/01/2018 Document Reviewed: 06/01/2018 Elsevier Patient Education  [employer].

## 2020-11-16 NOTE — Progress Notes (Signed)
Subjective:    Patient ID: Cassandra Pham, female    DOB: 06-May-1976, 45 y.o.   MRN: [id number]  HPI 45 yo female in non acute distress. Yesterday started as a sharp pain all day long took  2 500 tylenol  Took  Motrin 800mg   before bed and that resolved it.  Woke up with it. On/ Off pain every 5 secs or  10 secs. Still sharp. Not the worse HA in her life.  No prior symptoms of this kind. HA locaded on the right hand side occipital area.   Migraine history eyes spasms comes right on , needs complete darkness , takes Excedrin Migraine and it resolved it.  No injury  Blood pressure 108/68, pulse (!) 104, temperature 98 F (36.7 C), temperature source Tympanic, resp. rate 16, height 5' (1.524 m), weight 126 lb (57.2 kg), SpO2 98 %.   Allergies  Allergen Reactions   Ginger Hives   Amoxil [Amoxicillin] Hives    Review of Systems  Constitutional:  Negative for chills, fatigue and fever.  HENT:  Negative for congestion, ear discharge, ear pain, hearing loss, postnasal drip, rhinorrhea, sinus pressure, sinus pain, sneezing and sore throat.   Eyes:  Negative for photophobia, pain and visual disturbance.  Respiratory:  Negative for cough and shortness of breath.   Cardiovascular:  Negative for chest pain.  Gastrointestinal:  Negative for abdominal pain and diarrhea.  Musculoskeletal:  Negative for neck pain and neck stiffness.  Allergic/Immunologic: Negative for environmental allergies.  Neurological:  Positive for headaches. Negative for tremors, seizures, syncope, speech difficulty, weakness and numbness.  Psychiatric/Behavioral:  Negative for confusion.       Objective:   Physical Exam Vitals and nursing note reviewed.  Constitutional:      Appearance: She is well-developed and normal weight.  HENT:     Head: Normocephalic and atraumatic.     Mouth/Throat:     Mouth: Mucous membranes are moist.     Pharynx: Oropharynx is clear.  Eyes:     General: No visual field deficit.     Extraocular Movements: Extraocular movements intact.     Pupils: Pupils are equal, round, and reactive to light.  Cardiovascular:     Rate and Rhythm: Normal rate and regular rhythm.     Heart sounds: Normal heart sounds.  Pulmonary:     Effort: Pulmonary effort is normal.     Breath sounds: Normal breath sounds.  Musculoskeletal:     Cervical back: Normal range of motion and neck supple.  Skin:    General: Skin is warm and dry.  Neurological:     Mental Status: She is alert and oriented to person, place, and time. Mental status is at baseline.     GCS: GCS eye subscore is 4. GCS verbal subscore is 5. GCS motor subscore is 6.     Cranial Nerves: No cranial nerve deficit, dysarthria or facial asymmetry.     Sensory: No sensory deficit.     Motor: No weakness.     Coordination: Romberg sign negative. Coordination normal.     Gait: Gait normal.     Deep Tendon Reflexes: Reflexes normal. Babinski sign absent on the right side. Babinski sign absent on the left side.  Psychiatric:        Mood and Affect: Mood normal.        Speech: Speech normal.        Behavior: Behavior normal.          Assessment &  Plan:  Headache Current Outpatient Medications on File Prior to Visit  Medication Sig Dispense Refill   acetaminophen (TYLENOL) 500 MG tablet Take 500 mg by mouth every 6 (six) hours as needed.     No current facility-administered medications on file prior to visit.   Meds ordered this encounter  Medications   DISCONTD: ibuprofen (ADVIL) 800 MG tablet    Sig: Take 1 tablet (800 mg total) by mouth every 8 (eight) hours as needed.    Dispense:  30 tablet    Refill:  0   Note for work. Return to this clinic as needed. Patient verbalizes understanding and has no questions at discharge.

## 2020-12-07 ENCOUNTER — Other Ambulatory Visit: Payer: Self-pay | Admitting: Obstetrics and Gynecology

## 2020-12-07 DIAGNOSIS — Z1231 Encounter for screening mammogram for malignant neoplasm of breast: Secondary | ICD-10-CM

## 2021-01-04 ENCOUNTER — Ambulatory Visit
Admission: RE | Admit: 2021-01-04 | Discharge: 2021-01-04 | Disposition: A | Discharge status: Home / Self Care | Payer: BC Managed Care – PPO | Source: Ambulatory Visit | Attending: Obstetrics and Gynecology | Admitting: Obstetrics and Gynecology

## 2021-01-04 ENCOUNTER — Other Ambulatory Visit: Payer: Self-pay

## 2021-01-04 DIAGNOSIS — Z1231 Encounter for screening mammogram for malignant neoplasm of breast: Secondary | ICD-10-CM | POA: Diagnosis not present

## 2021-01-05 ENCOUNTER — Ambulatory Visit: Payer: BC Managed Care – PPO | Admitting: Medical

## 2021-01-05 DIAGNOSIS — Z20822 Contact with and (suspected) exposure to covid-19: Secondary | ICD-10-CM

## 2021-01-05 DIAGNOSIS — Z1152 Encounter for screening for COVID-19: Secondary | ICD-10-CM

## 2021-01-05 LAB — POC COVID19 BINAXNOW: SARS Coronavirus 2 Ag: NEGATIVE

## 2021-01-05 NOTE — Patient Instructions (Signed)
Pharyngitis  Pharyngitis is a sore throat (pharynx). This is when there is redness, pain, and swelling in your throat. Most of the time, this condition gets better on its own. In some cases, you may needmedicine. Follow these instructions at home: Take over-the-counter and prescription medicines only as told by your doctor. If you were prescribed an antibiotic medicine, take it as told by your doctor. Do not stop taking the antibiotic even if you start to feel better. Do not give children aspirin. Aspirin has been linked to Reye syndrome. Drink enough water and fluids to keep your pee (urine) clear or pale yellow. Get a lot of rest. Rinse your mouth (gargle) with a salt-water mixture 3-4 times a day or as needed. To make a salt-water mixture, completely dissolve -1 tsp of salt in 1 cup of warm water. Do not swallow this mixture. If your doctor approves, you may use throat lozenges or sprays to soothe your throat. Contact a doctor if: You have large, tender lumps in your neck. You have a rash. You cough up green, yellow-brown, or bloody spit. Get help right away if: You have a stiff neck. You drool or cannot swallow liquids. You cannot drink or take medicines without throwing up. You have very bad pain that does not go away with medicine. You have problems breathing, and it is not from a stuffy nose. You have new pain and swelling in your knees, ankles, wrists, or elbows. Summary Pharyngitis is a sore throat (pharynx). This is when there is redness, pain, and swelling in your throat. If you were prescribed an antibiotic medicine, take it as told by your doctor. Do not stop taking the antibiotic even if you start to feel better. Most of the time, pharyngitis gets better on its own. Sometimes, you may need medicine. This information is not intended to replace advice given to you by your health care provider. Make sure you discuss any questions you have with your healthcare  provider. Document Revised: 04/13/2020 Document Reviewed: 04/14/2020 Elsevier Patient Education  2022 [place]: What to Do if You Are Sick CDC has updated isolation and quarantine recommendations for the public, and is revising the CDC website to reflect these changes. These recommendations do not apply to healthcare personnel and do not supersede state, local, tribal, or territorial laws, rules, andregulations. If you have a fever, cough or other symptoms, you might have COVID-19. Most people have mild illness and are able to recover at home. If you are sick: Keep track of your symptoms. If you have an emergency warning sign (including trouble breathing), call 911. Steps to help prevent the spread of COVID-19 if you are sick If you are sick with COVID-19 or think you might have COVID-19, follow the steps below to care for yourself and to help protect other peoplein your home and community. Stay home except to get medical care Stay home. Most people with COVID-19 have mild illness and can recover at home without medical care. Do not leave your home, except to get medical care. Do not visit public areas. Take care of yourself. Get rest and stay hydrated. Take over-the-counter medicines, such as acetaminophen, to help you feel better. Stay in touch with your doctor. Call before you get medical care. Be sure to get care if you have trouble breathing, or have any other emergency warning signs, or if you think it is an emergency. Avoid public transportation, ride-sharing, or taxis. Separate yourself from other people As much as possible, stay  in a specific room and away from other people and pets in your home. If possible, you should use a separate bathroom. If you need to be around other people or animals in oroutside of the home, wear a mask. Tell your close contactsthat they may have been exposed to COVID-19. An infected person can spread COVID-19 starting 48 hours (or 2 days) before  the person has any symptoms or tests positive. By letting your close contacts know they may have been exposed to COVID-19, you are helping to protect everyone. Additional guidance is available for those living in close quarters and shared housing. See COVID-19 and Animals if you have questions about pets. If you are diagnosed with COVID-19, someone from the health department may call you. Answer the call to slow the spread. Monitor your symptoms Symptoms of COVID-19 include fever, cough, or other symptoms. Follow care instructions from your healthcare provider and local health department. Your local health authorities may give instructions on checking your symptoms and reporting information. When to seek emergency medical attention Look for emergency warning signs* for COVID-19. If someone is showing any of these signs, seek emergency medical care immediately: Trouble breathing Persistent pain or pressure in the chest New confusion Inability to wake or stay awake Pale, gray, or blue-colored skin, lips, or nail beds, depending on skin tone *This list is not all possible symptoms. Please call your medical provider forany other symptoms that are severe or concerning to you. Call 911 or call ahead to your local emergency facility: Notify the operator that you are seeking care for someone who has or may haveCOVID-19. Call ahead before visiting your doctor Call ahead. Many medical visits for routine care are being postponed or done by phone or telemedicine. If you have a medical appointment that cannot be postponed, call your doctor's office, and tell them you have or may have COVID-19. This will help the office protect themselves and other patients. Get tested If you have symptoms of COVID-19, get tested. While waiting for test results, you stay away from others, including staying apart from those living in your household. Self-tests are one of several options for testing for the virus that causes  COVID-19 and may be more convenient than laboratory-based tests and point-of-care tests. Ask your healthcare provider or your local health department if you need help interpreting your test results. You can visit your state, tribal, local, and territorial health department's website to look for the latest local information on testing sites. If you are sick, wear a mask over your nose and mouth You should wear a mask over your nose and mouth if you must be around other people or animals, including pets (even at home). You don't need to wear the mask if you are alone. If you can't put on a mask (because of trouble breathing, for example), cover your coughs and sneezes in some other way. Try to stay at least 6 feet away from other people. This will help protect the people around you. Masks should not be placed on young children under age 72 years, anyone who has trouble breathing, or anyone who is not able to remove the mask without help. Note: During the COVID-19 pandemic, medical grade facemasks are reserved forhealthcare workers and some first responders. Cover your coughs and sneezes Cover your mouth and nose with a tissue when you cough or sneeze. Throw away used tissues in a lined trash can. Immediately wash your hands with soap and water for at least 20 seconds. If  soap and water are not available, clean your hands with an alcohol-based hand sanitizer that contains at least 60% alcohol. Clean your hands often Wash your hands often with soap and water for at least 20 seconds. This is especially important after blowing your nose, coughing, or sneezing; going to the bathroom; and before eating or preparing food. Use hand sanitizer if soap and water are not available. Use an alcohol-based hand sanitizer with at least 60% alcohol, covering all surfaces of your hands and rubbing them together until they feel dry. Soap and water are the best option, especially if hands are visibly dirty. Avoid touching  your eyes, nose, and mouth with unwashed hands. Handwashing Tips Avoid sharing personal household items Do not share dishes, drinking glasses, cups, eating utensils, towels, or bedding with other people in your home. Wash these items thoroughly after using them with soap and water or put in the dishwasher. Clean all "high-touch" surfaces every day Clean and disinfect high-touch surfaces in your "sick room" and bathroom; wear disposable gloves. Let someone else clean and disinfect surfaces in common areas, but you should clean your bedroom and bathroom, if possible. If a caregiver or other person needs to clean and disinfect a sick person's bedroom or bathroom, they should do so on an as-needed basis. The caregiver/other person should wear a mask and disposable gloves prior to cleaning. They should wait as long as possible after the person who is sick has used the bathroom before coming in to clean and use the bathroom. High-touch surfaces include phones, remote controls, counters, tabletops, doorknobs, bathroom fixtures, toilets, keyboards, tablets, and bedside tables. Clean and disinfect areas that may have blood, stool, or body fluids on them. Use household cleaners and disinfectants. Clean the area or item with soap and water or another detergent if it is dirty. Then, use a household disinfectant. Be sure to follow the instructions on the label to ensure safe and effective use of the product. Many products recommend keeping the surface wet for several minutes to ensure germs are killed. Many also recommend precautions such as wearing gloves and making sure you have good ventilation during use of the product. Use a product from [employer]: Disinfectants for Coronavirus ([id number]). Complete Disinfection Guidance When you can be around others after being sick with COVID-19 Deciding when you can be around others is different for different situations. Find out when you can safely end home  isolation. For any additional questions about your care,contact your healthcare provider or state or local health department. 05/03/2020 Content source: [facility] ([facility]), Division of Viral Diseases This information is not intended to replace advice given to you by your health care provider. Make sure you discuss any questions you have with your healthcare provider. Document Revised: 06/30/2020 Document Reviewed: 06/30/2020 Elsevier Patient Education  [place].

## 2021-01-05 NOTE — Progress Notes (Signed)
   Subjective:    Patient ID: Cassandra Pham, female    DOB: 10/15/1975, 45 y.o.   MRN: [id number]  HPI Yesterday with ST , HA and bodyaches close   Sat 98%  Pfizer vaccinated no booster   Review of Systems  Constitutional:  Negative for chills, fatigue and fever.  HENT:  Positive for congestion, rhinorrhea, sneezing, sore throat and voice change. Negative for postnasal drip, sinus pressure and sinus pain.   Respiratory:  Positive for cough and shortness of breath (on exertion).   Cardiovascular:  Negative for chest pain.  Gastrointestinal:  Negative for abdominal pain and diarrhea.  Musculoskeletal:  Positive for myalgias.  Allergic/Immunologic: Negative for environmental allergies.  Neurological:  Positive for headaches. Negative for dizziness.      Objective:   Physical Exam        Assessment & Plan:    Isolate , rest increase fluids OTC Motrin or Tylenol for fever or pain.

## 2021-01-06 LAB — SARS-COV-2, NAA 2 DAY TAT

## 2021-01-06 LAB — NOVEL CORONAVIRUS, NAA: SARS-CoV-2, NAA: NOT DETECTED

## 2021-01-08 ENCOUNTER — Telehealth: Payer: BC Managed Care – PPO | Admitting: Medical

## 2021-01-08 ENCOUNTER — Other Ambulatory Visit: Payer: Self-pay

## 2021-01-08 DIAGNOSIS — J029 Acute pharyngitis, unspecified: Secondary | ICD-10-CM

## 2021-01-08 DIAGNOSIS — R0982 Postnasal drip: Secondary | ICD-10-CM

## 2021-01-08 NOTE — Patient Instructions (Signed)
10 Things You Can Do to Manage Your COVID-19 Symptoms at Home If you have possible or confirmed COVID-19 Stay home except to get medical care. Monitor your symptoms carefully. If your symptoms get worse, call your healthcare provider immediately. Get rest and stay hydrated. If you have a medical appointment, call the healthcare provider ahead of time and tell them that you have or may have COVID-19. For medical emergencies, call 911 and notify the dispatch personnel that you have or may have COVID-19. Cover your cough and sneezes with a tissue or use the inside of your elbow. Wash your hands often with soap and water for at least 20 seconds or clean your hands with an alcohol-based hand sanitizer that contains at least 60% alcohol. As much as possible, stay in a specific room and away from other people in your home. Also, you should use a separate bathroom, if available. If you need to be around other people in or outside of the home, wear a mask. Avoid sharing personal items with other people in your household, like dishes, towels, and bedding. Clean all surfaces that are touched often, like counters, tabletops, and doorknobs. Use household cleaning sprays or wipes according to the label instructions. cdc.gov/coronavirus 12/10/2019 This information is not intended to replace advice given to you by your health care provider. Make sure you discuss any questions you have with your healthcare provider. Document Revised: 06/30/2020 Document Reviewed: 06/30/2020 Elsevier Patient Education  2022 Elsevier Inc.  

## 2021-01-08 NOTE — Progress Notes (Signed)
Called patient, she is feeling much better and is planning on returning to work  on 01/10/2021.  All her tests POCT and PCR were negative.  Her symptoms have all improved and she is monitoring her temperature today with out OTC Motrin or Tylenol.  She still states she has a sore throat.  I recommended she take  OTC Zyrtec to help with Post Nasal Drip. She has some at home and will try that for  7-14 days per my recommendation.  She is to wear a  mask x 5 days after her return to work.  Work notes sent to HR and to patient email per patient request she knows that is not a secure emal.

## 2021-02-16 ENCOUNTER — Other Ambulatory Visit: Payer: Self-pay

## 2021-02-16 ENCOUNTER — Encounter: Payer: Self-pay | Admitting: Nurse Practitioner

## 2021-02-16 ENCOUNTER — Ambulatory Visit: Payer: BC Managed Care – PPO | Admitting: Nurse Practitioner

## 2021-02-16 VITALS — BP 115/72 | HR 76 | Temp 98.2°F | Resp 16 | Ht 60.0 in | Wt 130.0 lb

## 2021-02-16 DIAGNOSIS — N3001 Acute cystitis with hematuria: Secondary | ICD-10-CM

## 2021-02-16 LAB — POCT URINALYSIS DIPSTICK OB
Bilirubin, UA: NEGATIVE
Glucose, UA: NEGATIVE
Ketones, UA: NEGATIVE
Leukocytes, UA: NEGATIVE
Nitrite, UA: NEGATIVE
POC,PROTEIN,UA: NEGATIVE
Spec Grav, UA: 1.01 (ref 1.010–1.025)
Urobilinogen, UA: 0.2 E.U./dL
pH, UA: 5 (ref 5.0–8.0)

## 2021-02-16 MED ORDER — NITROFURANTOIN MONOHYD MACRO 100 MG PO CAPS: 100.0000 mg | ORAL_CAPSULE | Freq: Two times a day (BID) | ORAL | 0 refills | Status: AC

## 2021-02-16 NOTE — Progress Notes (Signed)
Subjective:    Patient ID: Cassandra Pham, female    DOB: 20-Mar-1976, 45 y.o.   MRN: [id number]  HPI  45 year old female presenting to [facility] with complaints of urinary urgency and frequency for the past   She has had UTIs in the past, most recent was one year ago. No history of antibiotic resistance.    Allergies  Allergen Reactions   Ginger Hives   Amoxil [Amoxicillin] Hives    Today's Vitals   02/16/21 1332  BP: 115/72  Pulse: 76  Resp: 16  Temp: 98.2 F (36.8 C)  TempSrc: Oral  SpO2: 100%  Weight: 130 lb (59 kg)  Height: 5' (1.524 m)   Body mass index is 25.39 kg/m.   Review of Systems  Constitutional: Negative.   HENT: Negative.    Respiratory: Negative.    Cardiovascular: Negative.   Gastrointestinal: Negative.   Genitourinary:  Positive for dysuria, frequency and urgency.  Musculoskeletal: Negative.   Neurological: Negative.   Hematological: Negative.   Psychiatric/Behavioral: Negative.        Objective:   Physical Exam Constitutional:      Appearance: Normal appearance.  HENT:     Head: Normocephalic.  Pulmonary:     Effort: Pulmonary effort is normal.  Abdominal:     Tenderness: There is no right CVA tenderness or left CVA tenderness.  Neurological:     General: No focal deficit present.     Mental Status: She is alert.  Psychiatric:        Mood and Affect: Mood normal.   Recent Results (from the past 2160 hour(s))  Novel Coronavirus, NAA ([facility])     Status: None   Collection Time: 01/05/21  1:05 PM   Specimen: Nasopharyngeal(NP) swabs in vial transport medium   Nasopharynge  Result Value Ref Range   SARS-CoV-2, NAA Not Detected Not Detected    Comment: This nucleic acid amplification test was developed and its performance characteristics determined by [employer]. Nucleic acid amplification tests include RT-PCR and TMA. This test has not been FDA cleared or approved. This test has been authorized by FDA  under an Emergency Use Authorization (EUA). This test is only authorized for the duration of time the declaration that circumstances exist justifying the authorization of the emergency use of in vitro diagnostic tests for detection of SARS-CoV-2 virus and/or diagnosis of COVID-19 infection under section 564(b)(1) of the Act, 21 U.S.C. 094BSJ-6(G) (1), unless the authorization is terminated or revoked sooner. When diagnostic testing is negative, the possibility of a false negative result should be considered in the context of a patient's recent exposures and the presence of clinical signs and symptoms consistent with COVID-19. An individual without symptoms of COVID-19 and who is not shedding SARS-CoV-2 virus wo uld expect to have a negative (not detected) result in this assay.   POC COVID-19     Status: Normal   Collection Time: 01/05/21  1:05 PM  Result Value Ref Range   SARS Coronavirus 2 Ag Negative Negative    Comment: Vac x 2 Pfizer, Booster x 0. Symptomatic. Aware of neg POC. Has further eval with H. Ratcliffe PAC  SARS-COV-2, NAA 2 DAY TAT     Status: None   Collection Time: 01/05/21  1:05 PM   Nasopharynge  Result Value Ref Range   SARS-CoV-2, NAA 2 DAY TAT Performed   POC Urinalysis Dipstick OB     Status: Abnormal   Collection Time: 02/16/21  1:47 PM  Result Value Ref Range   Color, UA amber    Clarity, UA clear    Glucose, UA Negative Negative   Bilirubin, UA Negative    Ketones, UA Negative    Spec Grav, UA 1.010 1.010 - 1.025   Blood, UA trace    pH, UA 5.0 5.0 - 8.0   POC,PROTEIN,UA Negative Negative, Trace, Small (1+), Moderate (2+), Large (3+), 4+   Urobilinogen, UA 0.2 0.2 or 1.0 E.U./dL   Nitrite, UA Negative    Leukocytes, UA Negative Negative   Appearance     Odor           Assessment & Plan:  1. Acute cystitis with hematuria  - POC Urinalysis Dipstick OB - Urine Culture - nitrofurantoin, macrocrystal-monohydrate, (MACROBID) 100 MG capsule; Take  1 capsule (100 mg total) by mouth 2 (two) times daily for 5 days.  Dispense: 10 capsule; Refill: 0

## 2021-02-21 LAB — URINE CULTURE: Organism ID, Bacteria: NO GROWTH

## 2021-04-11 ENCOUNTER — Ambulatory Visit: Payer: BC Managed Care – PPO | Admitting: Nurse Practitioner

## 2021-04-11 ENCOUNTER — Encounter: Payer: Self-pay | Admitting: Nurse Practitioner

## 2021-04-11 ENCOUNTER — Other Ambulatory Visit: Payer: Self-pay

## 2021-04-11 VITALS — BP 110/72 | HR 92 | Temp 97.5°F | Resp 16

## 2021-04-11 DIAGNOSIS — M545 Low back pain, unspecified: Secondary | ICD-10-CM

## 2021-04-11 MED ORDER — CYCLOBENZAPRINE HCL 10 MG PO TABS
10.0000 mg | ORAL_TABLET | Freq: Every evening | ORAL | 0 refills | Status: AC | PRN
Start: 2021-04-11 — End: 2021-04-21

## 2021-04-11 MED ORDER — IBUPROFEN 600 MG PO TABS: 600.0000 mg | ORAL_TABLET | Freq: Four times a day (QID) | ORAL | 0 refills | Status: DC | PRN

## 2021-04-12 NOTE — Progress Notes (Signed)
   Subjective:    Patient ID: Cassandra Pham, female    DOB: 14-Jan-1976, 45 y.o.   MRN: [id number]  HPI  45 year old female presenting to [facility] with complaints of lower back pain for the past 2 days. This started when she was moving boxes at home and felt a sudden "twinge" in her right lower back.   Denies any pain into her legs denies numbess or tingling.   Today's Vitals   04/11/21 1507  BP: 110/72  Pulse: 92  Resp: 16  Temp: (!) 97.5 F (36.4 C)  TempSrc: Tympanic  SpO2: 99%   There is no height or weight on file to calculate BMI.   Review of Systems  Constitutional: Negative.   HENT: Negative.    Respiratory: Negative.    Cardiovascular: Negative.   Musculoskeletal:  Positive for back pain.  Skin: Negative.   Neurological:  Positive for numbness. Negative for weakness.   Past Medical History:  Diagnosis Date   Anxiety and depression    Asthma    Bacterial vaginosis    BRCA negative 02/2020   MyRisk neg except NTHL1 VUS; IBIS=7.4%/riskscore=7.2%   Family history of ovarian cancer 02/2019   MyRisk testing declined   Family history of pancreatic cancer    Medical history non-contributory    Migraine    UTI (urinary tract infection)      Current Outpatient Medications  Medication Instructions   acetaminophen (TYLENOL) 500 mg, Oral, Every 6 hours PRN   cyclobenzaprine (FLEXERIL) 10 mg, Oral, At bedtime PRN   ibuprofen (ADVIL) 800 mg, Oral, Every 8 hours PRN   ibuprofen (ADVIL) 600 mg, Oral, Every 6 hours PRN, Take with food       Objective:   Physical Exam HENT:     Head: Normocephalic.  Pulmonary:     Effort: Pulmonary effort is normal.  Musculoskeletal:     Lumbar back: Tenderness present. No bony tenderness. Decreased range of motion. Negative right straight leg raise test and negative left straight leg raise test.       Back:     Comments: Pain originated to circled area and radiates along line.   Neurological:     General: No  focal deficit present.     Mental Status: She is alert.     Sensory: Sensation is intact.     Motor: Motor function is intact. No weakness.     Coordination: Coordination is intact.     Gait: Gait is intact.          Assessment & Plan:  1. Acute right-sided low back pain without sciatica  - cyclobenzaprine (FLEXERIL) 10 MG tablet; Take 1 tablet (10 mg total) by mouth at bedtime as needed for up to 10 days for muscle spasms.  Dispense: 30 tablet; Refill: 0 - ibuprofen (ADVIL) 600 MG tablet; Take 1 tablet (600 mg total) by mouth every 6 (six) hours as needed for moderate pain. Take with food  Dispense: 30 tablet; Refill: 0     May use topical Icy hot to area.  RTC or seek medical attention for any numbness or weakness to extremities Passive ROM Avoid heavy lifting

## 2021-04-23 ENCOUNTER — Other Ambulatory Visit: Payer: Self-pay

## 2021-04-23 ENCOUNTER — Ambulatory Visit: Payer: BC Managed Care – PPO | Admitting: Medical

## 2021-04-23 ENCOUNTER — Encounter: Payer: Self-pay | Admitting: Medical

## 2021-04-23 VITALS — BP 110/75 | HR 92 | Temp 100.2°F | Resp 16 | Ht 60.0 in | Wt 130.0 lb

## 2021-04-23 DIAGNOSIS — R6889 Other general symptoms and signs: Secondary | ICD-10-CM

## 2021-04-23 DIAGNOSIS — R509 Fever, unspecified: Secondary | ICD-10-CM

## 2021-04-23 LAB — POCT INFLUENZA A/B
Influenza A, POC: POSITIVE — AB
Influenza B, POC: NEGATIVE

## 2021-04-23 MED ORDER — OSELTAMIVIR PHOSPHATE 75 MG PO CAPS: 75.0000 mg | ORAL_CAPSULE | Freq: Two times a day (BID) | ORAL | 0 refills | Status: DC

## 2021-04-23 NOTE — Progress Notes (Signed)
   Subjective:    Patient ID: Cassandra Pham, female    DOB: 11-19-1975, 45 y.o.   MRN: [id number]  HPI   45 yo not feeling well,  was sharing bed with daughter and then daughter was pos  Flu A. Patient with Cough starting  on Saturday, Sunday with body aches. Temp of  102 at home . Took  ibuprofen 600 mg prior to coming to the clinic.  Blood pressure 110/75, pulse 92, temperature 100.2 F (37.9 C), temperature source Oral, resp. rate 16, height 5' (1.524 m), weight 130 lb (59 kg), SpO2 98 %.  Allergies  Allergen Reactions   Ginger Hives   Amoxil [Amoxicillin] Hives       Review of Systems  Constitutional:  Positive for chills and fever.  HENT:  Positive for congestion. Negative for rhinorrhea, sneezing and sore throat.   Gastrointestinal:  Negative for abdominal pain, diarrhea, nausea and vomiting.  Musculoskeletal:  Positive for myalgias.  Neurological:  Positive for headaches. Negative for dizziness, syncope and light-headedness.      Objective:   Physical Exam Vitals and nursing note reviewed.  Constitutional:      Appearance: Normal appearance. She is normal weight.  HENT:     Head: Normocephalic and atraumatic.  Eyes:     Extraocular Movements: Extraocular movements intact.     Conjunctiva/sclera: Conjunctivae normal.     Pupils: Pupils are equal, round, and reactive to light.  Cardiovascular:     Rate and Rhythm: Normal rate and regular rhythm.     Heart sounds: No murmur heard.   No friction rub. No gallop.  Pulmonary:     Effort: Pulmonary effort is normal. No respiratory distress.     Breath sounds: Normal breath sounds. No wheezing.  Musculoskeletal:        General: Normal range of motion.     Cervical back: Normal range of motion and neck supple.  Skin:    General: Skin is warm and dry.  Neurological:     General: No focal deficit present.     Mental Status: She is alert and oriented to person, place, and time. Mental status is at baseline.   Psychiatric:        Mood and Affect: Mood normal.        Behavior: Behavior normal.        Thought Content: Thought content normal.        Judgment: Judgment normal.   Appears as if she has malaise  Results for orders placed or performed in visit on 04/23/21 (from the past 24 hour(s))  POCT Influenza A/B     Status: Abnormal   Collection Time: 04/23/21  2:09 PM  Result Value Ref Range   Influenza A, POC Positive (A) Negative   Influenza B, POC Negative Negative        Assessment & Plan:  Influenza A Rest, increase fluids, monitor and treat fever with Motrin or Tylenol per package instructions. Meds ordered this encounter  Medications   oseltamivir (TAMIFLU) 75 MG capsule    Sig: Take 1 capsule (75 mg total) by mouth 2 (two) times daily.    Dispense:  10 capsule    Refill:  0   Patient verbalizes understanding and has no questions at discharge.

## 2021-04-23 NOTE — Patient Instructions (Addendum)
Influenza, Adult Influenza is also called "the flu." It is an infection in the lungs, nose, and throat (respiratory tract). It spreads easily from person to person (is contagious). The flu causes symptoms that are like a cold, along with high fever and body aches. What are the causes? This condition is caused by the influenza virus. You can get the virus by: Breathing in droplets that are in the air after a person infected with the flu coughed or sneezed. Touching something that has the virus on it and then touching your mouth, nose, or eyes. What increases the risk? Certain things may make you more likely to get the flu. These include: Not washing your hands often. Having close contact with many people during cold and flu season. Touching your mouth, eyes, or nose without first washing your hands. Not getting a flu shot every year. You may have a higher risk for the flu, and serious problems, such as a lung infection (pneumonia), if you: Are older than 65. Are pregnant. Have a weakened disease-fighting system (immune system) because of a disease or because you are taking certain medicines. Have a long-term (chronic) condition, such as: Heart, kidney, or lung disease. Diabetes. Asthma. Have a liver disorder. Are very overweight (morbidly obese). Have anemia. What are the signs or symptoms? Symptoms usually begin suddenly and last 4-14 days. They may include: Fever and chills. Headaches, body aches, or muscle aches. Sore throat. Cough. Runny or stuffy (congested) nose. Feeling discomfort in your chest. Not wanting to eat as much as normal. Feeling weak or tired. Feeling dizzy. Feeling sick to your stomach or throwing up. How is this treated? If the flu is found early, you can be treated with antiviral medicine. This can help to reduce how bad the illness is and how long it lasts. This may be given by mouth or through an IV tube. Taking care of yourself at home can help your  symptoms get better. Your doctor may want you to: Take over-the-counter medicines. Drink plenty of fluids. The flu often goes away on its own. If you have very bad symptoms or other problems, you may be treated in a hospital. Follow these instructions at home:   Activity Rest as needed. Get plenty of sleep. Stay home from work or school as told by your doctor. Do not leave home until you do not have a fever for 24 hours without taking medicine. Leave home only to go to your doctor. Eating and drinking Take an ORS (oral rehydration solution). This is a drink that is sold at pharmacies and stores. Drink enough fluid to keep your pee pale yellow. Drink clear fluids in small amounts as you are able. Clear fluids include: Water. Ice chips. Fruit juice mixed with water. Low-calorie sports drinks. Eat bland foods that are easy to digest. Eat small amounts as you are able. These foods include: Bananas. Applesauce. Rice. Lean meats. Toast. Crackers. Do not eat or drink: Fluids that have a lot of sugar or caffeine. Alcohol. Spicy or fatty foods. General instructions Take over-the-counter and prescription medicines only as told by your doctor. Use a cool mist humidifier to add moisture to the air in your home. This can make it easier for you to breathe. When using a cool mist humidifier, clean it daily. Empty water and replace with clean water. Cover your mouth and nose when you cough or sneeze. Wash your hands with soap and water often and for at least 20 seconds. This is also important after  you cough or sneeze. If you cannot use soap and water, use alcohol-based hand sanitizer. Keep all follow-up visits. How is this prevented?  Get a flu shot every year. You may get the flu shot in late summer, fall, or winter. Ask your doctor when you should get your flu shot. Avoid contact with people who are sick during fall and winter. This is cold and flu season. Contact a doctor if: You get  new symptoms. You have: Chest pain. Watery poop (diarrhea). A fever. Your cough gets worse. You start to have more mucus. You feel sick to your stomach. You throw up. Get help right away if you: Have shortness of breath. Have trouble breathing. Have skin or nails that turn a bluish color. Have very bad pain or stiffness in your neck. Get a sudden headache. Get sudden pain in your face or ear. Cannot eat or drink without throwing up. These symptoms may represent a serious problem that is an emergency. Get medical help right away. Call your local emergency services (911 in the [place] Do not wait to see if the symptoms will go away. Do not drive yourself to the hospital. Summary Influenza is also called "the flu." It is an infection in the lungs, nose, and throat. It spreads easily from person to person. Take over-the-counter and prescription medicines only as told by your doctor. Getting a flu shot every year is the best way to not get the flu. This information is not intended to replace advice given to you by your health care provider. Make sure you discuss any questions you have with your health care provider. Document Revised: 12/31/2019 Document Reviewed: 12/31/2019 Elsevier Patient Education  [place]. Oseltamivir Capsules What is this medication? OSELTAMIVIR (os el TAM i vir) prevents and treats infections caused by the flu virus (influenza). It works by slowing the spread of the flu virus in your body and reducing how long your symptoms last. It will not treat colds or infections caused by bacteria or other viruses. It will not replace the annual flu vaccine. This medicine may be used for other purposes; ask your health care provider or pharmacist if you have questions. COMMON BRAND NAME(S): Tamiflu What should I tell my care team before I take this medication? They need to know if you have any of the following conditions: Difficulty swallowing Kidney disease An  unusual or allergic reaction to oseltamivir, other medications, foods, dyes, or preservatives Pregnant or trying to get pregnant Breast-feeding How should I use this medication? Take this medication by mouth with water. Take it as directed on the prescription label at the same time every day. You can take it with or without food. If it upsets your stomach, take it with food. Take all of this medication unless your care team tells you to stop it early. Keep taking it even if you think you are better. When taking whole capsules: Do not cut, crush or chew this medication. Swallow the capsules whole. When mixing capsule contents with liquid: Open the capsule and mix the contents in a small bowl with a small amount of a sweetened liquid such as chocolate syrup, corn syrup, caramel topping, or light brown sugar (dissolved in water). Stir the mixture and take the dose right away after mixing. Talk to your care team about the use of this medication in children. While it may be prescribed for selected conditions, precautions do apply. Overdosage: If you think you have taken too much of this medicine contact a  poison control center or emergency room at once. NOTE: This medicine is only for you. Do not share this medicine with others. What if I miss a dose? If you miss a dose, take it as soon as you remember. If it is almost time for your next dose (within 2 hours), take only that dose. Do not take double or extra doses. What may interact with this medication? Intranasal influenza vaccine This list may not describe all possible interactions. Give your health care provider a list of all the medicines, herbs, non-prescription drugs, or dietary supplements you use. Also tell them if you smoke, drink alcohol, or use illegal drugs. Some items may interact with your medicine. What should I watch for while using this medication? Visit your care team for regular checks on your progress. Tell your care team if your  symptoms do not start to get better or if they get worse. If you have the flu, you may be at an increased risk of developing seizures, confusion, or abnormal behavior. This occurs early in the illness, and more frequently in children and teens. These events are not common, but may result in accidental injury to the patient. Families and caregivers of patients should watch for signs of unusual behavior and contact a care team right away if the patient shows signs of unusual behavior. To treat the flu, start taking this medication within 2 days of getting flu symptoms. This medication is not a substitute for the flu shot. Talk to your care team each year about an annual flu shot. What side effects may I notice from receiving this medication? Side effects that you should report to your care team as soon as possible: Allergic reactions--skin rash, itching, hives, swelling of the face, lips, tongue, or throat Confusion Hallucinations Redness, blistering, peeling, or loosening of the skin, including inside the mouth Seizures Tremors or shaking Trouble speaking Unusual changes in behavior Side effects that usually do not require medical attention (report to your care team if they continue or are bothersome): Headache Nausea Vomiting This list may not describe all possible side effects. Call your doctor for medical advice about side effects. You may report side effects to FDA at 1-800-FDA-1088. Where should I keep my medication? Keep out of the reach of children and pets. Store at room temperature between 15 and 30 degrees C (59 and 86 degrees F). Throw away any unused medication after the expiration date. NOTE: This sheet is a summary. It may not cover all possible information. If you have questions about this medicine, talk to your doctor, pharmacist, or health care provider.  2022 Elsevier/Gold Standard (2021-01-30 00:00:00)

## 2021-05-30 ENCOUNTER — Other Ambulatory Visit: Payer: Self-pay | Admitting: Nurse Practitioner

## 2021-05-30 DIAGNOSIS — J0110 Acute frontal sinusitis, unspecified: Secondary | ICD-10-CM

## 2021-05-30 MED ORDER — DOXYCYCLINE HYCLATE 100 MG PO TABS
100.0000 mg | ORAL_TABLET | Freq: Two times a day (BID) | ORAL | 0 refills | Status: AC
Start: 2021-05-30 — End: 2021-06-06

## 2021-05-30 NOTE — Progress Notes (Signed)
Subjective   Patient ID: Cassandra Pham, female    DOB: 08/27/1969, 46 y.o.   MRN: [id number]   HPI   Sick for 2 weeks with nasal congestion and cough that has been worsening in the past 48 hours.  She has been taking Claritin and Mucinex without relief.  Feels more pressure around her nose and symptoms are worse in the evening.  Also noted her drainage has been darker in the past few days.          Today's Vitals    05/30/21 1433  BP: 119/71  Pulse: 83  Resp: 16  Temp: 97.9 F (36.6 C)  SpO2: 100%    There is no height or weight on file to calculate BMI.         Objective:    Objective   Physical Exam HEENT: PERRLA, EOMI, nasal passages with erythema to turbinates drainage noted, sinus tenderness to maxillary regions bilaterally  Left TM obstructed with cerumen  Right WNL  PND noted pharynx with mild erythema no inflammation   Lungs: Clear throughout, deep breaths without distress,  Skin: Warm to touch            Assessment & Plan:  1. Acute non-recurrent frontal sinusitis   - doxycycline (VIBRA-TABS) 100 MG tablet; Take 1 tablet (100 mg total) by mouth 2 (two) times daily for 7 days.  Dispense: 14 tablet; Refill: 0

## 2021-06-04 ENCOUNTER — Ambulatory Visit: Payer: BC Managed Care – PPO | Admitting: Medical

## 2021-06-04 ENCOUNTER — Other Ambulatory Visit: Payer: Self-pay

## 2021-06-04 ENCOUNTER — Encounter: Payer: Self-pay | Admitting: Medical

## 2021-06-04 VITALS — BP 110/64 | HR 90 | Temp 97.4°F | Resp 18

## 2021-06-04 DIAGNOSIS — H6122 Impacted cerumen, left ear: Secondary | ICD-10-CM

## 2021-06-04 NOTE — Progress Notes (Signed)
Seen by Apolonio Schneiders on 1/4//23 and treated with antibiotics for sinusitis. Also noted is  left cerumen impaction. She has only been using her Debrox once in a while.   Tried to remove with a cuvette with no luck the tried to irrigate it out but it is too hard for that.  Recommended she use 10 drops twice daily and to return to clinic on Wednesday for irrigation. She is going to [place] on Thursday morning.   She is thankful for our care. Always a pleasure to see Makaiya.

## 2021-06-06 ENCOUNTER — Ambulatory Visit: Payer: BC Managed Care – PPO | Admitting: Medical

## 2021-06-06 ENCOUNTER — Other Ambulatory Visit: Payer: Self-pay

## 2021-06-06 DIAGNOSIS — H60592 Other noninfective acute otitis externa, left ear: Secondary | ICD-10-CM

## 2021-06-06 DIAGNOSIS — H6122 Impacted cerumen, left ear: Secondary | ICD-10-CM

## 2021-06-06 DIAGNOSIS — H9202 Otalgia, left ear: Secondary | ICD-10-CM

## 2021-06-06 MED ORDER — NEOMYCIN-POLYMYXIN-HC 3.5-10000-1 OT SOLN: 3.0000 [drp] | Freq: Four times a day (QID) | OTIC | 0 refills | Status: AC

## 2021-06-06 MED ORDER — IBUPROFEN 800 MG PO TABS: 800.0000 mg | ORAL_TABLET | Freq: Three times a day (TID) | ORAL | 0 refills | Status: DC | PRN

## 2021-06-06 NOTE — Patient Instructions (Signed)
Otitis Externa Otitis externa is an infection of the outer ear canal. The outer ear canal is the area between the outside of the ear and the eardrum. Otitis externa is sometimes called swimmer's ear. What are the causes? Common causes of this condition include: Swimming in dirty water. Moisture in the ear. An injury to the inside of the ear. An object stuck in the ear. A cut or scrape on the outside of the ear or in the ear canal. What increases the risk? You are more likely to get this condition if you go swimming often. What are the signs or symptoms? Itching in the ear. This is often the first symptom. Swelling of the ear. Redness in the ear. Ear pain. The pain may get worse when you pull on your ear. Pus coming from the ear. How is this treated? This condition may be treated with: Antibiotic ear drops. These are often given for 10-14 days. Medicines to reduce itching and swelling. Follow these instructions at home: If you were prescribed antibiotic ear drops, use them as told by your doctor. Do not stop using them even if you start to feel better. Take over-the-counter and prescription medicines only as told by your doctor. Avoid getting water in your ears as told by your doctor. You may be told to avoid swimming or water sports for a few days. Keep all follow-up visits. How is this prevented? Keep your ears dry. Use the corner of a towel to dry your ears after you swim or bathe. Try not to scratch or put things in your ear. Doing these things makes it easier for germs to grow in your ear. Avoid swimming in lakes, dirty water, or swimming pools that may not have the right amount of a chemical called chlorine. Contact a doctor if: You have a fever. Your ear is still red, swollen, or painful after 3 days. You still have pus coming from your ear after 3 days. Your redness, swelling, or pain gets worse. You have a very bad headache. Get help right away if: You have redness,  swelling, and pain or tenderness behind your ear. Summary Otitis externa is an infection of the outer ear canal. Symptoms include pain, redness, and swelling of the ear. If you were prescribed antibiotic ear drops, use them as told by your doctor. Do not stop using them even if you start to feel better. Try not to scratch or put things in your ear. This information is not intended to replace advice given to you by your health care provider. Make sure you discuss any questions you have with your health care provider. Document Revised: 07/26/2020 Document Reviewed: 07/26/2020 Elsevier Patient Education  [place].

## 2021-06-12 NOTE — Progress Notes (Signed)
Cerumen irrigation by RN  Some tenderness on left ear canal. Wax removed showe base of canal with an abrasion. Meds ordered this encounter  Medications   ibuprofen (ADVIL) 800 MG tablet    Sig: Take 1 tablet (800 mg total) by mouth every 8 (eight) hours as needed.    Dispense:  30 tablet    Refill:  0   neomycin-polymyxin-hydrocortisone (CORTISPORIN) OTIC solution    Sig: Place 3 drops into the left ear 4 (four) times daily for 7 days.    Dispense:  4.2 mL    Refill:  0    Return to the clinic as needed. Patient verbalizes undersstanding and has no questions at  discharge.

## 2021-07-11 ENCOUNTER — Ambulatory Visit: Payer: BC Managed Care – PPO | Admitting: Medical

## 2021-07-11 ENCOUNTER — Encounter: Payer: Self-pay | Admitting: Medical

## 2021-07-11 ENCOUNTER — Other Ambulatory Visit: Payer: Self-pay

## 2021-07-11 ENCOUNTER — Encounter: Payer: Self-pay | Admitting: Neurology

## 2021-07-11 VITALS — BP 102/64 | HR 81 | Temp 97.4°F | Resp 16

## 2021-07-11 DIAGNOSIS — G43901 Migraine, unspecified, not intractable, with status migrainosus: Secondary | ICD-10-CM

## 2021-07-11 DIAGNOSIS — R509 Fever, unspecified: Secondary | ICD-10-CM

## 2021-07-11 DIAGNOSIS — R6889 Other general symptoms and signs: Secondary | ICD-10-CM

## 2021-07-11 DIAGNOSIS — Z20822 Contact with and (suspected) exposure to covid-19: Secondary | ICD-10-CM

## 2021-07-11 LAB — POCT INFLUENZA A/B
Influenza A, POC: NEGATIVE
Influenza B, POC: NEGATIVE

## 2021-07-11 LAB — POC COVID19 BINAXNOW: SARS Coronavirus 2 Ag: NEGATIVE

## 2021-07-11 MED ORDER — KETOROLAC TROMETHAMINE 30 MG/ML IJ SOLN
30.0000 mg | Freq: Once | INTRAMUSCULAR | Status: AC
  Administered 2021-07-11: 30 mg via INTRAMUSCULAR

## 2021-07-11 MED ORDER — IBUPROFEN 800 MG PO TABS: 800.0000 mg | ORAL_TABLET | Freq: Three times a day (TID) | ORAL | 0 refills | Status: DC | PRN

## 2021-07-11 NOTE — Patient Instructions (Signed)
Migraine Headache A migraine headache is a very strong throbbing pain on one side or both sides of your head. This type of headache can also cause other symptoms. It can last from 4 hours to 3 days. Talk with your doctor about what things may bring on (trigger) this condition. What are the causes? The exact cause of this condition is not known. This condition may be triggered or caused by: Drinking alcohol. Smoking. Taking medicines, such as: Medicine used to treat chest pain (nitroglycerin). Birth control pills. Estrogen. Some blood pressure medicines. Eating or drinking certain products. Doing physical activity. Other things that may trigger a migraine headache include: Having a menstrual period. Pregnancy. Hunger. Stress. Not getting enough sleep or getting too much sleep. Weather changes. Tiredness (fatigue). What increases the risk? Being 25-55 years old. Being female. Having a family history of migraine headaches. Being Caucasian. Having depression or anxiety. Being very overweight. What are the signs or symptoms? A throbbing pain. This pain may: Happen in any area of the head, such as on one side or both sides. Make it hard to do daily activities. Get worse with physical activity. Get worse around bright lights or loud noises. Other symptoms may include: Feeling sick to your stomach (nauseous). Vomiting. Dizziness. Being sensitive to bright lights, loud noises, or smells. Before you get a migraine headache, you may get warning signs (an aura). An aura may include: Seeing flashing lights or having blind spots. Seeing bright spots, halos, or zigzag lines. Having tunnel vision or blurred vision. Having numbness or a tingling feeling. Having trouble talking. Having weak muscles. Some people have symptoms after a migraine headache (postdromal phase), such as: Tiredness. Trouble thinking (concentrating). How is this treated? Taking medicines that: Relieve  pain. Relieve the feeling of being sick to your stomach. Prevent migraine headaches. Treatment may also include: Having acupuncture. Avoiding foods that bring on migraine headaches. Learning ways to control your body functions (biofeedback). Therapy to help you know and deal with negative thoughts (cognitive behavioral therapy). Follow these instructions at home: Medicines Take over-the-counter and prescription medicines only as told by your doctor. Ask your doctor if the medicine prescribed to you: Requires you to avoid driving or using heavy machinery. Can cause trouble pooping (constipation). You may need to take these steps to prevent or treat trouble pooping: Drink enough fluid to keep your pee (urine) pale yellow. Take over-the-counter or prescription medicines. Eat foods that are high in fiber. These include beans, whole grains, and fresh fruits and vegetables. Limit foods that are high in fat and sugar. These include fried or sweet foods. Lifestyle Do not drink alcohol. Do not use any products that contain nicotine or tobacco, such as cigarettes, e-cigarettes, and chewing tobacco. If you need help quitting, ask your doctor. Get at least 8 hours of sleep every night. Limit and deal with stress. General instructions   Keep a journal to find out what may bring on your migraine headaches. For example, write down: What you eat and drink. How much sleep you get. Any change in what you eat or drink. Any change in your medicines. If you have a migraine headache: Avoid things that make your symptoms worse, such as bright lights. It may help to lie down in a dark, quiet room. Do not drive or use heavy machinery. Ask your doctor what activities are safe for you. Keep all follow-up visits as told by your doctor. This is important. Contact a doctor if: You get a migraine headache that   is different or worse than others you have had. You have more than 15 headache days in one  month. Get help right away if: Your migraine headache gets very bad. Your migraine headache lasts longer than 72 hours. You have a fever. You have a stiff neck. You have trouble seeing. Your muscles feel weak or like you cannot control them. You start to lose your balance a lot. You start to have trouble walking. You pass out (faint). You have a seizure. Summary A migraine headache is a very strong throbbing pain on one side or both sides of your head. These headaches can also cause other symptoms. This condition may be treated with medicines and changes to your lifestyle. Keep a journal to find out what may bring on your migraine headaches. Contact a doctor if you get a migraine headache that is different or worse than others you have had. Contact your doctor if you have more than 15 headache days in a month. This information is not intended to replace advice given to you by your health care provider. Make sure you discuss any questions you have with your health care provider. Document Revised: 09/04/2018 Document Reviewed: 06/25/2018 Elsevier Patient Education  2022 Elsevier Inc.  

## 2021-07-11 NOTE — Progress Notes (Signed)
° °  Subjective:    Patient ID: Cassandra Pham, female    DOB: 11-30-1975, 45 y.o.   MRN: [id number]  HPI 46 yo female in non acute distress, consents to telemedicine appointment.. Having Migraines, painful on frontal  HA x 2 weeks. Yesterday with fever of 101. She states she took a whole bottle of Excedrin.   2 days ago  tested for Covid-19 and was negative.   Review of Systems  Constitutional:  Positive for fever. Negative for chills.  HENT:  Negative for congestion.   Eyes:  Positive for photophobia. Negative for pain and visual disturbance.  Neurological:  Positive for headaches.      Objective:   Physical Exam  No physical exam was performed.      Assessment & Plan:  Migraine Patient to come to clinic for examination.

## 2021-07-11 NOTE — Progress Notes (Signed)
Subjective:    Patient ID: Cassandra Pham, female    DOB: Mar 21, 1976, 46 y.o.   MRN: [id number]  HPI 46 yo female in non acute distress. Excedrin since Saturday every 4-6 hours. [address] and  nasal congestion.  Fever 101 on Monday.  Covid test on Saturday was negative.   Review of Systems  Constitutional:  Positive for chills and fever (Monday only then on Tylenol (Excedrin Migraine) ever since).  Eyes:  Positive for photophobia.  Neurological:  Positive for headaches.      Objective:   Physical Exam Vitals and nursing note reviewed.  Constitutional:      Appearance: Normal appearance.  Eyes:     Extraocular Movements: Extraocular movements intact.     Conjunctiva/sclera: Conjunctivae normal.     Pupils: Pupils are equal, round, and reactive to light.  Pulmonary:     Effort: Pulmonary effort is normal.  Musculoskeletal:        General: Normal range of motion.     Cervical back: Normal range of motion and neck supple.  Skin:    General: Skin is warm and dry.  Neurological:     General: No focal deficit present.     Mental Status: She is alert and oriented to person, place, and time. Mental status is at baseline.  Psychiatric:        Mood and Affect: Mood normal.        Behavior: Behavior normal.        Thought Content: Thought content normal.        Judgment: Judgment normal.    Recent Results (from the past 2160 hour(s))  POCT Influenza A/B     Status: Abnormal   Collection Time: 04/23/21  2:09 PM  Result Value Ref Range   Influenza A, POC Positive (A) Negative   Influenza B, POC Negative Negative  CBC with Differential/Platelet     Status: None   Collection Time: 07/11/21 11:51 AM  Result Value Ref Range   WBC 6.0 3.4 - 10.8 x10E3/uL   RBC 4.00 3.77 - 5.28 x10E6/uL   Hemoglobin 12.6 11.1 - 15.9 g/dL   Hematocrit 37.1 34.0 - 46.6 %   MCV 93 79 - 97 fL   MCH 31.5 26.6 - 33.0 pg   MCHC 34.0 31.5 - 35.7 g/dL   RDW 12.6 11.7 - 15.4 %   Platelets 342 150 -  450 x10E3/uL   Neutrophils 49 Not Estab. %   Lymphs 36 Not Estab. %   Monocytes 8 Not Estab. %   Eos 6 Not Estab. %   Basos 1 Not Estab. %   Neutrophils Absolute 3.0 1.4 - 7.0 x10E3/uL   Lymphocytes Absolute 2.2 0.7 - 3.1 x10E3/uL   Monocytes Absolute 0.5 0.1 - 0.9 x10E3/uL   EOS (ABSOLUTE) 0.4 0.0 - 0.4 x10E3/uL   Basophils Absolute 0.0 0.0 - 0.2 x10E3/uL   Immature Granulocytes 0 Not Estab. %   Immature Grans (Abs) 0.0 0.0 - 0.1 x10E3/uL  Comprehensive metabolic panel     Status: None   Collection Time: 07/11/21 11:51 AM  Result Value Ref Range   Glucose 97 70 - 99 mg/dL   BUN 12 6 - 24 mg/dL   Creatinine, Ser 0.85 0.57 - 1.00 mg/dL   eGFR 85 >59 mL/min/1.73   BUN/Creatinine Ratio 14 9 - 23   Sodium 140 134 - 144 mmol/L   Potassium 4.6 3.5 - 5.2 mmol/L   Chloride 104 96 - 106 mmol/L  CO2 25 20 - 29 mmol/L   Calcium 9.3 8.7 - 10.2 mg/dL   Total Protein 7.2 6.0 - 8.5 g/dL   Albumin 4.2 3.8 - 4.8 g/dL   Globulin, Total 3.0 1.5 - 4.5 g/dL   Albumin/Globulin Ratio 1.4 1.2 - 2.2   Bilirubin Total 0.2 0.0 - 1.2 mg/dL   Alkaline Phosphatase 71 44 - 121 IU/L   AST 22 0 - 40 IU/L   ALT 17 0 - 32 IU/L  POC COVID-19     Status: Normal   Collection Time: 07/11/21 11:54 AM  Result Value Ref Range   SARS Coronavirus 2 Ag Negative Negative  POCT Influenza A/B     Status: Normal   Collection Time: 07/11/21 11:54 AM  Result Value Ref Range   Influenza A, POC Negative Negative   Influenza B, POC Negative Negative         Assessment & Plan:  Migraine Headache.  Meds ordered this encounter  Medications   ketorolac (TORADOL) 30 MG/ML injection 30 mg   ibuprofen (ADVIL) 800 MG tablet    Sig: Take 1 tablet (800 mg total) by mouth every 8 (eight) hours as needed.    Dispense:  30 tablet    Refill:  0    Orders Placed This Encounter  Procedures   CBC with Differential/Platelet   Comprehensive metabolic panel   Ambulatory referral to Neurology    Referral Priority:   Routine     Referral Type:   Consultation    Referral Reason:   Specialty Services Required    Requested Specialty:   Neurology    Number of Visits Requested:   1   POC COVID-19    Order Specific Question:   Previously tested for COVID-19    Answer:   Yes    Order Specific Question:   Resident in a congregate (group) care setting    Answer:   No    Order Specific Question:   Employed in healthcare setting    Answer:   No    Order Specific Question:   Pregnant    Answer:   No   POCT Influenza A/B    Patient feeling a little better when she left. I recommended OTC Mg amd Benadryl per package instructions. She already has an appoitnment iwht Neuro[facility] for her Headaches/Migraines in May. Patient verbalizes understanding and has no questions at discharge.

## 2021-07-12 ENCOUNTER — Encounter: Payer: Self-pay | Admitting: Nurse Practitioner

## 2021-07-12 LAB — COMPREHENSIVE METABOLIC PANEL
ALT: 17 IU/L (ref 0–32)
AST: 22 IU/L (ref 0–40)
Albumin/Globulin Ratio: 1.4 (ref 1.2–2.2)
Albumin: 4.2 g/dL (ref 3.8–4.8)
Alkaline Phosphatase: 71 IU/L (ref 44–121)
BUN/Creatinine Ratio: 14 (ref 9–23)
BUN: 12 mg/dL (ref 6–24)
Bilirubin Total: 0.2 mg/dL (ref 0.0–1.2)
CO2: 25 mmol/L (ref 20–29)
Calcium: 9.3 mg/dL (ref 8.7–10.2)
Chloride: 104 mmol/L (ref 96–106)
Creatinine, Ser: 0.85 mg/dL (ref 0.57–1.00)
Globulin, Total: 3 g/dL (ref 1.5–4.5)
Glucose: 97 mg/dL (ref 70–99)
Potassium: 4.6 mmol/L (ref 3.5–5.2)
Sodium: 140 mmol/L (ref 134–144)
Total Protein: 7.2 g/dL (ref 6.0–8.5)
eGFR: 85 mL/min/{1.73_m2} (ref >59)

## 2021-07-12 LAB — CBC WITH DIFFERENTIAL/PLATELET
Basophils Absolute: 0 10*3/uL (ref 0.0–0.2)
Basos: 1 %
EOS (ABSOLUTE): 0.4 10*3/uL (ref 0.0–0.4)
Eos: 6 %
Hematocrit: 37.1 % (ref 34.0–46.6)
Hemoglobin: 12.6 g/dL (ref 11.1–15.9)
Immature Grans (Abs): 0 10*3/uL (ref 0.0–0.1)
Immature Granulocytes: 0 %
Lymphocytes Absolute: 2.2 10*3/uL (ref 0.7–3.1)
Lymphs: 36 %
MCH: 31.5 pg (ref 26.6–33.0)
MCHC: 34 g/dL (ref 31.5–35.7)
MCV: 93 fL (ref 79–97)
Monocytes Absolute: 0.5 10*3/uL (ref 0.1–0.9)
Monocytes: 8 %
Neutrophils Absolute: 3 10*3/uL (ref 1.4–7.0)
Neutrophils: 49 %
Platelets: 342 10*3/uL (ref 150–450)
RBC: 4 x10E6/uL (ref 3.77–5.28)
RDW: 12.6 % (ref 11.7–15.4)
WBC: 6 10*3/uL (ref 3.4–10.8)

## 2021-07-16 ENCOUNTER — Encounter: Payer: Self-pay | Admitting: Medical

## 2021-08-27 NOTE — Progress Notes (Signed)
? ?NEUROLOGY CONSULTATION NOTE ? ?Cassandra Pham ?MRN: [id number] ?DOB: [birth year] ? ?Referring provider: Daryll Drown, PA-C ?Primary care provider: Alma Friendly, NP ? ?Reason for consult:  migraines ? ?Assessment/Plan:  ? ?Migraine without aura, without status migrainosus, not intractable - suspect worse over the past 2 months due to change in vision (has not adapted to new eye prescription) and stressors (related to her son). ? ?Recommend follow up with eye doctor to recheck vision/prescription. ?Migraine prevention:  start nortriptyline 10mg  at bedtime.  Increase to 25mg  at bedtime in 4 weeks if needed ?Migraine rescue:  rizatriptan 10mg  or Excedrin Migraine  ?Limit use of pain relievers (rizatriptan, Excedrin) to no more than 2 days out of week to prevent risk of rebound or medication-overuse headache. ?Keep headache diary ?Follow up 5 months ? ? ? ?Subjective:  ?Cassandra Pham is a 46 year old right-handed female with asthma who presents for migraines.  History supplemented by referring provider's notes. ? ?Onset:  Off an on for several years.  More consistent around 46 years old, worse over past couple of months.  Got new prescription a couple of months ago and now vision is off ?Location:  both temples ?Quality:  pressure/sharp/pounding ?Intensity:  2-5/10 if treats immediately, otherwise 10/10 if severe ?Aura:  absent ?Prodrome:  absent ?Associated symptoms:  Photophobia, phonophobia.  Usually no nausea but rarely.  She denies associated vomiting, visual disturbance, unilateral numbness or weakness. ?Duration:  2 hours.  Recently had one lasting a day. ?Frequency:  Once a week, severe occurring every other week.  ?Frequency of abortive medication: 2-3 days a week ?Triggers:  stress, hormone ?Relieving factors:  Excedrin Migraine, sleep ?Activity:  If severe, cannot function ? ?Current NSAIDS/analgesics:  Excedrin Migraine ?Current triptans:  none ?Current ergotamine:  none ?Current anti-emetic:   none ?Current muscle relaxants:  none ?Current Antihypertensive medications:  none ?Current Antidepressant medications:  sertraline ?Current Anticonvulsant medications:  none ?Current anti-CGRP:  none ?Current Vitamins/Herbal/Supplements:  none ?Current Antihistamines/Decongestants:  none ?Other therapy:  none ?Hormone/birth control:  none ? ? ?Past NSAIDS/analgesics:  diclofenac 75mg , acetaminophen, ibuprofen 800mg , Toradol shot (helps) ?Past abortive triptans:  sumatriptan tab ?Past abortive ergotamine:  none ?Past muscle relaxants:  cyclobenzaprine, tizanidine ?Past anti-emetic:  ondansetron 4mg , promethazine ?Past antihypertensive medications:  none ?Past antidepressant medications:  none ?Past anticonvulsant medications:  none ?Past anti-CGRP:  none ?Past vitamins/Herbal/Supplements:  B12, D ?Past antihistamines/decongestants:  none ?Other past therapies:  none ? ?Caffeine:  1 cup coffee daily ?Alcohol:  occasional ?Smoker:  no ?Diet:  Does not drink much water during winter.  Drinks Sprite.  Skips meals ?Exercise:  no ?Depression/Anxiety:  yes ?Other pain:  no ?Sleep hygiene:  varies ?Family history of headache:  no ? ?  ? ? ?PAST MEDICAL HISTORY: ?Past Medical History:  ?Diagnosis Date  ? Anxiety and depression   ? Asthma   ? Bacterial vaginosis   ? BRCA negative 02/2020  ? MyRisk neg except NTHL1 VUS; IBIS=7.4%/riskscore=7.2%  ? Family history of ovarian cancer 02/2019  ? MyRisk testing declined  ? Family history of pancreatic cancer   ? Medical history non-contributory   ? Migraine   ? UTI (urinary tract infection)   ? ? ?PAST SURGICAL HISTORY: ?Past Surgical History:  ?Procedure Laterality Date  ? ABLATION    ? APPENDECTOMY    ? ESOPHAGOGASTRODUODENOSCOPY (EGD) WITH PROPOFOL N/A 10/05/2015  ? Procedure: ESOPHAGOGASTRODUODENOSCOPY (EGD) WITH PROPOFOL;  Surgeon: Hulen Luster, MD;  Location: [facility] ENDOSCOPY;  Service: Gastroenterology;  Laterality: N/A;  ? TUBAL LIGATION    ? ? ?MEDICATIONS: ?Current Outpatient  Medications on File Prior to Visit  ?Medication Sig Dispense Refill  ? acetaminophen (TYLENOL) 500 MG tablet Take 500 mg by mouth every 6 (six) hours as needed.    ? ibuprofen (ADVIL) 800 MG tablet Take 1 tablet (800 mg total) by mouth every 8 (eight) hours as needed. 30 tablet 0  ? oseltamivir (TAMIFLU) 75 MG capsule Take 1 capsule (75 mg total) by mouth 2 (two) times daily. 10 capsule 0  ? ?No current facility-administered medications on file prior to visit.  ? ? ?ALLERGIES: ?Allergies  ?Allergen Reactions  ? Ginger Hives  ? Amoxil [Amoxicillin] Hives  ? ? ?FAMILY HISTORY: ?Family History  ?Problem Relation Age of Onset  ? Hypertension Mother   ? Diabetes Father   ?     DM Type 2  ? Hypercholesterolemia Father   ? Pancreatic cancer Maternal Grandmother 33  ? Ovarian cancer Maternal Aunt 73  ? Bone cancer Maternal Uncle 50  ? Breast cancer Cousin 14  ? Breast cancer Paternal Aunt 75  ? ? ?Objective:  ?Blood pressure 123/84, pulse 75, height 5' (1.524 m), weight 121 lb 6.4 oz (55.1 kg), SpO2 98 %. ?General: No acute distress.  Patient appears well-groomed.   ?Head:  Normocephalic/atraumatic ?Eyes:  fundi examined but not visualized ?Neck: supple, no paraspinal tenderness, full range of motion ?Back: No paraspinal tenderness ?Heart: regular rate and rhythm ?Lungs: Clear to auscultation bilaterally. ?Vascular: No carotid bruits. ?Neurological Exam: ?Mental status: alert and oriented to person, place, and time, recent and remote memory intact, fund of knowledge intact, attention and concentration intact, speech fluent and not dysarthric, language intact. ?Cranial nerves: ?CN I: not tested ?CN II: pupils equal, round and reactive to light, visual fields intact ?CN III, IV, VI:  full range of motion, no nystagmus, no ptosis ?CN V: facial sensation intact. ?CN VII: upper and lower face symmetric ?CN VIII: hearing intact ?CN IX, X: gag intact, uvula midline ?CN XI: sternocleidomastoid and trapezius muscles intact ?CN XII:  tongue midline ?Bulk & Tone: normal, no fasciculations. ?Motor:  muscle strength 5/5 throughout ?Sensation:  Pinprick, temperature and vibratory sensation intact. ?Deep Tendon Reflexes:  2+ throughout,  toes downgoing.   ?Finger to nose testing:  Without dysmetria.   ?Heel to shin:  Without dysmetria.   ?Gait:  Normal station and stride.  Romberg negative. ? ? ? ?Thank you for allowing me to take part in the care of this patient. ? ?Metta Clines, DO ? ?CC:  Daryll Drown, PA-C ? Alma Friendly, NP ? ? ? ? ?

## 2021-08-28 ENCOUNTER — Ambulatory Visit (INDEPENDENT_AMBULATORY_CARE_PROVIDER_SITE_OTHER): Payer: BC Managed Care – PPO | Admitting: Neurology

## 2021-08-28 ENCOUNTER — Encounter: Payer: Self-pay | Admitting: Neurology

## 2021-08-28 VITALS — BP 123/84 | HR 75 | Ht 60.0 in | Wt 121.4 lb

## 2021-08-28 DIAGNOSIS — G43009 Migraine without aura, not intractable, without status migrainosus: Secondary | ICD-10-CM

## 2021-08-28 MED ORDER — RIZATRIPTAN BENZOATE 10 MG PO TABS: 10.0000 mg | ORAL_TABLET | ORAL | 5 refills | Status: DC | PRN

## 2021-08-28 MED ORDER — NORTRIPTYLINE HCL 10 MG PO CAPS: 10.0000 mg | ORAL_CAPSULE | Freq: Every day | ORAL | 5 refills | Status: DC

## 2021-08-28 NOTE — Patient Instructions (Signed)
?  Get a repeat eye exam ?Start nortriptyline '10mg'$  at bedtime.  Contact us in 4 weeks with update and we can increase dose if needed. ?Take rizatriptan '10mg'$  at earliest onset of headache.  May repeat dose once in 2 hours if needed.  Maximum 2 tablets in 24 hours. ?Limit use of pain relievers to no more than 2 days out of the week.  These medications include acetaminophen, NSAIDs (ibuprofen/Advil/Motrin, naproxen/Aleve, triptans (Imitrex/sumatriptan), Excedrin, and narcotics.  This will help reduce risk of rebound headaches. ?Be aware of common food triggers: ? - Caffeine:  coffee, black tea, cola, Mt. Dew ? - Chocolate ? - Dairy:  aged cheeses (brie, blue, cheddar, gouda, [place], provolone, [place], Swiss, etc), chocolate milk, buttermilk, sour cream, limit eggs and yogurt ? - Nuts, peanut butter ? - Alcohol ? - Cereals/grains:  FRESH breads (fresh bagels, sourdough, doughnuts), yeast productions ? - Processed/canned/aged/cured meats (pre-packaged deli meats, hotdogs) ? - MSG/glutamate:  soy sauce, flavor enhancer, pickled/preserved/marinated foods ? - Sweeteners:  aspartame (Equal, Nutrasweet).  Sugar and Splenda are okay ? - Vegetables:  legumes (lima beans, lentils, snow peas, fava beans, pinto peans, peas, garbanzo beans), sauerkraut, onions, olives, pickles ? - Fruit:  avocados, bananas, citrus fruit (orange, lemon, grapefruit), mango ? - Other:  Frozen meals, macaroni and cheese ?Routine exercise ?Stay adequately hydrated (aim for 64 oz water daily) ?Keep headache diary ?Maintain proper stress management ?Maintain proper sleep hygiene ?Do not skip meals ?Consider supplements:  magnesium citrate '400mg'$  daily, riboflavin '400mg'$  daily, coenzyme Q10 '100mg'$  three times daily. ? ?

## 2021-09-27 ENCOUNTER — Encounter: Payer: Self-pay | Admitting: Nurse Practitioner

## 2021-09-27 ENCOUNTER — Other Ambulatory Visit: Payer: Self-pay | Admitting: Nurse Practitioner

## 2021-09-27 ENCOUNTER — Ambulatory Visit: Payer: BC Managed Care – PPO | Admitting: Nurse Practitioner

## 2021-09-27 VITALS — BP 100/70 | HR 96 | Temp 98.0°F | Resp 16

## 2021-09-27 DIAGNOSIS — H1031 Unspecified acute conjunctivitis, right eye: Secondary | ICD-10-CM

## 2021-09-27 MED ORDER — OFLOXACIN 0.3 % OP SOLN: 2.0000 [drp] | Freq: Four times a day (QID) | OPHTHALMIC | 0 refills | Status: DC

## 2021-09-27 MED ORDER — POLYMYXIN B-TRIMETHOPRIM 10000-0.1 UNIT/ML-% OP SOLN: 1.0000 [drp] | OPHTHALMIC | 0 refills | Status: DC

## 2021-09-27 NOTE — Progress Notes (Signed)
? ?  Subjective:  ? ? Patient ID: Cassandra Pham, female    DOB: 1975-09-24, 46 y.o.   MRN: [id number] ? ?HPI ? ?46 year old female presenting to [facility] with complaints of right eye crusting this am and redness. She does wear contacts and has been wearing them this week. She has glasses on today.  ? ?Denies sick contacts  ? ?Today's Vitals  ? 09/27/21 1006  ?BP: 100/70  ?Pulse: 96  ?Resp: 16  ?Temp: 98 ?F (36.7 ?C)  ?TempSrc: Tympanic  ?SpO2: 98%  ? ?There is no height or weight on file to calculate BMI.  ? ?Review of Systems  ?Constitutional: Negative.   ?HENT: Negative.    ?Eyes:  Positive for discharge, redness and itching.  ?Respiratory: Negative.    ?Neurological: Negative.   ? ?   ?Objective:  ? Physical Exam ?HENT:  ?   Head: Normocephalic.  ?   Nose: Nose normal.  ?Eyes:  ?   General: Lids are normal.  ?   Conjunctiva/sclera:  ?   Right eye: Right conjunctiva is injected.  ?Skin: ?   General: Skin is warm.  ?Neurological:  ?   Mental Status: She is alert.  ? ? ? ? ? ?   ?Assessment & Plan:  ? ?1. Acute bacterial conjunctivitis of right eye ? ?- trimethoprim-polymyxin b (POLYTRIM) ophthalmic solution; Place 1 drop into both eyes every 4 (four) hours while awake.  Dispense: 10 mL; Refill: 0 ?   ?

## 2021-09-27 NOTE — Addendum Note (Signed)
Addended by: Apolonio Schneiders E on: 09/27/2021 12:52 PM ? ? Modules accepted: Orders ? ?

## 2021-10-04 ENCOUNTER — Telehealth: Payer: Self-pay | Admitting: Neurology

## 2021-10-04 NOTE — Telephone Encounter (Signed)
Patient called to give an update and requesting and increase on her nortriptyline. ? ?[facility] on [address] ?

## 2021-10-05 ENCOUNTER — Other Ambulatory Visit: Payer: Self-pay | Admitting: Neurology

## 2021-10-05 MED ORDER — NORTRIPTYLINE HCL 25 MG PO CAPS: 25.0000 mg | ORAL_CAPSULE | Freq: Every day | ORAL | 5 refills | Status: DC

## 2021-10-05 NOTE — Telephone Encounter (Signed)
A new prescription for nortriptyline '25mg'$  at bedtime has been sent to the [facility] in [address] [place].  If no improvement in 6 weeks, contact us again and we can still increase dose.   ?Patient advised.  ?

## 2021-10-08 ENCOUNTER — Ambulatory Visit: Payer: BC Managed Care – PPO | Admitting: Neurology

## 2021-12-05 ENCOUNTER — Encounter: Payer: Self-pay | Admitting: Medical

## 2021-12-19 ENCOUNTER — Other Ambulatory Visit: Payer: Self-pay | Admitting: Obstetrics and Gynecology

## 2021-12-19 DIAGNOSIS — Z1231 Encounter for screening mammogram for malignant neoplasm of breast: Secondary | ICD-10-CM

## 2021-12-27 ENCOUNTER — Encounter: Payer: Self-pay | Admitting: Nurse Practitioner

## 2022-01-10 ENCOUNTER — Ambulatory Visit
Admission: RE | Admit: 2022-01-10 | Discharge: 2022-01-10 | Disposition: A | Discharge status: Home / Self Care | Payer: BC Managed Care – PPO | Source: Ambulatory Visit | Attending: Obstetrics and Gynecology | Admitting: Obstetrics and Gynecology

## 2022-01-10 DIAGNOSIS — Z1231 Encounter for screening mammogram for malignant neoplasm of breast: Secondary | ICD-10-CM | POA: Diagnosis not present

## 2022-02-04 ENCOUNTER — Ambulatory Visit: Payer: BC Managed Care – PPO | Admitting: Neurology

## 2022-02-04 NOTE — Progress Notes (Signed)
NEUROLOGY FOLLOW UP OFFICE NOTE  Cassandra Pham [id number]  Assessment/Plan:   Migraine without aura, without status migrainosus, not intractable    Migraine prevention:  Increase nortriptyline to 50mg  at bedtime.  Increase to 100mg  at bedtime in 4 weeks if needed Migraine rescue: Excedrin Migraine.  May discontinue rizatriptan as it is not more efficacious  Limit use of pain relievers (rizatriptan, Excedrin) to no more than 2 days out of week to prevent risk of rebound or medication-overuse headache. Keep headache diary Follow up 5 months       Subjective:  Cassandra Pham is a 46 year old right-handed female with asthma who follows up for migraines.  UPDATE: Started nortriptyline.  Now taking 25mg  at bedtime. Intensity:  2-5/10 if treats immediately, otherwise 10/10 if severe Duration:  2 hours Frequency:  Once a week, severe occurring every other week Current NSAIDS/analgesics:  Excedrin Migraine Current triptans:  rizatriptan 10mg  Current ergotamine:  none Current anti-emetic:  none Current muscle relaxants:  none Current Antihypertensive medications:  none Current Antidepressant medications:  nortriptyline 25mg  QHS, sertraline Current Anticonvulsant medications:  none Current anti-CGRP:  none Current Vitamins/Herbal/Supplements:  none Current Antihistamines/Decongestants:  none Other therapy:  none Hormone/birth control:  none  Caffeine:  1 cup coffee daily Alcohol:  occasional Smoker:  no Diet:  Does not drink much water during winter.  Drinks Sprite.  Skips meals Exercise:  no Depression/Anxiety:  yes Other pain:  no Sleep hygiene:  varies  HISTORY:  Onset:  Off an on for several years.  More consistent around 46 years old, worse over past couple of months.  Got new prescription a couple of months ago and now vision is off Location:  both temples Quality:  pressure/sharp/pounding Intensity:  2-5/10 if treats immediately, otherwise 10/10 if severe Aura:   absent Prodrome:  absent Associated symptoms:  Photophobia, phonophobia.  Usually no nausea but rarely.  She denies associated vomiting, visual disturbance, unilateral numbness or weakness. Duration:  2 hours.  Recently had one lasting a day. Frequency:  Once a week, severe occurring every other week.  Frequency of abortive medication: 2-3 days a week Triggers:  stress, hormone Relieving factors:  Excedrin Migraine, sleep Activity:  If severe, cannot function      Past NSAIDS/analgesics:  diclofenac 75mg , acetaminophen, ibuprofen 800mg , Toradol shot (helps) Past abortive triptans:  sumatriptan tab Past abortive ergotamine:  none Past muscle relaxants:  cyclobenzaprine, tizanidine Past anti-emetic:  ondansetron 4mg , promethazine Past antihypertensive medications:  none Past antidepressant medications:  none Past anticonvulsant medications:  none Past anti-CGRP:  none Past vitamins/Herbal/Supplements:  B12, D Past antihistamines/decongestants:  none Other past therapies:  none    Family history of headache:  no  PAST MEDICAL HISTORY: Past Medical History:  Diagnosis Date   Anxiety and depression    Asthma    Bacterial vaginosis    BRCA negative 02/2020   MyRisk neg except NTHL1 VUS; IBIS=7.4%/riskscore=7.2%   Family history of ovarian cancer 02/2019   MyRisk testing declined   Family history of pancreatic cancer    Medical history non-contributory    Migraine    UTI (urinary tract infection)     MEDICATIONS: Current Outpatient Medications on File Prior to Visit  Medication Sig Dispense Refill   acetaminophen (TYLENOL) 500 MG tablet Take 500 mg by mouth every 6 (six) hours as needed. (Patient not taking: Reported on 09/27/2021)     aspirin-acetaminophen-caffeine (EXCEDRIN MIGRAINE) 250-250-65 MG tablet Take by mouth every 6 (six) hours as  needed for headache. (Patient not taking: Reported on 09/27/2021)     ibuprofen (ADVIL) 800 MG tablet Take 1 tablet (800 mg total) by  mouth every 8 (eight) hours as needed. (Patient not taking: Reported on 09/27/2021) 30 tablet 0   nortriptyline (PAMELOR) 25 MG capsule Take 1 capsule (25 mg total) by mouth at bedtime. 30 capsule 5   ofloxacin (OCUFLOX) 0.3 % ophthalmic solution INSTILL 2 DROPS INTO EACH EYE 4 TIMES DAILY FOR 5 DAYS 5 mL 0   rizatriptan (MAXALT) 10 MG tablet Take 1 tablet (10 mg total) by mouth as needed for migraine (may repeat after 2 hours.  Maximum 2 tablets in 24 hours.). May repeat in 2 hours if needed (Patient not taking: Reported on 09/27/2021) 10 tablet 5   trimethoprim-polymyxin b (POLYTRIM) ophthalmic solution Place 1 drop into both eyes every 4 (four) hours while awake. 10 mL 0   No current facility-administered medications on file prior to visit.    ALLERGIES: Allergies  Allergen Reactions   Ginger Hives   Amoxil [Amoxicillin] Hives    FAMILY HISTORY: Family History  Problem Relation Age of Onset   Hypertension Mother    Diabetes Father        DM Type 2   Hypercholesterolemia Father    Pancreatic cancer Maternal Grandmother 67   Ovarian cancer Maternal Aunt 40   Bone cancer Maternal Uncle 45   Breast cancer Cousin 36   Breast cancer Paternal Aunt 40      Objective:  Blood pressure 129/77, pulse 86, height 5' (1.524 m), weight 128 lb (58.1 kg), SpO2 95 %. General: No acute distress.  Patient appears well-groomed.   Head:  Normocephalic/atraumatic    Metta Clines, DO  CC: [name], PA-C

## 2022-02-05 ENCOUNTER — Ambulatory Visit: Payer: BC Managed Care – PPO | Admitting: Neurology

## 2022-02-06 ENCOUNTER — Encounter: Payer: Self-pay | Admitting: Neurology

## 2022-02-06 ENCOUNTER — Ambulatory Visit: Payer: BC Managed Care – PPO | Admitting: Neurology

## 2022-02-06 VITALS — BP 129/77 | HR 86 | Ht 60.0 in | Wt 128.0 lb

## 2022-02-06 DIAGNOSIS — G43009 Migraine without aura, not intractable, without status migrainosus: Secondary | ICD-10-CM | POA: Diagnosis not present

## 2022-02-06 MED ORDER — NORTRIPTYLINE HCL 50 MG PO CAPS: 50.0000 mg | ORAL_CAPSULE | Freq: Every day | ORAL | 5 refills | Status: DC

## 2022-02-06 NOTE — Patient Instructions (Addendum)
Increase nortriptyline to '50mg'$  at bedtime.  If no improvement in 4 weeks, contact me Use Excedrin Migraine as needed.  Limit use of pain relievers to no more than 2 days out of week to prevent risk of rebound or medication-overuse headache.  Discontinue rizatriptan. Keep headache diary Follow up 5 months.

## 2022-02-07 ENCOUNTER — Encounter: Payer: Self-pay | Admitting: Neurology

## 2022-02-13 ENCOUNTER — Telehealth: Payer: Self-pay | Admitting: Neurology

## 2022-02-13 MED ORDER — NORTRIPTYLINE HCL 50 MG PO CAPS
50.0000 mg | ORAL_CAPSULE | Freq: Every day | ORAL | 5 refills | Status: DC
Start: 2022-02-13 — End: 2022-07-31

## 2022-02-13 NOTE — Telephone Encounter (Signed)
Pt said her nortriptyline '50mg'$  was sent to the wrong pharmacy. She did not pick up the one sent to [address] Needs to be sent to [facility] on [address]

## 2022-03-15 ENCOUNTER — Encounter: Payer: Self-pay | Admitting: Nurse Practitioner

## 2022-03-15 ENCOUNTER — Ambulatory Visit (INDEPENDENT_AMBULATORY_CARE_PROVIDER_SITE_OTHER): Payer: Self-pay | Admitting: Nurse Practitioner

## 2022-03-15 VITALS — HR 100

## 2022-03-15 DIAGNOSIS — H6121 Impacted cerumen, right ear: Secondary | ICD-10-CM

## 2022-03-15 DIAGNOSIS — H608X1 Other otitis externa, right ear: Secondary | ICD-10-CM

## 2022-03-15 MED ORDER — NEOMYCIN-POLYMYXIN-HC 3.5-10000-1 OT SOLN: 3.0000 [drp] | Freq: Four times a day (QID) | OTIC | 0 refills | Status: AC

## 2022-03-15 MED ORDER — IBUPROFEN 800 MG PO TABS: 800.0000 mg | ORAL_TABLET | Freq: Three times a day (TID) | ORAL | 0 refills | Status: DC | PRN

## 2022-03-15 NOTE — Progress Notes (Signed)
[facility] [address], [place] [address] [phone]  Office Visit Note  Patient Name: Cassandra Pham Date of Birth [id number]  Medical Record number [id number]  Date of Service: 03/15/2022     HPI 46 year old female presenting with decreased hearing in right ear, history of recurrent cerumen impaction and otitis externa most recently 05/2021     Current Medication: Current Outpatient Medications on File Prior to Visit  Medication Sig Dispense Refill   acetaminophen (TYLENOL) 500 MG tablet Take 500 mg by mouth every 6 (six) hours as needed.     aspirin-acetaminophen-caffeine (EXCEDRIN MIGRAINE) 250-250-65 MG tablet Take by mouth every 6 (six) hours as needed for headache.     nortriptyline (PAMELOR) 50 MG capsule Take 1 capsule (50 mg total) by mouth at bedtime. 30 capsule 5   No current facility-administered medications on file prior to visit.        Medical History: Past Medical History:  Diagnosis Date   Anxiety and depression    Asthma    Bacterial vaginosis    BRCA negative 02/2020   MyRisk neg except NTHL1 VUS; IBIS=7.4%/riskscore=7.2%   Family history of ovarian cancer 02/2019   MyRisk testing declined   Family history of pancreatic cancer    Medical history non-contributory    Migraine    UTI (urinary tract infection)      Vital Signs: Today's Vitals   03/15/22 0824  Pulse: 100  SpO2: 98%   There is no height or weight on file to calculate BMI.   Review of Systems  Constitutional: Negative.   HENT:  Positive for ear pain.   Eyes: Negative.   Respiratory: Negative.    Cardiovascular: Negative.   Gastrointestinal: Negative.   Genitourinary: Negative.   Musculoskeletal: Negative.   Neurological: Negative.     Physical Exam HENT:     Head: Normocephalic.     Right Ear: Decreased hearing noted. Tenderness present.     Left Ear: Hearing, tympanic membrane, ear canal and external ear normal.     Ears:     Comments:  Moderate cerumen to right canal     Mouth/Throat:     Mouth: Mucous membranes are moist.  Eyes:     Pupils: Pupils are equal, round, and reactive to light.  Pulmonary:     Effort: Pulmonary effort is normal.  Neurological:     Mental Status: She is alert and oriented to person, place, and time. Mental status is at baseline.  Psychiatric:        Mood and Affect: Mood normal.       Assessment/Plan: 1. Other otitis externa, right ear  - neomycin-polymyxin-hydrocortisone (CORTISPORIN) OTIC solution; Place 3 drops into the right ear 4 (four) times daily for 7 days.  Dispense: 4.2 mL; Refill: 0 - ibuprofen (ADVIL) 800 MG tablet; Take 1 tablet (800 mg total) by mouth every 8 (eight) hours as needed.  Dispense: 30 tablet; Refill: 0    2. Excessive cerumen in right ear canal  - EAR CERUMEN REMOVAL    General Counseling: Keianna verbalizes understanding of the findings of todays visit and agrees with plan of treatment. I have discussed any further diagnostic evaluation that may be needed or ordered today. We also reviewed her medications today. she has been encouraged to call the office with any questions or concerns that should arise related to todays visit.    Time spent:[address] [facility] Nurse Practitioner

## 2022-05-23 DIAGNOSIS — F332 Major depressive disorder, recurrent severe without psychotic features: Secondary | ICD-10-CM | POA: Diagnosis not present

## 2022-05-31 ENCOUNTER — Ambulatory Visit (INDEPENDENT_AMBULATORY_CARE_PROVIDER_SITE_OTHER): Payer: Self-pay | Admitting: Adult Health

## 2022-05-31 ENCOUNTER — Encounter: Payer: Self-pay | Admitting: Adult Health

## 2022-05-31 VITALS — BP 118/72 | HR 85 | Temp 98.1°F | Resp 98 | Wt 132.4 lb

## 2022-05-31 DIAGNOSIS — R300 Dysuria: Secondary | ICD-10-CM

## 2022-05-31 LAB — POCT URINALYSIS DIPSTICK (MANUAL)
Leukocytes, UA: NEGATIVE
Nitrite, UA: NEGATIVE
Poct Bilirubin: NEGATIVE
Poct Blood: NEGATIVE
Poct Glucose: NORMAL mg/dL
Poct Ketones: NEGATIVE
Poct Protein: NEGATIVE mg/dL
Poct Urobilinogen: NORMAL mg/dL
Spec Grav, UA: 1.01 (ref 1.010–1.025)
pH, UA: 7 (ref 5.0–8.0)

## 2022-05-31 MED ORDER — NITROFURANTOIN MONOHYD MACRO 100 MG PO CAPS: 100.0000 mg | ORAL_CAPSULE | Freq: Two times a day (BID) | ORAL | 0 refills | Status: DC

## 2022-05-31 NOTE — Progress Notes (Signed)
[facility] [address], [place] [address]   Office Visit Note  Patient Name: Cassandra Pham Date of Birth [id number]  Medical Record number [id number]  Date of Service: 05/31/2022  Chief Complaint  Patient presents with   Urinary Tract Infection    Started yesterday. Lower back pain, using the bathroom more, burning. Urine has a smell     Urinary Tract Infection  Associated symptoms include flank pain and frequency. Pertinent negatives include no chills or hematuria.   Pt is here for acute visit. She reports yesterday she started feeling some discomfort with urination. And some low back pain. She gets a UTI about once a year and this feels similar.    Current Medication:  Outpatient Encounter Medications as of 05/31/2022  Medication Sig   nortriptyline (PAMELOR) 50 MG capsule Take 1 capsule (50 mg total) by mouth at bedtime.   acetaminophen (TYLENOL) 500 MG tablet Take 500 mg by mouth every 6 (six) hours as needed.   aspirin-acetaminophen-caffeine (EXCEDRIN MIGRAINE) 250-250-65 MG tablet Take by mouth every 6 (six) hours as needed for headache.   ibuprofen (ADVIL) 800 MG tablet Take 1 tablet (800 mg total) by mouth every 8 (eight) hours as needed.   No facility-administered encounter medications on file as of 05/31/2022.      Medical History: Past Medical History:  Diagnosis Date   Anxiety and depression    Asthma    Bacterial vaginosis    BRCA negative 02/2020   MyRisk neg except NTHL1 VUS; IBIS=7.4%/riskscore=7.2%   Family history of ovarian cancer 02/2019   MyRisk testing declined   Family history of pancreatic cancer    Medical history non-contributory    Migraine    UTI (urinary tract infection)      Vital Signs: BP 118/72 (BP Location: Left Arm, Patient Position: Sitting, Cuff Size: Normal)   Pulse 85   Temp 98.1 F (36.7 C) (Tympanic)   Resp (!) 98   Wt 132 lb 6.4 oz (60.1 kg)   BMI 25.86 kg/m    Review of Systems  Constitutional:   Negative for chills, fatigue and fever.  Genitourinary:  Positive for dysuria, flank pain and frequency. Negative for hematuria.    Physical Exam Vitals and nursing note reviewed.  Constitutional:      Appearance: Normal appearance.  Abdominal:     Tenderness: There is abdominal tenderness in the suprapubic area. There is no right CVA tenderness or left CVA tenderness.  Neurological:     Mental Status: She is alert.    Assessment/Plan: 1. Dysuria UA normal.  Discussed early UTI with patient.  Since she is leaving for [employer], will send Macrobid to pharmacy to have in case symptoms persist or worsen.  - POCT Urinalysis Dip Manual - nitrofurantoin, macrocrystal-monohydrate, (MACROBID) 100 MG capsule; Take 1 capsule (100 mg total) by mouth 2 (two) times daily.  Dispense: 14 capsule; Refill: 0     General Counseling: Anjali verbalizes understanding of the findings of todays visit and agrees with plan of treatment. I have discussed any further diagnostic evaluation that may be needed or ordered today. We also reviewed her medications today. she has been encouraged to call the office with any questions or concerns that should arise related to todays visit.   Orders Placed This Encounter  Procedures   POCT Urinalysis Dip Manual    No orders of the defined types were placed in this encounter.   Time spent:20 Minutes    Letisia Schwalb  Lemar Lofty AGNP-C Nurse Practitioner

## 2022-07-05 DIAGNOSIS — F332 Major depressive disorder, recurrent severe without psychotic features: Secondary | ICD-10-CM | POA: Diagnosis not present

## 2022-07-05 DIAGNOSIS — F411 Generalized anxiety disorder: Secondary | ICD-10-CM | POA: Diagnosis not present

## 2022-07-08 ENCOUNTER — Ambulatory Visit: Payer: BC Managed Care – PPO | Admitting: Neurology

## 2022-07-15 ENCOUNTER — Ambulatory Visit (INDEPENDENT_AMBULATORY_CARE_PROVIDER_SITE_OTHER): Payer: Self-pay | Admitting: Physician Assistant

## 2022-07-15 ENCOUNTER — Encounter: Payer: Self-pay | Admitting: Physician Assistant

## 2022-07-15 VITALS — BP 118/80 | HR 106 | Temp 97.6°F

## 2022-07-15 DIAGNOSIS — L509 Urticaria, unspecified: Secondary | ICD-10-CM

## 2022-07-15 MED ORDER — HYDROXYZINE HCL 10 MG PO TABS: 10.0000 mg | ORAL_TABLET | Freq: Three times a day (TID) | ORAL | 0 refills | Status: DC | PRN

## 2022-07-15 MED ORDER — METHYLPREDNISOLONE 4 MG PO TBPK: ORAL_TABLET | ORAL | 0 refills | Status: DC

## 2022-07-15 NOTE — Progress Notes (Signed)
[facility] [address],  [address]   Office Visit Note  Patient Name: Cassandra Pham Date of Birth [id number]  Medical Record number [id number]  Date of Service: 07/15/2022  Chief Complaint  Patient presents with   Urticaria    Started Sat. Hands, face, and body. Was told the last rime this happened it was from stress and anxiety. Itches. Not painful. Pt stated she doesn't feel stressed.       47 y/o F presents to the clinic for c/o itching and welts two days ago. She denies changes in diet. No changes in hygiene products ie soaps, body wash, shampoo, laundry detergent, etc. 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 She hasn't had ginger in her diet. She was also told that this could be related to "anxiety and hidden stress". She doesn't feel stressed or anxious.    Urticaria      Current Medication:  Outpatient Encounter Medications as of 07/15/2022  Medication Sig   hydrOXYzine (ATARAX) 10 MG tablet Take 1 tablet (10 mg total) by mouth 3 (three) times daily as needed.   methylPREDNISolone (MEDROL DOSEPAK) 4 MG TBPK tablet Take as directed   nortriptyline (PAMELOR) 50 MG capsule Take 1 capsule (50 mg total) by mouth at bedtime.   acetaminophen (TYLENOL) 500 MG tablet Take 500 mg by mouth every 6 (six) hours as needed. (Patient not taking: Reported on 07/15/2022)   aspirin-acetaminophen-caffeine (EXCEDRIN MIGRAINE) 250-250-65 MG tablet Take by mouth every 6 (six) hours as needed for headache.   ibuprofen (ADVIL) 800 MG tablet Take 1 tablet (800 mg total) by mouth every 8 (eight) hours as needed.   nitrofurantoin, macrocrystal-monohydrate, (MACROBID) 100 MG capsule Take 1 capsule (100 mg total) by mouth 2 (two) times daily. (Patient not taking: Reported on 07/15/2022)   No facility-administered encounter medications on file as of 07/15/2022.      Medical History: Past Medical History:   Diagnosis Date   Anxiety and depression    Asthma    Bacterial vaginosis    BRCA negative 02/2020   MyRisk neg except NTHL1 VUS; IBIS=7.4%/riskscore=7.2%   Family history of ovarian cancer 02/2019   MyRisk testing declined   Family history of pancreatic cancer    Medical history non-contributory    Migraine    UTI (urinary tract infection)      Vital Signs: BP 118/80 (BP Location: Left Arm, Patient Position: Sitting, Cuff Size: Normal)   Pulse (!) 106   Temp 97.6 F (36.4 C) (Tympanic)   SpO2 99%    Review of Systems  Constitutional: Negative.   Skin:  Positive for rash.  Allergic/Immunologic: Positive for food allergies.    Physical Exam Constitutional:      Appearance: Normal appearance.  HENT:     Head: Normocephalic and atraumatic.     Right Ear: External ear normal.     Left Ear: External ear normal.  Eyes:     Extraocular Movements: Extraocular movements intact.  Skin:    General: Skin is warm and dry.     Findings: Rash present. Rash is macular and urticarial.       Neurological:     Mental Status: She is alert and oriented to person, place, and time.  Psychiatric:        Mood and Affect: Mood normal.        Behavior: Behavior normal.  Assessment/Plan:  1. Urticaria - methylPREDNISolone (MEDROL DOSEPAK) 4 MG TBPK tablet; Take as directed  Dispense: 1 each; Refill: 0 - hydrOXYzine (ATARAX) 10 MG tablet; Take 1 tablet (10 mg total) by mouth 3 (three) times daily as needed.  Dispense: 30 tablet; Refill: 0  Apply cold compresses Take luke warm showers. Apply Sarna cream  Take otc Zyrtec for itching Consider taking Hydroxyzine and Prednisone if itching persist.  Continue to watch for worsening symptoms. Pt verbalized understanding and in agreement.   General Counseling: shenee ganesan understanding of the findings of todays visit and agrees with plan of treatment. I have discussed any further diagnostic evaluation that may be needed or  ordered today. We also reviewed her medications today. she has been encouraged to call the office with any questions or concerns that should arise related to todays visit.    Time spent:20 [place], [place] Physician Assistant

## 2022-07-21 ENCOUNTER — Encounter (HOSPITAL_COMMUNITY): Payer: Self-pay

## 2022-07-21 ENCOUNTER — Ambulatory Visit (HOSPITAL_COMMUNITY)
Admission: EM | Admit: 2022-07-21 | Discharge: 2022-07-21 | Disposition: A | Discharge status: Home / Self Care | Payer: BC Managed Care – PPO | Attending: Physician Assistant | Admitting: Physician Assistant

## 2022-07-21 DIAGNOSIS — L299 Pruritus, unspecified: Secondary | ICD-10-CM

## 2022-07-21 DIAGNOSIS — L509 Urticaria, unspecified: Secondary | ICD-10-CM

## 2022-07-21 MED ORDER — LORATADINE 10 MG PO TABS: 10.0000 mg | ORAL_TABLET | Freq: Every day | ORAL | 0 refills | Status: DC

## 2022-07-21 MED ORDER — METHYLPREDNISOLONE SODIUM SUCC 125 MG IJ SOLR
INTRAMUSCULAR | Status: AC
  Filled 2022-07-21: qty 2

## 2022-07-21 MED ORDER — METHYLPREDNISOLONE SODIUM SUCC 125 MG IJ SOLR
60.0000 mg | Freq: Once | INTRAMUSCULAR | Status: AC
  Administered 2022-07-21: 60 mg via INTRAMUSCULAR

## 2022-07-21 MED ORDER — FAMOTIDINE 20 MG PO TABS: 20.0000 mg | ORAL_TABLET | Freq: Two times a day (BID) | ORAL | 0 refills | Status: DC

## 2022-07-21 NOTE — Discharge Instructions (Signed)
Internal referral has been made to [facility] for evaluation of hives and itching, their office should be calling you within the next 48 hours to make an appointment for evaluation.  Advised to continue to take the hydroxyzine 10 mg 3-4 times a day to help decrease itching. Advised take Claritin 10 mg once daily to help decrease itching. Advised take Pepcid 20 mg twice daily to help decrease hives and itching. You may still use Benadryl in combination with these medicines to help decrease the hive reaction.  Advised to follow-up PCP or to the emergency room if symptoms fail to improve or worsen.

## 2022-07-21 NOTE — ED Triage Notes (Signed)
Patient reports that she has had hives x 1 week all over. Patient states she feels like something is stuck in her throat. Patient is able to speak in complete sentences and is able to swallow her own saliva.

## 2022-07-21 NOTE — ED Provider Notes (Signed)
[place]    CSN: [id number] Arrival date & time: 07/21/22  1541      History   Chief Complaint Chief Complaint  Patient presents with   Urticaria    HPI Cassandra Pham is a 47 y.o. female.   47 year old female presents with scratchy throat.  Patient indicates for the past week she has been having hives breaking out on the arms, legs, chest and back.  She indicates that she was seen by provider and given prescription for hydroxyzine and steroid taper.  She indicates she started this on Monday and she has completed the medication however she continues to have intermittent hives occur.  She indicates that she has not changed her diet, no new foods or liquids, no new medications, soaps or detergents.  She indicates she has not been exposed to any chemicals that she is aware of.  She is without fever or chills.  She indicates she is not wheezing or having shortness of breath.  She does indicate that over the past 1 to 2 days she has felt like that her throat is gotten scratchy sensation that "something stuck in the throat".  She indicates she is able to drink fluids and eat without any problem swallowing.  She does relate that she had this occur 1 time before when she first moved to the area.  At that time she was advised that the hives were due to anxiety and nerves.  Indicates she is not under any increased stress over the past couple weeks.  Patient indicates she does not have a rash at the present time but she feels like the hives will break out at any time, she continues to have intense itching all over.   Urticaria    Past Medical History:  Diagnosis Date   Anxiety and depression    Asthma    Bacterial vaginosis    BRCA negative 02/2020   MyRisk neg except NTHL1 VUS; IBIS=7.4%/riskscore=7.2%   Family history of ovarian cancer 02/2019   MyRisk testing declined   Family history of pancreatic cancer    Medical history non-contributory    Migraine    UTI (urinary  tract infection)     Patient Active Problem List   Diagnosis Date Noted   Family history of pancreatic cancer 03/08/2020   Numbness and tingling in left arm 04/08/2018   Acute pain of left shoulder 01/30/2018   Chronic migraine without aura without status migrainosus, not intractable 01/30/2018   Asthma 01/30/2018   Anxiety and depression 01/30/2018   Anxiety and depression 01/30/2018   Asthma 01/30/2018   Chronic migraine without aura without status migrainosus, not intractable 01/30/2018   Family history of ovarian cancer 02/25/2017   Gout 10/02/2016    Past Surgical History:  Procedure Laterality Date   ABLATION     APPENDECTOMY     ESOPHAGOGASTRODUODENOSCOPY (EGD) WITH PROPOFOL N/A 10/05/2015   Procedure: ESOPHAGOGASTRODUODENOSCOPY (EGD) WITH PROPOFOL;  Surgeon: Hulen Luster, MD;  Location: [facility] ENDOSCOPY;  Service: Gastroenterology;  Laterality: N/A;   TUBAL LIGATION      OB History     Gravida  5   Para  4   Term  4   Preterm      AB  1   Living  4      SAB      IAB  1   Ectopic      Multiple      Live Births  4  Home Medications    Prior to Admission medications   Medication Sig Start Date End Date Taking? Authorizing Provider  famotidine (PEPCID) 20 MG tablet Take 1 tablet (20 mg total) by mouth 2 (two) times daily. 07/21/22  Yes Nyoka Lint, PA-C  loratadine (CLARITIN) 10 MG tablet Take 1 tablet (10 mg total) by mouth daily. 07/21/22  Yes Nyoka Lint, PA-C  acetaminophen (TYLENOL) 500 MG tablet Take 500 mg by mouth every 6 (six) hours as needed. Patient not taking: Reported on 07/15/2022    [provider]  aspirin-acetaminophen-caffeine (EXCEDRIN MIGRAINE) [phone] MG tablet Take by mouth every 6 (six) hours as needed for headache.    [provider]  hydrOXYzine (ATARAX) 10 MG tablet Take 1 tablet (10 mg total) by mouth 3 (three) times daily as needed. 07/15/22   Johnna Acosta, PA-C  ibuprofen (ADVIL) 800 MG  tablet Take 1 tablet (800 mg total) by mouth every 8 (eight) hours as needed. 03/15/22   Apolonio Schneiders, FNP  methylPREDNISolone (MEDROL DOSEPAK) 4 MG TBPK tablet Take as directed 07/15/22   Johnna Acosta, PA-C  nitrofurantoin, macrocrystal-monohydrate, (MACROBID) 100 MG capsule Take 1 capsule (100 mg total) by mouth 2 (two) times daily. Patient not taking: Reported on 07/15/2022 05/31/22   Kendell Bane, NP  nortriptyline (PAMELOR) 50 MG capsule Take 1 capsule (50 mg total) by mouth at bedtime. 02/13/22   Pieter Partridge, DO    Family History Family History  Problem Relation Age of Onset   Hypertension Mother    Diabetes Father        DM Type 2   Hypercholesterolemia Father    Pancreatic cancer Maternal Grandmother 24   Ovarian cancer Maternal Aunt 40   Bone cancer Maternal Uncle 70   Breast cancer Cousin 73   Breast cancer Paternal Aunt 39    Social History Social History   Tobacco Use   Smoking status: Never   Smokeless tobacco: Never  Vaping Use   Vaping Use: Never used  Substance Use Topics   Alcohol use: Yes    Comment: occ.   Drug use: No     Allergies   Ginger and Amoxil [amoxicillin]   Review of Systems Review of Systems  Skin:  Positive for rash (itching and hives).     Physical Exam Triage Vital Signs ED Triage Vitals  Enc Vitals Group     BP 07/21/22 1557 (!) 133/91     Pulse Rate 07/21/22 1557 94     Resp 07/21/22 1557 16     Temp 07/21/22 1557 97.8 F (36.6 C)     Temp Source 07/21/22 1557 Oral     SpO2 --      Weight --      Height --      Head Circumference --      Peak Flow --      Pain Score 07/21/22 1558 0     Pain Loc --      Pain Edu? --      Excl. in [place]? --    No data found.  Updated Vital Signs BP (!) 133/91 (BP Location: Left Arm)   Pulse 94   Temp 97.8 F (36.6 C) (Oral)   Resp 16   Visual Acuity Right Eye Distance:   Left Eye Distance:   Bilateral Distance:    Right Eye Near:   Left Eye Near:    Bilateral Near:      Physical Exam Constitutional:  Appearance: Normal appearance.  HENT:     Right Ear: Tympanic membrane and ear canal normal.     Left Ear: Tympanic membrane and ear canal normal.     Mouth/Throat:     Mouth: Mucous membranes are moist.     Pharynx: Oropharynx is clear. Uvula midline.     Comments: Throat: No evidence of oropharyngeal swelling. Neck:     Thyroid: No thyroid mass, thyromegaly or thyroid tenderness.  Cardiovascular:     Rate and Rhythm: Normal rate and regular rhythm.     Heart sounds: Normal heart sounds.  Pulmonary:     Effort: Pulmonary effort is normal.     Breath sounds: Normal breath sounds and air entry. No wheezing, rhonchi or rales.  Lymphadenopathy:     Cervical: No cervical adenopathy.  Skin:    Comments: Skin: No visible rash is present arms, back, abdomen, neck and face.  Neurological:     Mental Status: She is alert.      UC Treatments / Results  Labs (all labs ordered are listed, but only abnormal results are displayed) Labs Reviewed - No data to display  EKG   Radiology No results found.  Procedures Procedures (including critical care time)  Medications Ordered in UC Medications  methylPREDNISolone sodium succinate (SOLU-MEDROL) 125 mg/2 mL injection 60 mg (60 mg Intramuscular Given 07/21/22 1616)    Initial Impression / Assessment and Plan / UC Course  I have reviewed the triage vital signs and the nursing notes.  Pertinent labs & imaging results that were available during my care of the patient were reviewed by me and considered in my medical decision making (see chart for details).    Plan: The diagnosis will be treated with the following: 1.  Hives: A.  Solu-Medrol 60 mg IM given today in the office. 2.  Itching: A.  Pepcid 20 mg twice daily to help decrease itching and hives: B.  Claritin 10 mg daily to help decrease itching and hives. C.  Advised to continue hydroxyzine 10 mg 3-4 times a day to help decrease itching  and hives. D.  Advised to continue taking Benadryl 25 mg 3-4 times a day to help decrease itching and hives. 3.  Internal referral made to [facility] dermatology for evaluation of hives. 4.  Patient advised follow-up PCP or to the emergency room if symptoms fail to improve or if symptoms increase and is followed with difficulty swallowing, swelling of the tongue, wheezing or shortness of breath. Final Clinical Impressions(s) / UC Diagnoses   Final diagnoses:  Hives  Itching     Discharge Instructions      Internal referral has been made to [facility] for evaluation of hives and itching, their office should be calling you within the next 48 hours to make an appointment for evaluation.  Advised to continue to take the hydroxyzine 10 mg 3-4 times a day to help decrease itching. Advised take Claritin 10 mg once daily to help decrease itching. Advised take Pepcid 20 mg twice daily to help decrease hives and itching. You may still use Benadryl in combination with these medicines to help decrease the hive reaction.  Advised to follow-up PCP or to the emergency room if symptoms fail to improve or worsen.    ED Prescriptions     Medication Sig Dispense Auth. Provider   famotidine (PEPCID) 20 MG tablet Take 1 tablet (20 mg total) by mouth 2 (two) times daily. 30 tablet Nyoka Lint, PA-C   loratadine (  CLARITIN) 10 MG tablet Take 1 tablet (10 mg total) by mouth daily. 20 tablet Nyoka Lint, PA-C      PDMP not reviewed this encounter.   Nyoka Lint, PA-C 07/21/22 1624

## 2022-07-23 ENCOUNTER — Ambulatory Visit (INDEPENDENT_AMBULATORY_CARE_PROVIDER_SITE_OTHER): Payer: BC Managed Care – PPO | Admitting: Dermatology

## 2022-07-23 VITALS — BP 113/75 | HR 93

## 2022-07-23 DIAGNOSIS — Z79899 Other long term (current) drug therapy: Secondary | ICD-10-CM | POA: Diagnosis not present

## 2022-07-23 DIAGNOSIS — L509 Urticaria, unspecified: Secondary | ICD-10-CM

## 2022-07-23 DIAGNOSIS — Z7189 Other specified counseling: Secondary | ICD-10-CM

## 2022-07-23 MED ORDER — LORATADINE 10 MG PO TABS: 40.0000 mg | ORAL_TABLET | ORAL | 3 refills | Status: DC

## 2022-07-23 NOTE — Progress Notes (Signed)
   New Patient Visit  Subjective  Cassandra Pham is a 47 y.o. female who presents for the following: Urticaria (Body, ~ 1 week, Claritin, Hydroxyzine, Famotidine, finished prednisone taper and was given Solu-Medrol 60 mg IM 07/21/22 at Urgent care).  New patient referral from Nyoka Lint, PA-C.  The following portions of the chart were reviewed this encounter and updated as appropriate:   Tobacco  Allergies  Meds  Problems  Med Hx  Surg Hx  Fam Hx     Review of Systems:  No other skin or systemic complaints except as noted in HPI or Assessment and Plan.  Objective  Well appearing patient in no apparent distress; mood and affect are within normal limits.  A focused examination was performed including face, arms, legs. Relevant physical exam findings are noted in the Assessment and Plan.  Left Forearm - Anterior Dermatographism arms, urticarial plaques arms   Assessment & Plan  Urticaria - Severe Resistent to multiple systemic combination treatments Generalized  urticaria or hives is a pink to red patchy whelp- like rash of the skin that typically itches and it is the result of histamine release in the skin.   Hives may have multiple causes including stress, medications, infections, and systemic illness.  Sometimes there is a family history of chronic urticaria.   "Physical urticarias" may be caused by heat, sun, cold, vibration.   Insect bites can cause "papular urticaria". It is often difficult to find the cause of generalized hives.  Statistically, 70% of the time a cause of generalized hives is not found.  Sometimes hives can spontaneously resolve. Other times hives can persist and when it does, and no cause is found, and it has been at least 6 weeks since started, it is called "chronic idiopathic urticaria".  Cont Famotidine '20mg'$  bid as prescribed by Nyoka Lint, PA-C Increase Claritin to 4 po qd, 2 in the morning and 2 in the afternoon Change Hydroxyzine to take  Hydroxyzine '10mg'$  1 po at bedtime Pending approval start Xolair sq injections q month (prescription faxed to [facility])  loratadine (CLARITIN) 10 MG tablet - Left Forearm - Anterior Take 4 tablets (40 mg total) by mouth as directed. Take 2 po in the morning and 2 po in the afternoon  Related Medications methylPREDNISolone (MEDROL DOSEPAK) 4 MG TBPK tablet Take as directed  hydrOXYzine (ATARAX) 10 MG tablet Take 1 tablet (10 mg total) by mouth 3 (three) times daily as needed.   Return in about 1 week (around 07/30/2022).  I, Cassandra Pham, RMA, am acting as scribe for Cassandra Ser, MD . Documentation: I have reviewed the above documentation for accuracy and completeness, and I agree with the above.  Cassandra Ser, MD

## 2022-07-23 NOTE — Patient Instructions (Addendum)
Continue Famotidine '20mg'$  2 times a day as prescribed by Nyoka Lint, PA-C Increase Claritin to 4 pills a day during day, 2 in the morning, 2 in the afternoon Change Hydroxyzine to take Hydroxyzine '10mg'$  1-2 pills at bedtime, may make drowsey Pending approval start Xolair injections    Due to recent changes in healthcare laws, you may see results of your pathology and/or laboratory studies on MyChart before the doctors have had a chance to review them. We understand that in some cases there may be results that are confusing or concerning to you. Please understand that not all results are received at the same time and often the doctors may need to interpret multiple results in order to provide you with the best plan of care or course of treatment. Therefore, we ask that you please give [place] 2 business days to thoroughly review all your results before contacting the office for clarification. Should we see a critical lab result, you will be contacted sooner.   If You Need Anything After Your Visit  If you have any questions or concerns for your doctor, please call our main line at [phone] and press option 4 to reach your doctor's medical assistant. If no one answers, please leave a voicemail as directed and we will return your call as soon as possible. Messages left after 4 pm will be answered the following business day.   You may also send [place] a message via [place]. We typically respond to MyChart messages within 1-2 business days.  For prescription refills, please ask your pharmacy to contact our office. Our fax number is [phone].  If you have an urgent issue when the clinic is closed that cannot wait until the next business day, you can page your doctor at the number below.    Please note that while we do our best to be available for urgent issues outside of office hours, we are not available 24/7.   If you have an urgent issue and are unable to reach [place], you may choose to seek medical  care at your doctor's office, retail clinic, urgent care center, or emergency room.  If you have a medical emergency, please immediately call 911 or go to the emergency department.  Pager Numbers  - Dr. Nehemiah Massed: [phone]  - Dr. Laurence Ferrari: [phone]  - Dr. Nicole Kindred: [phone]  In the event of inclement weather, please call our main line at [phone] for an update on the status of any delays or closures.  Dermatology Medication Tips: Please keep the boxes that topical medications come in in order to help keep track of the instructions about where and how to use these. Pharmacies typically print the medication instructions only on the boxes and not directly on the medication tubes.   If your medication is too expensive, please contact our office at [phone] option 4 or send [place] a message through [place].   We are unable to tell what your co-pay for medications will be in advance as this is different depending on your insurance coverage. However, we may be able to find a substitute medication at lower cost or fill out paperwork to get insurance to cover a needed medication.   If a prior authorization is required to get your medication covered by your insurance company, please allow [place] 1-2 business days to complete this process.  Drug prices often vary depending on where the prescription is filled and some pharmacies may offer cheaper prices.  The website www.goodrx.com contains coupons for medications through different  pharmacies. The prices here do not account for what the cost may be with help from insurance (it may be cheaper with your insurance), but the website can give you the price if you did not use any insurance.  - You can print the associated coupon and take it with your prescription to the pharmacy.  - You may also stop by our office during regular business hours and pick up a GoodRx coupon card.  - If you need your prescription sent electronically to a different  pharmacy, notify our office through [facility] or by phone at [phone] option 4.     Si Usted Necesita Algo Despus de Su Visita  Tambin puede enviarnos un mensaje a travs de Pharmacist, community. Por lo general respondemos a los mensajes de MyChart en el transcurso de 1 a 2 das hbiles.  Para renovar recetas, por favor pida a su farmacia que se ponga en contacto con nuestra oficina. Harland Dingwall de fax es [place] [phone].  Si tiene un asunto urgente cuando la clnica est cerrada y que no puede esperar hasta el siguiente da hbil, puede llamar/localizar a su doctor(a) al nmero que aparece a continuacin.   Por favor, tenga en cuenta que aunque hacemos todo lo posible para estar disponibles para asuntos urgentes fuera del horario de [place], no estamos disponibles las 24 horas del da, los 7 das de la [place].   Si tiene un problema urgente y no puede comunicarse con nosotros, puede optar por buscar atencin mdica  en el consultorio de su doctor(a), en una clnica privada, en un centro de atencin urgente o en una sala de emergencias.  Si tiene Engineering geologist, por favor llame inmediatamente al 911 o vaya a la sala de emergencias.  Nmeros de bper  - Dr. Nehemiah Massed: [phone]  - Dra. Moye: [phone]  - Dra. Nicole Kindred: [phone]  En caso de inclemencias del [place], por favor llame a Johnsie Kindred principal al [phone] para una actualizacin sobre el [place] de cualquier retraso o cierre.  Consejos para la medicacin en dermatologa: Por favor, guarde las cajas en las que vienen los medicamentos de uso tpico para ayudarle a seguir las instrucciones sobre dnde y cmo usarlos. Las farmacias generalmente imprimen las instrucciones del medicamento slo en las cajas y no directamente en los tubos del [place].   Si su medicamento es muy caro, por favor, pngase en contacto con Zigmund Daniel llamando al [phone] y presione la opcin 4 o envenos un mensaje a  travs de Pharmacist, community.   No podemos decirle cul ser su copago por los medicamentos por adelantado ya que esto es diferente dependiendo de la cobertura de su seguro. Sin embargo, es posible que podamos encontrar un medicamento sustituto a Electrical engineer un formulario para que el seguro cubra el medicamento que se considera necesario.   Si se requiere una autorizacin previa para que su compaa de seguros [place] su medicamento, por favor permtanos de 1 a 2 das hbiles para completar este proceso.  Los precios de los medicamentos varan con frecuencia dependiendo del Environmental consultant de dnde se surte la receta y alguna farmacias pueden ofrecer precios ms baratos.  El sitio web www.goodrx.com tiene cupones para medicamentos de Airline pilot. Los precios aqu no tienen en cuenta lo que podra costar con la ayuda del seguro (puede ser ms barato con su seguro), pero el sitio web puede darle el precio si no utiliz Research scientist (physical sciences).  - Puede imprimir el cupn correspondiente y llevarlo con su  receta a la farmacia.  - Tambin puede pasar por nuestra oficina durante el horario de atencin regular y Charity fundraiser una tarjeta de cupones de GoodRx.  - Si necesita que su receta se enve electrnicamente a una farmacia diferente, informe a nuestra oficina a travs de MyChart de [place] o por telfono llamando al [phone] y presione la opcin 4.

## 2022-07-29 ENCOUNTER — Ambulatory Visit: Payer: BC Managed Care – PPO | Admitting: Neurology

## 2022-07-30 ENCOUNTER — Encounter: Payer: Self-pay | Admitting: Dermatology

## 2022-07-30 ENCOUNTER — Telehealth: Payer: Self-pay

## 2022-07-30 MED ORDER — OMALIZUMAB 150 MG/ML ~~LOC~~ SOSY: 300.0000 mg | PREFILLED_SYRINGE | SUBCUTANEOUS | 5 refills | Status: DC

## 2022-07-30 NOTE — Telephone Encounter (Signed)
Xolair sent to [facility] (P [phone]) per pharmacy benefits. Xolair PA sent through Cover My Meds. aw

## 2022-07-30 NOTE — Progress Notes (Unsigned)
NEUROLOGY FOLLOW UP OFFICE NOTE  Cassandra Pham [id number]  Assessment/Plan:   Migraine without aura, without status migrainosus, not intractable    Migraine prevention:  nortriptyline '50mg'$  at bedtime *** Migraine rescue: Excedrin Migraine. *** Limit use of pain relievers (rizatriptan, Excedrin) to no more than 2 days out of week to prevent risk of rebound or medication-overuse headache. Keep headache diary Follow up 5 months ***       Subjective:  Cassandra Pham is a 47 year old right-handed female with asthma who follows up for migraines.   UPDATE: Increased nortriptyline. Intensity:  2-5/10 if treats immediately, otherwise 10/10 if severe Duration:  2 hours Frequency:  Once a week, severe occurring every other week Current NSAIDS/analgesics:  Excedrin Migraine Current triptans:  none Current ergotamine:  none Current anti-emetic:  none Current muscle relaxants:  none Current Antihypertensive medications:  none Current Antidepressant medications:  nortriptyline '50mg'$  QHS, sertraline Current Anticonvulsant medications:  none Current anti-CGRP:  none Current Vitamins/Herbal/Supplements:  none Current Antihistamines/Decongestants:  none Other therapy:  none Hormone/birth control:  none   Caffeine:  1 cup coffee daily Alcohol:  occasional Smoker:  no Diet:  Does not drink much water during winter.  Drinks Sprite.  Skips meals Exercise:  no Depression/Anxiety:  yes Other pain:  no Sleep hygiene:  varies   HISTORY:  Onset:  Off an on for several years.  More consistent around 47 years old, worse over past couple of months.  Got new prescription a couple of months ago and now vision is off Location:  both temples Quality:  pressure/sharp/pounding Intensity:  2-5/10 if treats immediately, otherwise 10/10 if severe Aura:  absent Prodrome:  absent Associated symptoms:  Photophobia, phonophobia.  Usually no nausea but rarely.  She denies associated vomiting, visual  disturbance, unilateral numbness or weakness. Duration:  2 hours.  Recently had one lasting a day. Frequency:  Once a week, severe occurring every other week.  Frequency of abortive medication: 2-3 days a week Triggers:  stress, hormone Relieving factors:  Excedrin Migraine, sleep Activity:  If severe, cannot function       Past NSAIDS/analgesics:  diclofenac '75mg'$ , acetaminophen, ibuprofen '800mg'$ , Toradol shot (helps) Past abortive triptans:  sumatriptan tab, rizatriptan  Past abortive ergotamine:  none Past muscle relaxants:  cyclobenzaprine, tizanidine Past anti-emetic:  ondansetron '4mg'$ , promethazine Past antihypertensive medications:  none Past antidepressant medications:  none Past anticonvulsant medications:  none Past anti-CGRP:  none Past vitamins/Herbal/Supplements:  B12, D Past antihistamines/decongestants:  none Other past therapies:  none     Family history of headache:  no  PAST MEDICAL HISTORY: Past Medical History:  Diagnosis Date   Anxiety and depression    Asthma    Bacterial vaginosis    BRCA negative 02/2020   MyRisk neg except NTHL1 VUS; IBIS=7.4%/riskscore=7.2%   Family history of ovarian cancer 02/2019   MyRisk testing declined   Family history of pancreatic cancer    Medical history non-contributory    Migraine    UTI (urinary tract infection)     MEDICATIONS: Current Outpatient Medications on File Prior to Visit  Medication Sig Dispense Refill   acetaminophen (TYLENOL) 500 MG tablet Take 500 mg by mouth every 6 (six) hours as needed. (Patient not taking: Reported on 07/15/2022)     aspirin-acetaminophen-caffeine (EXCEDRIN MIGRAINE) 250-250-65 MG tablet Take by mouth every 6 (six) hours as needed for headache.     famotidine (PEPCID) 20 MG tablet Take 1 tablet (20 mg total) by mouth 2 (  two) times daily. 30 tablet 0   hydrOXYzine (ATARAX) 10 MG tablet Take 1 tablet (10 mg total) by mouth 3 (three) times daily as needed. 30 tablet 0   ibuprofen  (ADVIL) 800 MG tablet Take 1 tablet (800 mg total) by mouth every 8 (eight) hours as needed. 30 tablet 0   loratadine (CLARITIN) 10 MG tablet Take 1 tablet (10 mg total) by mouth daily. 20 tablet 0   loratadine (CLARITIN) 10 MG tablet Take 4 tablets (40 mg total) by mouth as directed. Take 2 po in the morning and 2 po in the afternoon 120 tablet 3   methylPREDNISolone (MEDROL DOSEPAK) 4 MG TBPK tablet Take as directed 1 each 0   nitrofurantoin, macrocrystal-monohydrate, (MACROBID) 100 MG capsule Take 1 capsule (100 mg total) by mouth 2 (two) times daily. (Patient not taking: Reported on 07/15/2022) 14 capsule 0   nortriptyline (PAMELOR) 50 MG capsule Take 1 capsule (50 mg total) by mouth at bedtime. 30 capsule 5   No current facility-administered medications on file prior to visit.    ALLERGIES: Allergies  Allergen Reactions   Ginger Hives   Amoxil [Amoxicillin] Hives    FAMILY HISTORY: Family History  Problem Relation Age of Onset   Hypertension Mother    Diabetes Father        DM Type 2   Hypercholesterolemia Father    Pancreatic cancer Maternal Grandmother 46   Ovarian cancer Maternal Aunt 40   Bone cancer Maternal Uncle 50   Breast cancer Cousin 40   Breast cancer Paternal Aunt 40      Objective:  *** General: No acute distress.  Patient appears ***-groomed.   Head:  Normocephalic/atraumatic Eyes:  Fundi examined but not visualized Neck: supple, no paraspinal tenderness, full range of motion Heart:  Regular rate and rhythm Lungs:  Clear to auscultation bilaterally Back: No paraspinal tenderness Neurological Exam: alert and oriented to person, place, and time.  Speech fluent and not dysarthric, language intact.  CN II-XII intact. Bulk and tone normal, muscle strength 5/5 throughout.  Sensation to light touch intact.  Deep tendon reflexes 2+ throughout, toes downgoing.  Finger to nose testing intact.  Gait normal, Romberg negative.   Metta Clines, DO  CC:  ***

## 2022-07-31 ENCOUNTER — Encounter: Payer: Self-pay | Admitting: Neurology

## 2022-07-31 ENCOUNTER — Ambulatory Visit (INDEPENDENT_AMBULATORY_CARE_PROVIDER_SITE_OTHER): Payer: BC Managed Care – PPO | Admitting: Dermatology

## 2022-07-31 ENCOUNTER — Ambulatory Visit (INDEPENDENT_AMBULATORY_CARE_PROVIDER_SITE_OTHER): Payer: BC Managed Care – PPO | Admitting: Neurology

## 2022-07-31 VITALS — BP 117/83 | HR 98

## 2022-07-31 VITALS — BP 122/83 | HR 99 | Ht <= 58 in | Wt 135.0 lb

## 2022-07-31 DIAGNOSIS — L509 Urticaria, unspecified: Secondary | ICD-10-CM | POA: Diagnosis not present

## 2022-07-31 DIAGNOSIS — Z79899 Other long term (current) drug therapy: Secondary | ICD-10-CM

## 2022-07-31 DIAGNOSIS — G43009 Migraine without aura, not intractable, without status migrainosus: Secondary | ICD-10-CM | POA: Diagnosis not present

## 2022-07-31 MED ORDER — HYDROXYZINE HCL 10 MG PO TABS: 10.0000 mg | ORAL_TABLET | ORAL | 1 refills | Status: DC

## 2022-07-31 MED ORDER — OMALIZUMAB 150 MG/ML ~~LOC~~ SOSY
150.0000 mg | PREFILLED_SYRINGE | Freq: Once | SUBCUTANEOUS | Status: AC
  Administered 2022-07-31: 150 mg via SUBCUTANEOUS

## 2022-07-31 MED ORDER — FAMOTIDINE 20 MG PO TABS: 20.0000 mg | ORAL_TABLET | Freq: Two times a day (BID) | ORAL | 1 refills | Status: DC

## 2022-07-31 MED ORDER — NORTRIPTYLINE HCL 50 MG PO CAPS: 50.0000 mg | ORAL_CAPSULE | Freq: Every day | ORAL | 2 refills | Status: DC

## 2022-07-31 NOTE — Progress Notes (Signed)
   Follow-Up Visit   Subjective  Cassandra Pham is a 47 y.o. female who presents for the following: Urticaria (Body, itchy, no improvement, Famotidine '20mg'$  bid, Claritin 2 po qd, Hydroxyzine '10mg'$  2-3x/d).  The following portions of the chart were reviewed this encounter and updated as appropriate:   Tobacco  Allergies  Meds  Problems  Med Hx  Surg Hx  Fam Hx     Review of Systems:  No other skin or systemic complaints except as noted in HPI or Assessment and Plan.  Objective  Well appearing patient in no apparent distress; mood and affect are within normal limits.  A focused examination was performed including left leg. Relevant physical exam findings are noted in the Assessment and Plan.  trunk, extremities Photo shows diffuse redness   Assessment & Plan  Urticaria trunk, extremities Chronic and persistent condition with duration or expected duration over one year. Condition is symptomatic / bothersome to patient. Not to goal. Urticaria or hives is a pink to red patchy whelp- like rash of the skin that typically itches and it is the result of histamine release in the skin.   Hives may have multiple causes including stress, medications, infections, and systemic illness.  Sometimes there is a family history of chronic urticaria.   "Physical urticarias" may be caused by heat, sun, cold, vibration.   Insect bites can cause "papular urticaria". It is often difficult to find the cause of generalized hives.  Statistically, 70% of the time a cause of generalized hives is not found.  Sometimes hives can spontaneously resolve. Other times hives can persist and when it does, and no cause is found, and it has been at least 6 weeks since started, it is called "chronic idiopathic urticaria".  Labs from 06/2021 viewed  Cont Famotidine '20mg'$  bid as prescribed by Nyoka Lint, PA-C Increase Claritin to 4 po qd, 2 in the morning and 2 in the afternoon Change Hydroxyzine to take Hydroxyzine  '10mg'$  1 po at bedtime Start Xolair '150mg'$ /ml x 2 sq injections today Xolair '150mg'$ /ml x 2 sq injections to R and L upper arm, samples x 2 Lot [id number], exp 12/2022  BP before Xolair - 117/83 BP right after Xolair - 12:50 110/81 BP 15 minutes after Xolair - 1:05 114/83 BP 30 minutes after Xolair - 1:20 125/81  Discussed Allergy referral, discussed with patients recent oral prednisone taper and Prednisone IM injection we will need to wait on referral until prednisone out of system  omalizumab Arvid Right) prefilled syringe 150 mg - trunk, extremities omalizumab Arvid Right) prefilled syringe 150 mg - trunk, extremities  famotidine (PEPCID) 20 MG tablet - trunk, extremities Take 1 tablet (20 mg total) by mouth 2 (two) times daily. Related Medications loratadine (CLARITIN) 10 MG tablet Take 4 tablets (40 mg total) by mouth as directed. Take 2 po in the morning and 2 po in the afternoon hydrOXYzine (ATARAX) 10 MG tablet Take 1 tablet (10 mg total) by mouth as directed. Take 1-2 po qhs as needed for itching, may make drowsy  Return in about 1 month (around 08/31/2022) for Urticaria.  I, [name], RMA, am acting as scribe for Sarina Ser, MD . Documentation: I have reviewed the above documentation for accuracy and completeness, and I agree with the above.  Sarina Ser, MD

## 2022-07-31 NOTE — Patient Instructions (Signed)
Continue Famotidine '20mg'$  2 times a day as prescribed by Nyoka Lint, PA-C Increase Claritin to 4 pills a day during day, 2 in the morning, 2 in the afternoon Change Hydroxyzine to take Hydroxyzine '10mg'$  1-2 pills at bedtime, may make drowsey Pending approval start Xolair injections      Due to recent changes in healthcare laws, you may see results of your pathology and/or laboratory studies on MyChart before the doctors have had a chance to review them. We understand that in some cases there may be results that are confusing or concerning to you. Please understand that not all results are received at the same time and often the doctors may need to interpret multiple results in order to provide you with the best plan of care or course of treatment. Therefore, we ask that you please give [place] 2 business days to thoroughly review all your results before contacting the office for clarification. Should we see a critical lab result, you will be contacted sooner.   If You Need Anything After Your Visit  If you have any questions or concerns for your doctor, please call our main line at [phone] and press option 4 to reach your doctor's medical assistant. If no one answers, please leave a voicemail as directed and we will return your call as soon as possible. Messages left after 4 pm will be answered the following business day.   You may also send [place] a message via [place]. We typically respond to MyChart messages within 1-2 business days.  For prescription refills, please ask your pharmacy to contact our office. Our fax number is [phone].  If you have an urgent issue when the clinic is closed that cannot wait until the next business day, you can page your doctor at the number below.    Please note that while we do our best to be available for urgent issues outside of office hours, we are not available 24/7.   If you have an urgent issue and are unable to reach [place], you may choose to seek medical  care at your doctor's office, retail clinic, urgent care center, or emergency room.  If you have a medical emergency, please immediately call 911 or go to the emergency department.  Pager Numbers  - Dr. Nehemiah Massed: [phone]  - Dr. Laurence Ferrari: [phone]  - Dr. Nicole Kindred: [phone]  In the event of inclement weather, please call our main line at [phone] for an update on the status of any delays or closures.  Dermatology Medication Tips: Please keep the boxes that topical medications come in in order to help keep track of the instructions about where and how to use these. Pharmacies typically print the medication instructions only on the boxes and not directly on the medication tubes.   If your medication is too expensive, please contact our office at [phone] option 4 or send [place] a message through [place].   We are unable to tell what your co-pay for medications will be in advance as this is different depending on your insurance coverage. However, we may be able to find a substitute medication at lower cost or fill out paperwork to get insurance to cover a needed medication.   If a prior authorization is required to get your medication covered by your insurance company, please allow [place] 1-2 business days to complete this process.  Drug prices often vary depending on where the prescription is filled and some pharmacies may offer cheaper prices.  The website www.goodrx.com contains coupons for medications  through different pharmacies. The prices here do not account for what the cost may be with help from insurance (it may be cheaper with your insurance), but the website can give you the price if you did not use any insurance.  - You can print the associated coupon and take it with your prescription to the pharmacy.  - You may also stop by our office during regular business hours and pick up a GoodRx coupon card.  - If you need your prescription sent electronically to a different  pharmacy, notify our office through [facility] or by phone at [phone] option 4.     Si Usted Necesita Algo Despus de Su Visita  Tambin puede enviarnos un mensaje a travs de Pharmacist, community. Por lo general respondemos a los mensajes de MyChart en el transcurso de 1 a 2 das hbiles.  Para renovar recetas, por favor pida a su farmacia que se ponga en contacto con nuestra oficina. Harland Dingwall de fax es [place] [phone].  Si tiene un asunto urgente cuando la clnica est cerrada y que no puede esperar hasta el siguiente da hbil, puede llamar/localizar a su doctor(a) al nmero que aparece a continuacin.   Por favor, tenga en cuenta que aunque hacemos todo lo posible para estar disponibles para asuntos urgentes fuera del horario de [place], no estamos disponibles las 24 horas del da, los 7 das de la [place].   Si tiene un problema urgente y no puede comunicarse con nosotros, puede optar por buscar atencin mdica  en el consultorio de su doctor(a), en una clnica privada, en un centro de atencin urgente o en una sala de emergencias.  Si tiene Engineering geologist, por favor llame inmediatamente al 911 o vaya a la sala de emergencias.  Nmeros de bper  - Dr. Nehemiah Massed: [phone]  - Dra. Moye: [phone]  - Dra. Nicole Kindred: [phone]  En caso de inclemencias del [place], por favor llame a Johnsie Kindred principal al [phone] para una actualizacin sobre el [place] de cualquier retraso o cierre.  Consejos para la medicacin en dermatologa: Por favor, guarde las cajas en las que vienen los medicamentos de uso tpico para ayudarle a seguir las instrucciones sobre dnde y cmo usarlos. Las farmacias generalmente imprimen las instrucciones del medicamento slo en las cajas y no directamente en los tubos del [place].   Si su medicamento es muy caro, por favor, pngase en contacto con Zigmund Daniel llamando al [phone] y presione la opcin 4 o envenos un mensaje a  travs de Pharmacist, community.   No podemos decirle cul ser su copago por los medicamentos por adelantado ya que esto es diferente dependiendo de la cobertura de su seguro. Sin embargo, es posible que podamos encontrar un medicamento sustituto a Electrical engineer un formulario para que el seguro cubra el medicamento que se considera necesario.   Si se requiere una autorizacin previa para que su compaa de seguros [place] su medicamento, por favor permtanos de 1 a 2 das hbiles para completar este proceso.  Los precios de los medicamentos varan con frecuencia dependiendo del Environmental consultant de dnde se surte la receta y alguna farmacias pueden ofrecer precios ms baratos.  El sitio web www.goodrx.com tiene cupones para medicamentos de Airline pilot. Los precios aqu no tienen en cuenta lo que podra costar con la ayuda del seguro (puede ser ms barato con su seguro), pero el sitio web puede darle el precio si no utiliz Research scientist (physical sciences).  - Puede imprimir el cupn correspondiente y llevarlo  con su receta a la farmacia.  - Tambin puede pasar por nuestra oficina durante el horario de atencin regular y Charity fundraiser una tarjeta de cupones de GoodRx.  - Si necesita que su receta se enve electrnicamente a una farmacia diferente, informe a nuestra oficina a travs de MyChart de [place] o por telfono llamando al [phone] y presione la opcin 4.

## 2022-07-31 NOTE — Patient Instructions (Signed)
Nortriptyline '50mg'$  at bedtime Limit use of pain relievers to no more than 2 days out of week to prevent risk of rebound or medication-overuse headache. Follow up 9 months

## 2022-08-03 ENCOUNTER — Encounter: Payer: Self-pay | Admitting: Dermatology

## 2022-08-22 DIAGNOSIS — F411 Generalized anxiety disorder: Secondary | ICD-10-CM | POA: Diagnosis not present

## 2022-08-29 ENCOUNTER — Ambulatory Visit (INDEPENDENT_AMBULATORY_CARE_PROVIDER_SITE_OTHER): Payer: Self-pay | Admitting: Adult Health

## 2022-08-29 VITALS — BP 134/81 | HR 91 | Temp 98.6°F | Resp 14 | Wt 136.2 lb

## 2022-08-29 DIAGNOSIS — N3000 Acute cystitis without hematuria: Secondary | ICD-10-CM

## 2022-08-29 DIAGNOSIS — R300 Dysuria: Secondary | ICD-10-CM

## 2022-08-29 LAB — POCT URINALYSIS DIPSTICK
Bilirubin, UA: NEGATIVE
Blood, UA: NEGATIVE
Glucose, UA: NEGATIVE
Ketones, UA: NEGATIVE
Nitrite, UA: NEGATIVE
Protein, UA: NEGATIVE
Spec Grav, UA: 1.01 (ref 1.010–1.025)
Urobilinogen, UA: 0.2 E.U./dL
pH, UA: 7 (ref 5.0–8.0)

## 2022-08-29 MED ORDER — IBUPROFEN 800 MG PO TABS: 800.0000 mg | ORAL_TABLET | Freq: Three times a day (TID) | ORAL | 0 refills | Status: DC | PRN

## 2022-08-29 MED ORDER — NITROFURANTOIN MONOHYD MACRO 100 MG PO CAPS: 100.0000 mg | ORAL_CAPSULE | Freq: Two times a day (BID) | ORAL | 0 refills | Status: DC

## 2022-08-29 NOTE — Progress Notes (Signed)
[facility] [address], [place] [address]   Office Visit Note  Patient Name: Cassandra Pham Date of Birth [id number]  Medical Record number [id number]  Date of Service: 08/29/2022  Chief Complaint  Patient presents with   Urinary Tract Infection    Pressure and burning with urination     Urinary Tract Infection  Pertinent negatives include no chills.   Pt is here for a sick visit. She reports pressure and burning with urination. She did have sex multiple times in one day with her partner about a week ago.     Current Medication:  Outpatient Encounter Medications as of 08/29/2022  Medication Sig   acetaminophen (TYLENOL) 500 MG tablet Take 500 mg by mouth every 6 (six) hours as needed.   aspirin-acetaminophen-caffeine (EXCEDRIN MIGRAINE) 250-250-65 MG tablet Take by mouth every 6 (six) hours as needed for headache.   famotidine (PEPCID) 20 MG tablet Take 1 tablet (20 mg total) by mouth 2 (two) times daily.   hydrOXYzine (ATARAX) 10 MG tablet Take 1 tablet (10 mg total) by mouth as directed. Take 1-2 po qhs as needed for itching, may make drowsy   ibuprofen (ADVIL) 800 MG tablet Take 1 tablet (800 mg total) by mouth every 8 (eight) hours as needed.   loratadine (CLARITIN) 10 MG tablet Take 1 tablet (10 mg total) by mouth daily.   nortriptyline (PAMELOR) 50 MG capsule Take 1 capsule (50 mg total) by mouth at bedtime.   omalizumab Arvid Right) 150 MG/ML prefilled syringe Inject 300 mg into the skin every 28 (twenty-eight) days.   [DISCONTINUED] loratadine (CLARITIN) 10 MG tablet Take 4 tablets (40 mg total) by mouth as directed. Take 2 po in the morning and 2 po in the afternoon   No facility-administered encounter medications on file as of 08/29/2022.      Medical History: Past Medical History:  Diagnosis Date   Anxiety and depression    Asthma    Bacterial vaginosis    BRCA negative 02/2020   MyRisk neg except NTHL1 VUS; IBIS=7.4%/riskscore=7.2%   Family  history of ovarian cancer 02/2019   MyRisk testing declined   Family history of pancreatic cancer    Medical history non-contributory    Migraine    UTI (urinary tract infection)      Vital Signs: BP 134/81   Pulse 91   Temp 98.6 F (37 C) (Oral)   Resp 14   Wt 136 lb 3.2 oz (61.8 kg)   SpO2 100%   BMI 28.47 kg/m    Review of Systems  Constitutional:  Negative for chills, fatigue and fever.  Gastrointestinal:  Negative for abdominal pain.  Musculoskeletal:  Positive for back pain.    Physical Exam Vitals and nursing note reviewed.  Constitutional:      Appearance: Normal appearance.  Abdominal:     Tenderness: There is no abdominal tenderness. There is no right CVA tenderness or left CVA tenderness.  Neurological:     Mental Status: She is alert.    Results for orders placed or performed in visit on 08/29/22 (from the past 24 hour(s))  POCT Urinalysis Dipstick (CPT 81002)     Status: Abnormal   Collection Time: 08/29/22  1:13 PM  Result Value Ref Range   Color, UA Yellow    Clarity, UA Clear    Glucose, UA Negative Negative   Bilirubin, UA Neg    Ketones, UA Neg    Spec Grav, UA 1.010 1.010 - 1.025  Blood, UA Neg    pH, UA 7.0 5.0 - 8.0   Protein, UA Negative Negative   Urobilinogen, UA 0.2 0.2 or 1.0 E.U./dL   Nitrite, UA Neg    Leukocytes, UA Trace (A) Negative   Appearance     Odor      Assessment/Plan: 1. Acute cystitis without hematuria Take Nitrofurantoin every 12 hours (twice a day) with food x 7d; Finish all antibiotics. Drink plenty of water. Avoid or limit alcohol and caffeine, which may make symptoms worse. You may take an over-the-counter pain reliever (i.e., AZO urinary pain relief, Tylenol) as needed for pain next 1-2 days. Send secure message to provider or schedule return appointment as needed for new/worsening symptoms (such as fever or abdominal pain) if your symptoms are not improving after 2-3 days taking antibiotics or if your  symptoms do not completely resolve following antibiotics.   2. Burning with urination - POCT Urinalysis Dipstick (CPT 81002) - nitrofurantoin, macrocrystal-monohydrate, (MACROBID) 100 MG capsule; Take 1 capsule (100 mg total) by mouth 2 (two) times daily.  Dispense: 14 capsule; Refill: 0 - ibuprofen (ADVIL) 800 MG tablet; Take 1 tablet (800 mg total) by mouth every 8 (eight) hours as needed.  Dispense: 30 tablet; Refill: 0     General Counseling: Rocklyn verbalizes understanding of the findings of todays visit and agrees with plan of treatment. I have discussed any further diagnostic evaluation that may be needed or ordered today. We also reviewed her medications today. she has been encouraged to call the office with any questions or concerns that should arise related to todays visit.   Orders Placed This Encounter  Procedures   POCT Urinalysis Dipstick (CPT 81002)    No orders of the defined types were placed in this encounter.   Time spent:15 Minutes    Kendell Bane AGNP-C Nurse Practitioner

## 2022-08-30 ENCOUNTER — Other Ambulatory Visit (HOSPITAL_COMMUNITY)
Admission: RE | Admit: 2022-08-30 | Discharge: 2022-08-30 | Disposition: A | Discharge status: Home / Self Care | Payer: BC Managed Care – PPO | Source: Ambulatory Visit | Attending: Obstetrics and Gynecology | Admitting: Obstetrics and Gynecology

## 2022-08-30 ENCOUNTER — Ambulatory Visit (INDEPENDENT_AMBULATORY_CARE_PROVIDER_SITE_OTHER): Payer: BC Managed Care – PPO

## 2022-08-30 VITALS — BP 120/80 | Ht 60.0 in | Wt 131.0 lb

## 2022-08-30 DIAGNOSIS — N898 Other specified noninflammatory disorders of vagina: Secondary | ICD-10-CM | POA: Insufficient documentation

## 2022-08-30 DIAGNOSIS — N760 Acute vaginitis: Secondary | ICD-10-CM | POA: Diagnosis not present

## 2022-08-30 DIAGNOSIS — B9689 Other specified bacterial agents as the cause of diseases classified elsewhere: Secondary | ICD-10-CM | POA: Diagnosis not present

## 2022-08-30 MED ORDER — METRONIDAZOLE 0.75 % VA GEL: 1.0000 | Freq: Every day | VAGINAL | 0 refills | Status: AC

## 2022-08-30 NOTE — Patient Instructions (Signed)

## 2022-08-30 NOTE — Progress Notes (Signed)
    NURSE VISIT NOTE  Subjective:    Patient ID: Cassandra Pham, female    DOB: [birth year], 47 y.o.   MRN: [id number]  HPI  Patient is a 47 y.o. [id number] female who presents for clear and white vaginal discharge for 3 day(s) with a fishy odor. Denies abnormal vaginal bleeding or significant pelvic pain or fever. Pt reports some dysuria. Seen pcp yesterday they said she has leucocytes in urine.  Patient denies history of known exposure to STD.   Objective:    BP 120/80   Ht 5' (1.524 m)   Wt 131 lb (59.4 kg)   BMI 25.58 kg/m    @THIS  VISIT ONLY@  Assessment:   1. Vaginal discharge   2. BV (bacterial vaginosis)   3. Vaginal odor       Plan:   GC and chlamydia DNA  probe sent to lab. Treatment: MetroGel daily x 5 days and abstain from coitus during course of treatment ROV prn if symptoms persist or worsen. Pt request to start Metrogel. Denied metronidazole pills because its her birthday month.    Cassandra Pham, CMA

## 2022-09-02 ENCOUNTER — Ambulatory Visit (INDEPENDENT_AMBULATORY_CARE_PROVIDER_SITE_OTHER): Payer: BC Managed Care – PPO | Admitting: Dermatology

## 2022-09-02 ENCOUNTER — Telehealth: Payer: Self-pay

## 2022-09-02 ENCOUNTER — Encounter: Payer: Self-pay | Admitting: Dermatology

## 2022-09-02 VITALS — BP 117/81

## 2022-09-02 DIAGNOSIS — L509 Urticaria, unspecified: Secondary | ICD-10-CM

## 2022-09-02 DIAGNOSIS — Z79899 Other long term (current) drug therapy: Secondary | ICD-10-CM | POA: Diagnosis not present

## 2022-09-02 DIAGNOSIS — L501 Idiopathic urticaria: Secondary | ICD-10-CM

## 2022-09-02 LAB — CERVICOVAGINAL ANCILLARY ONLY
Bacterial Vaginitis (gardnerella): POSITIVE — AB
Candida Glabrata: NEGATIVE
Candida Vaginitis: NEGATIVE
Chlamydia: NEGATIVE
Comment: NEGATIVE
Comment: NEGATIVE
Comment: NEGATIVE
Comment: NEGATIVE
Comment: NEGATIVE
Comment: NORMAL
Neisseria Gonorrhea: NEGATIVE
Trichomonas: NEGATIVE

## 2022-09-02 MED ORDER — OMALIZUMAB 150 MG/ML ~~LOC~~ SOSY
300.0000 mg | PREFILLED_SYRINGE | Freq: Once | SUBCUTANEOUS | Status: AC
Start: 2022-09-02 — End: 2022-09-02
  Administered 2022-09-02: 300 mg via SUBCUTANEOUS

## 2022-09-02 NOTE — Progress Notes (Unsigned)
Follow-Up Visit   Subjective  Cassandra Pham is a 47 y.o. female who presents for the following: urticaria. Patient currently on Xolair (had 1st injection 1 month ago), famotidine 20 mg, Clartin 2 po qd and 2 po qhs, hydroxyzine 10 mg. Patient advises the hydroxyzine helps with the itching but it makes her extremely sleepy and has to take it by 3 in the afternoon otherwise she is sleepy the next day at work.  Pt is very frustrated that she does not see improvement with her condition. Patient currently with rash at left popliteal.   The following portions of the chart were reviewed this encounter and updated as appropriate: medications, allergies, medical history  Review of Systems:  No other skin or systemic complaints except as noted in HPI or Assessment and Plan.  Objective  Well appearing patient in no apparent distress; mood and affect are within normal limits. A focused examination was performed of the following areas: Arms, legs Relevant exam findings are noted in the Assessment and Plan.  trunk, extremities Dermatographism    Assessment & Plan   Urticaria Chronic Idiopathic Urticaria trunk, extremities Chronic and persistent condition with duration or expected duration over one year. Condition is symptomatic / bothersome to patient. Not to goal, despite oral antihistamines max dosing and Xolair shots (one dosing session 4 weeks ago)  Urticaria or hives is a pink to red patchy whelp- like rash of the skin that typically itches and it is the result of histamine release in the skin.   Hives may have multiple causes including stress, medications, infections, and systemic illness.  Sometimes there is a family history of chronic urticaria.   "Physical urticarias" may be caused by heat, sun, cold, vibration.   Insect bites can cause "papular urticaria". It is often difficult to find the cause of generalized hives.  Statistically, 70% of the time a cause of generalized hives is not  found.  Sometimes hives can spontaneously resolve. Other times hives can persist and when it does, and no cause is found, and it has been at least 6 weeks since started, it is called "chronic idiopathic urticaria". Antihistamines are the mainstay for treatment.  In severe cases Xolair injections may be used.  Discussed referring to allergist but that they would probably like patient to be off of antihistamines and Xolair.  Will have patient continue Xolair injections and antihistamines today, order labs and send referral to allergist. Patient advised pending lab results, consider increasing Xolair dose.  (Hives may be related to + culture for Bacterial Vaginitis (gardnerella)  Recommend when patient takes the hydroxyzine in the evening she should work from home the following day. If she is working in the office the next day, she should take it earlier but give herself enough time to get home before getting drowsy.   BP before Xolair - 130/93 BP right after Xolair - 4:17 131/88 BP 15 minutes after Xolair - 4:32 117/86 BP 30 minutes after Xolair -  117/81  Cont Famotidine 20mg  bid as prescribed by Ellsworth Lennox, PA-C Cont Claritin to 4 po qd, 2 in the morning and 2 in the afternoon Cont Hydroxyzine to take Hydroxyzine 10mg  1 po at bedtime Continue Xolair 150mg /ml x 2 sq injections today Xolair 150mg /ml x 2 sq injections to R and L upper arm, samples x 2 Lot [id number], exp 12/2022  Related Procedures IGE CBC with Differential/Platelets CMP  Related Medications hydrOXYzine (ATARAX) 10 MG tablet Take 1 tablet (10 mg total) by mouth  as directed. Take 1-2 po qhs as needed for itching, may make drowsy  famotidine (PEPCID) 20 MG tablet Take 1 tablet (20 mg total) by mouth 2 (two) times daily.  omalizumab Geoffry Paradise) prefilled syringe 300 mg  Return in about 1 month (around 10/02/2022) for Xolair.  Anise Salvo, RMA, am acting as scribe for [name], MD .  Documentation: I have  reviewed the above documentation for accuracy and completeness, and I agree with the above.  [name], MD

## 2022-09-02 NOTE — Patient Instructions (Signed)
Due to recent changes in healthcare laws, you may see results of your pathology and/or laboratory studies on MyChart before the doctors have had a chance to review them. We understand that in some cases there may be results that are confusing or concerning to you. Please understand that not all results are received at the same time and often the doctors may need to interpret multiple results in order to provide you with the best plan of care or course of treatment. Therefore, we ask that you please give us 2 business days to thoroughly review all your results before contacting the office for clarification. Should we see a critical lab result, you will be contacted sooner.   If You Need Anything After Your Visit  If you have any questions or concerns for your doctor, please call our main line at [phone] and press option 4 to reach your doctor's medical assistant. If no one answers, please leave a voicemail as directed and we will return your call as soon as possible. Messages left after 4 pm will be answered the following business day.   You may also send us a message via MyChart. We typically respond to MyChart messages within 1-2 business days.  For prescription refills, please ask your pharmacy to contact our office. Our fax number is [phone].  If you have an urgent issue when the clinic is closed that cannot wait until the next business day, you can page your doctor at the number below.    Please note that while we do our best to be available for urgent issues outside of office hours, we are not available 24/7.   If you have an urgent issue and are unable to reach us, you may choose to seek medical care at your doctor's office, retail clinic, urgent care center, or emergency room.  If you have a medical emergency, please immediately call 911 or go to the emergency department.  Pager Numbers  - Dr. Kowalski: [phone]  - Dr. Moye: [phone]  - Dr. Stewart:  [phone]  In the event of inclement weather, please call our main line at [phone] for an update on the status of any delays or closures.  Dermatology Medication Tips: Please keep the boxes that topical medications come in in order to help keep track of the instructions about where and how to use these. Pharmacies typically print the medication instructions only on the boxes and not directly on the medication tubes.   If your medication is too expensive, please contact our office at [phone] option 4 or send us a message through MyChart.   We are unable to tell what your co-pay for medications will be in advance as this is different depending on your insurance coverage. However, we may be able to find a substitute medication at lower cost or fill out paperwork to get insurance to cover a needed medication.   If a prior authorization is required to get your medication covered by your insurance company, please allow us 1-2 business days to complete this process.  Drug prices often vary depending on where the prescription is filled and some pharmacies may offer cheaper prices.  The website www.goodrx.com contains coupons for medications through different pharmacies. The prices here do not account for what the cost may be with help from insurance (it may be cheaper with your insurance), but the website can give you the price if you did not use any insurance.  - You can print the associated coupon and take it with   your prescription to the pharmacy.  - You may also stop by our office during regular business hours and pick up a GoodRx coupon card.  - If you need your prescription sent electronically to a different pharmacy, notify our office through [facility] MyChart or by phone at [phone] option 4.     Si Usted Necesita Algo Despus de Su Visita  Tambin puede enviarnos un mensaje a travs de MyChart. Por lo general respondemos a los mensajes de MyChart en el transcurso de 1 a 2  das hbiles.  Para renovar recetas, por favor pida a su farmacia que se ponga en contacto con nuestra oficina. Nuestro nmero de fax es el [phone].  Si tiene un asunto urgente cuando la clnica est cerrada y que no puede esperar hasta el siguiente da hbil, puede llamar/localizar a su doctor(a) al nmero que aparece a continuacin.   Por favor, tenga en cuenta que aunque hacemos todo lo posible para estar disponibles para asuntos urgentes fuera del horario de oficina, no estamos disponibles las 24 horas del da, los 7 das de la semana.   Si tiene un problema urgente y no puede comunicarse con nosotros, puede optar por buscar atencin mdica  en el consultorio de su doctor(a), en una clnica privada, en un centro de atencin urgente o en una sala de emergencias.  Si tiene una emergencia mdica, por favor llame inmediatamente al 911 o vaya a la sala de emergencias.  Nmeros de bper  - Dr. Kowalski: [phone]  - Dra. Moye: [phone]  - Dra. Stewart: [phone]  En caso de inclemencias del tiempo, por favor llame a nuestra lnea principal al [phone] para una actualizacin sobre el estado de cualquier retraso o cierre.  Consejos para la medicacin en dermatologa: Por favor, guarde las cajas en las que vienen los medicamentos de uso tpico para ayudarle a seguir las instrucciones sobre dnde y cmo usarlos. Las farmacias generalmente imprimen las instrucciones del medicamento slo en las cajas y no directamente en los tubos del medicamento.   Si su medicamento es muy caro, por favor, pngase en contacto con nuestra oficina llamando al [phone] y presione la opcin 4 o envenos un mensaje a travs de MyChart.   No podemos decirle cul ser su copago por los medicamentos por adelantado ya que esto es diferente dependiendo de la cobertura de su seguro. Sin embargo, es posible que podamos encontrar un medicamento sustituto a menor costo o llenar un formulario para que el  seguro cubra el medicamento que se considera necesario.   Si se requiere una autorizacin previa para que su compaa de seguros cubra su medicamento, por favor permtanos de 1 a 2 das hbiles para completar este proceso.  Los precios de los medicamentos varan con frecuencia dependiendo del lugar de dnde se surte la receta y alguna farmacias pueden ofrecer precios ms baratos.  El sitio web www.goodrx.com tiene cupones para medicamentos de diferentes farmacias. Los precios aqu no tienen en cuenta lo que podra costar con la ayuda del seguro (puede ser ms barato con su seguro), pero el sitio web puede darle el precio si no utiliz ningn seguro.  - Puede imprimir el cupn correspondiente y llevarlo con su receta a la farmacia.  - Tambin puede pasar por nuestra oficina durante el horario de atencin regular y recoger una tarjeta de cupones de GoodRx.  - Si necesita que su receta se enve electrnicamente a una farmacia diferente, informe a nuestra oficina a travs de MyChart de [facility]   o por telfono llamando al [phone] y presione la opcin 4.  

## 2022-09-02 NOTE — Telephone Encounter (Signed)
Called pt to inform of positive BV results. Pt has already started metro gel.

## 2022-09-04 ENCOUNTER — Encounter: Payer: Self-pay | Admitting: Dermatology

## 2022-09-05 ENCOUNTER — Other Ambulatory Visit: Payer: Self-pay

## 2022-09-05 ENCOUNTER — Telehealth: Payer: Self-pay

## 2022-09-05 DIAGNOSIS — L509 Urticaria, unspecified: Secondary | ICD-10-CM

## 2022-09-05 NOTE — Telephone Encounter (Signed)
Advised patient that office visit note is ready to be picked up, and medical release form will need to filled out and signed.

## 2022-09-05 NOTE — Telephone Encounter (Signed)
-----   Message from Deirdre Evener, MD sent at 09/04/2022  2:37 PM EDT ----- Regarding: RE: Pt needs OV note to send to her work's HR Done.  Also send Allergist referrral . thanks ----- Message ----- From: Mickle Mallory, CMA Sent: 09/03/2022   4:49 PM EDT To: Deirdre Evener, MD Subject: Pt needs OV note to send to her work's HR      Can you let me know when you have signed note so I can print for patient as HR at her work needs it. Thank you!

## 2022-09-10 LAB — COMPREHENSIVE METABOLIC PANEL
ALT: 22 IU/L (ref 0–32)
AST: 27 IU/L (ref 0–40)
Albumin/Globulin Ratio: 1.3 (ref 1.2–2.2)
Albumin: 4 g/dL (ref 3.9–4.9)
Alkaline Phosphatase: 70 IU/L (ref 44–121)
BUN/Creatinine Ratio: 16 (ref 9–23)
BUN: 15 mg/dL (ref 6–24)
Bilirubin Total: 0.3 mg/dL (ref 0.0–1.2)
CO2: 23 mmol/L (ref 20–29)
Calcium: 9.4 mg/dL (ref 8.7–10.2)
Chloride: 103 mmol/L (ref 96–106)
Creatinine, Ser: 0.91 mg/dL (ref 0.57–1.00)
Globulin, Total: 3 g/dL (ref 1.5–4.5)
Glucose: 87 mg/dL (ref 70–99)
Potassium: 4.5 mmol/L (ref 3.5–5.2)
Sodium: 139 mmol/L (ref 134–144)
Total Protein: 7 g/dL (ref 6.0–8.5)
eGFR: 78 mL/min/{1.73_m2} (ref >59)

## 2022-09-10 LAB — CBC WITH DIFFERENTIAL/PLATELET
Basophils Absolute: 0 10*3/uL (ref 0.0–0.2)
Basos: 1 %
EOS (ABSOLUTE): 0.7 10*3/uL — ABNORMAL HIGH (ref 0.0–0.4)
Eos: 9 %
Hematocrit: 37.9 % (ref 34.0–46.6)
Hemoglobin: 12.5 g/dL (ref 11.1–15.9)
Immature Grans (Abs): 0 10*3/uL (ref 0.0–0.1)
Immature Granulocytes: 0 %
Lymphocytes Absolute: 2.7 10*3/uL (ref 0.7–3.1)
Lymphs: 36 %
MCH: 31.3 pg (ref 26.6–33.0)
MCHC: 33 g/dL (ref 31.5–35.7)
MCV: 95 fL (ref 79–97)
Monocytes Absolute: 0.5 10*3/uL (ref 0.1–0.9)
Monocytes: 7 %
Neutrophils Absolute: 3.6 10*3/uL (ref 1.4–7.0)
Neutrophils: 47 %
Platelets: 314 10*3/uL (ref 150–450)
RBC: 4 x10E6/uL (ref 3.77–5.28)
RDW: 13 % (ref 11.7–15.4)
WBC: 7.5 10*3/uL (ref 3.4–10.8)

## 2022-09-10 LAB — IGE: IgE (Immunoglobulin E), Serum: 2841 IU/mL — ABNORMAL HIGH (ref 6–495)

## 2022-09-12 ENCOUNTER — Telehealth: Payer: Self-pay

## 2022-09-12 NOTE — Telephone Encounter (Signed)
-----   Message from Deirdre Evener, MD sent at 09/10/2022 12:49 PM EDT ----- Lab from 09/05/2022 shows: Extremely high levels of IgE at 2,841 Blood counts normal except for elevated Eosinophils. Chemistries including liver and kidney are all normal.  I saw in pts chart that she was diagnosed with vaginitis with + culture for gardnerella.  She was started on Metronidazol orally in the past week. Its possible this could be cause of all her hives. Nevertheless, while she is being treated, due to her extremely elevated IgE, and her current weight around 130-#, she can increase her Xolair to  every 2 weeks. She got Xolair 300 mg last week.  She can come get another  Xolais right away if she would like, and return 2 weeks later.

## 2022-09-12 NOTE — Telephone Encounter (Signed)
I called and discussed lab results with patient. She said that the rash started Feb 19th before being diagnosed with vaginitis and being treated with Metronidazole. Do you still recommend increasing Xolair or is there anything else you recommend knowing that the hives occurred before the vaginitis and taking Metronidazole?

## 2022-09-16 NOTE — Telephone Encounter (Signed)
Left voicemail to return my call

## 2022-09-17 NOTE — Telephone Encounter (Signed)
I spoke with patient and discussed Dr. Philemon Kingdom message. Patient does not feel the vaginitis caused urticaria, because even after finishing Metronidazole gel urticaria continues to occur. Pt defers increasing Xolair at this time and prefers to wait until seeing the allergist to find the cause of urticaria first.

## 2022-09-20 DIAGNOSIS — F411 Generalized anxiety disorder: Secondary | ICD-10-CM | POA: Diagnosis not present

## 2022-09-24 ENCOUNTER — Telehealth: Payer: Self-pay

## 2022-09-24 NOTE — Telephone Encounter (Signed)
Patient's Request for medical certification has been completed from employer and ready to be picked up.  Called patient and left VM to return my call.  Left forms at front desk. Copy made to be scanned into chart. aw

## 2022-09-25 NOTE — Progress Notes (Signed)
New Patient Note  RE: Cassandra Pham MRN: [id number] DOB: 03/16/1976 Date of Office Visit: 09/26/2022  Consult requested by: Deirdre Evener, MD Primary care provider: Rica Records, PA-C  Chief Complaint: Urticaria (Breaking out in hives since 07/15/22 prednisone, Claritin, Hydroxyzine and nothing worked. Labs were all good. Xolair isn't working either.)  History of Present Illness: I had the pleasure of seeing Cassandra Pham for initial evaluation at the [facility] on 09/27/2022. She is a 47 y.o. female, who is referred here by Dr. [name] (derm) for the evaluation of urticaria. She is accompanied today by her friend who provided/contributed to the history.   Rash started on 07/15/2022. Patient started to break out during the day and she went to UC as they were the same symptoms when she broke out in hives in 2009.  At the UC she was placed on Claritin, prednisone, hydroxyzine with no improvement in symptoms.   This can occur anywhere on her body. Describes them as itchy, red, raised. Individual rashes lasts about less than 1 hour. No ecchymosis upon resolution. Associated symptoms include: none.  Frequency of episodes: daily. Suspected triggers are unknown. Denies any fevers, chills, changes in medications, foods, personal care products or recent infections. Systemic steroids: yes. Currently taking hydroxyzine 10mg  QHS, Claritin 2 pill twice a day.   Previous work up includes:  Patient saw dermatology and was started on Xolair 300mg  every 4 weeks and had 2 injections to date.   2024 bloodwork CBC diff, CMP unremarkable. IgE 2841 - this was done while she was taking Xolair.  Previous history of rash/hives: she broke out in hives 2009. She saw an allergist at that time and was told she is allergic to dogs, ginger, grass per patient report. No prior AIT. Hives resolved after a few days. She was given prednisone for this.   Patient is up to date with the  following cancer screening tests: physical exam, mammogram, pap smears.  Reviewed images - seems to have a component of dermatographism but denies scratching areas. Pictures do show linear marks.  She was also scratching her arms during the visit at times stating she was itchy.   09/02/2022 Derm visit: "Urticaria Chronic Idiopathic Urticaria trunk, extremities Chronic and persistent condition with duration or expected duration over one year. Condition is symptomatic / bothersome to patient. Not to goal, despite oral antihistamines max dosing and Xolair shots (one dosing session 4 weeks ago)   Urticaria or hives is a pink to red patchy whelp- like rash of the skin that typically itches and it is the result of histamine release in the skin.   Hives may have multiple causes including stress, medications, infections, and systemic illness.  Sometimes there is a family history of chronic urticaria.   "Physical urticarias" may be caused by heat, sun, cold, vibration.   Insect bites can cause "papular urticaria". It is often difficult to find the cause of generalized hives.  Statistically, 70% of the time a cause of generalized hives is not found.  Sometimes hives can spontaneously resolve. Other times hives can persist and when it does, and no cause is found, and it has been at least 6 weeks since started, it is called "chronic idiopathic urticaria". Antihistamines are the mainstay for treatment.  In severe cases Xolair injections may be used.   Discussed referring to allergist but that they would probably like patient to be off of antihistamines and Xolair.  Will have patient continue  Xolair injections and antihistamines today, order labs and send referral to allergist. Patient advised pending lab results, consider increasing Xolair dose.  (Hives may be related to + culture for Bacterial Vaginitis (gardnerella)   Recommend when patient takes the hydroxyzine in the evening she should work from home  the following day. If she is working in the office the next day, she should take it earlier but give herself enough time to get home before getting drowsy.    BP before Xolair - 130/93 BP right after Xolair - 4:17 131/88 BP 15 minutes after Xolair - 4:32 117/86 BP 30 minutes after Xolair -  117/81   Cont Famotidine 20mg  bid as prescribed by Cassandra Lennox, PA-C Cont Claritin to 4 po qd, 2 in the morning and 2 in the afternoon Cont Hydroxyzine to take Hydroxyzine 10mg  1 po at bedtime Continue Xolair 150mg /ml x 2 sq injections today Xolair 150mg /ml x 2 sq injections to R and L upper arm, samples x 2 Lot [id number], exp 12/2022   Related Procedures IGE CBC with Differential/Platelets CMP"  Assessment and Plan: Cassandra Pham is a 47 y.o. female with: Urticaria Started to break out on 07/15/22. Had similar hives in 2009 but that resolved after prednisone. She was prescribed Claritin, prednisone and hydroxyzine with no benefit. Saw derm and started on Xolair 300mg  every 4 weeks - had 2 injections to date with no benefit. Still breaking out daily. Reviewed images - seems to have a component of dermatographism but denies scratching areas. Pictures do show linear marks. 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 Discussed with patient, that urticaria is usually caused by release of histamine by cutaneous mast cells but sometimes it is non-histamine mediated. Explained that urticaria is not always associated with allergies. In most cases, the exact etiology for urticaria can not be established and it is considered idiopathic. Discussed that sometimes Xolair can affect skin testing results but patient would still like to proceed today as she has been off her antihistamines and been more itchy.  Today's skin prick testing was only positive to grass. Negative to select foods including garlic. Discussed that when this started happening there was no grass  pollen outdoors.  Start allegra (fexofenadine) 180mg  1 tablet twice a day. You can take up to 2 tablets twice a day. Stop loratadine and hydroxyzine.  If symptoms are not controlled or causes drowsiness let us know. Continue Pepcid (famotidine) 20mg  twice a day.  Start Singulair (montelukast) 10mg  daily at night. Cautioned that in some children/adults can experience behavioral changes including hyperactivity, agitation, depression, sleep disturbances and suicidal ideations. These side effects are rare, but if you notice them you should notify me and discontinue Singulair (montelukast). Continue Xolair 300mg  every 4 injections as per your dermatologist. Avoid the following potential triggers: alcohol, tight clothing, NSAIDs, hot showers and getting overheated. See below for proper skin care.  Get bloodwork as below.    Other allergic rhinitis Denies symptoms but apparently had allergy testing in the past which showed multiple positives.  Today's skin prick testing was only positive to grass. See below for environmental control measures. The above antihistamines should also help with these symptoms.   Other adverse food reactions, not elsewhere classified, subsequent encounter Avoiding ginger due to positive testing in the past. No clinical reactions she can recall. Today's skin prick testing was negative to ginger.  Continue to avoid for now but once hives are controlled consider reintroduction. Not ordering ginger  IgE as patient is on Xolair that can affect IgE levels.   Return in about 4 weeks (around 10/24/2022).  Meds ordered this encounter  Medications   fexofenadine (ALLEGRA ALLERGY) 180 MG tablet    Sig: Take 1-2 tablets twice a day for rash/hives/itching.    Dispense:  120 tablet    Refill:  3   montelukast (SINGULAIR) 10 MG tablet    Sig: Take 1 tablet (10 mg total) by mouth at bedtime.    Dispense:  30 tablet    Refill:  3   Lab Orders         Alpha-Gal Panel          ANA w/Reflex         CBC with Differential/Platelet         Chronic Urticaria         Comprehensive metabolic panel         C-reactive protein         Sedimentation rate         Thyroid Cascade Profile         Tryptase         C3 and C4         Protein Electrophoresis, Urine Rflx.         Protein electrophoresis, serum      Other allergy screening: Asthma:  Patient used to have an inhaler years ago. Denies any current symptoms.   Rhino conjunctivitis: no Food allergy:  currently avoiding ginger due to positive testing results in the past.  Medication allergy: yes Amoxicillin - itching. Hymenoptera allergy: no Eczema:no History of recurrent infections suggestive of immunodeficency: no  Diagnostics: Skin Testing: Environmental allergy panel and select foods. Positive to grass. Negative to select foods including garlic.  Results discussed with patient/family.  Airborne Adult Perc - 09/26/22 1429     Time Antigen Placed 1429    Allergen Manufacturer Waynette Buttery    Location Back    Number of Test 59    1. Control-Buffer 50% Glycerol Negative    2. Control-Histamine 1 mg/ml 2+    3. Albumin saline Negative    4. Bahia Negative    5. French Southern Territories Negative    6. Johnson Negative    7. Kentucky Blue Negative    8. Meadow Fescue Negative    9. Perennial Rye 2+    10. Sweet Vernal Negative    11. Timothy Negative    12. Cocklebur Negative    13. Burweed Marshelder Negative    14. Ragweed, short Negative    15. Ragweed, Giant Negative    16. Plantain,  English Negative    17. Lamb's Quarters Negative    18. Sheep Sorrell Negative    19. Rough Pigweed Negative    20. Marsh Elder, Rough Negative    21. Mugwort, Common Negative    22. Ash mix Negative    23. Birch mix Negative    24. Beech American Negative    25. Box, Elder Negative    26. Cedar, red Negative    27. Cottonwood, Guinea-Bissau Negative    28. Elm mix Negative    29. Hickory Negative    30. Maple mix Negative    31.  Oak, Guinea-Bissau mix Negative    32. Pecan Pollen Negative    33. Pine mix Negative    34. Sycamore Eastern Negative    35. Walnut, Black Pollen Negative    36. Alternaria alternata Negative    37. Cladosporium  Herbarum Negative    38. Aspergillus mix Negative    39. Penicillium mix Negative    40. Bipolaris sorokiniana (Helminthosporium) Negative    41. Drechslera spicifera (Curvularia) Negative    42. Mucor plumbeus Negative    43. Fusarium moniliforme Negative    44. Aureobasidium pullulans (pullulara) Negative    45. Rhizopus oryzae Negative    46. Botrytis cinera Negative    47. Epicoccum nigrum Negative    48. Phoma betae Negative    49. Candida Albicans Negative    50. Trichophyton mentagrophytes Negative    51. Mite, D Farinae  5,000 AU/ml Negative    52. Mite, D Pteronyssinus  5,000 AU/ml Negative    53. Cat Hair 10,000 BAU/ml Negative    54.  Dog Epithelia Negative    55. Mixed Feathers Negative    56. Horse Epithelia Negative    57. Cockroach, German Negative    58. Mouse Negative    59. Tobacco Leaf Negative             Food Adult Perc - 09/26/22 1400     Time Antigen Placed 1429    Allergen Manufacturer Waynette Buttery    Location Back    Number of allergen test 11    1. Peanut Negative    2. Soybean Negative    3. Wheat Negative    4. Sesame Negative    5. Milk, cow Negative    6. Egg White, Chicken Negative    7. Casein Negative    8. Shellfish Mix Negative    9. Fish Mix Negative    10. Cashew Negative    69. Ginger Negative             Past Medical History: Patient Active Problem List   Diagnosis Date Noted   Other allergic rhinitis 09/27/2022   Other adverse food reactions, not elsewhere classified, subsequent encounter 09/27/2022   Urticaria 09/26/2022   Family history of pancreatic cancer 03/08/2020   Numbness and tingling in left arm 04/08/2018   Acute pain of left shoulder 01/30/2018   Chronic migraine without aura without status  migrainosus, not intractable 01/30/2018   Asthma 01/30/2018   Anxiety and depression 01/30/2018   Anxiety and depression 01/30/2018   Asthma 01/30/2018   Chronic migraine without aura without status migrainosus, not intractable 01/30/2018   Family history of ovarian cancer 02/25/2017   Gout 10/02/2016   Past Medical History:  Diagnosis Date   Anxiety and depression    Asthma    Bacterial vaginosis    BRCA negative 02/2020   MyRisk neg except NTHL1 VUS; IBIS=7.4%/riskscore=7.2%   Family history of ovarian cancer 02/2019   MyRisk testing declined   Family history of pancreatic cancer    Medical history non-contributory    Migraine    Urticaria    UTI (urinary tract infection)    Past Surgical History: Past Surgical History:  Procedure Laterality Date   ABLATION     APPENDECTOMY     ESOPHAGOGASTRODUODENOSCOPY (EGD) WITH PROPOFOL N/A 10/05/2015   Procedure: ESOPHAGOGASTRODUODENOSCOPY (EGD) WITH PROPOFOL;  Surgeon: Wallace Cullens, MD;  Location: [facility] ENDOSCOPY;  Service: Gastroenterology;  Laterality: N/A;   TUBAL LIGATION     Medication List:  Current Outpatient Medications  Medication Sig Dispense Refill   aspirin-acetaminophen-caffeine (EXCEDRIN MIGRAINE) 250-250-65 MG tablet Take by mouth every 6 (six) hours as needed for headache.     famotidine (PEPCID) 20 MG tablet Take 1 tablet (20 mg total) by mouth 2 (  two) times daily. 60 tablet 1   fexofenadine (ALLEGRA ALLERGY) 180 MG tablet Take 1-2 tablets twice a day for rash/hives/itching. 120 tablet 3   ibuprofen (ADVIL) 800 MG tablet Take 1 tablet (800 mg total) by mouth every 8 (eight) hours as needed. 30 tablet 0   montelukast (SINGULAIR) 10 MG tablet Take 1 tablet (10 mg total) by mouth at bedtime. 30 tablet 3   omalizumab (XOLAIR) 150 MG/ML prefilled syringe Inject 300 mg into the skin every 28 (twenty-eight) days. 2 mL 5   No current facility-administered medications for this visit.   Allergies: Allergies  Allergen  Reactions   Ginger Hives   Amoxil [Amoxicillin] Hives   Social History: Social History   Socioeconomic History   Marital status: Single    Spouse name: Not on file   Number of children: Not on file   Years of education: Not on file   Highest education level: Not on file  Occupational History   Not on file  Tobacco Use   Smoking status: Never   Smokeless tobacco: Never  Vaping Use   Vaping Use: Never used  Substance and Sexual Activity   Alcohol use: Yes    Comment: occ.   Drug use: No   Sexual activity: Yes    Birth control/protection: Surgical    Comment: tubal ligation  Other Topics Concern   Not on file  Social History Narrative   Single.   4 children.   Works as a Public house manager.   Enjoys spending time with family and friends.       Right handed   Social Determinants of Health   Financial Resource Strain: Not on file  Food Insecurity: Not on file  Transportation Needs: Not on file  Physical Activity: Not on file  Stress: Not on file  Social Connections: Not on file   Lives in a 49 year old house. Smoking: denies Occupation: Systems developer History: Immunologist in the house: no Carpet in the family room: yes Carpet in the bedroom: yes Heating: gas Cooling: central Pet: yes 1 dog x 20 yrs.   Family History: Family History  Problem Relation Age of Onset   Hypertension Mother    Asthma Father    Allergic rhinitis Father    Diabetes Father        DM Type 2   Hypercholesterolemia Father    Ovarian cancer Maternal Aunt 40   Bone cancer Maternal Uncle 31   Breast cancer Paternal Aunt 72   Pancreatic cancer Maternal Grandmother 51   Breast cancer Cousin 40   Eczema Daughter    Urticaria Neg Hx     Review of Systems  Constitutional:  Negative for appetite change, chills, fever and unexpected weight change.  HENT:  Negative for congestion and rhinorrhea.   Eyes:  Negative for itching.  Respiratory:  Negative for cough,  chest tightness, shortness of breath and wheezing.   Cardiovascular:  Negative for chest pain.  Gastrointestinal:  Negative for abdominal pain.  Genitourinary:  Negative for difficulty urinating.  Skin:  Positive for rash.  Allergic/Immunologic: Positive for environmental allergies.  Neurological:  Negative for headaches.   Objective: BP 130/82 (BP Location: Left Arm, Patient Position: Sitting, Cuff Size: Normal)   Pulse 91   Temp 98.6 F (37 C) (Temporal)   Resp 18   Ht 5' 1.9" (1.572 m)   Wt 136 lb (61.7 kg)   SpO2 97%   BMI 24.96 kg/m  Body mass index  is 24.96 kg/m. Physical Exam Vitals and nursing note reviewed.  Constitutional:      Appearance: Normal appearance. She is well-developed.  HENT:     Head: Normocephalic and atraumatic.     Right Ear: Tympanic membrane and external ear normal.     Left Ear: Tympanic membrane and external ear normal.     Nose: Nose normal.     Mouth/Throat:     Mouth: Mucous membranes are moist.     Pharynx: Oropharynx is clear.  Eyes:     Conjunctiva/sclera: Conjunctivae normal.  Cardiovascular:     Rate and Rhythm: Normal rate and regular rhythm.     Heart sounds: Normal heart sounds. No murmur heard.    No friction rub. No gallop.  Pulmonary:     Effort: Pulmonary effort is normal.     Breath sounds: Normal breath sounds. No wheezing, rhonchi or rales.  Musculoskeletal:     Cervical back: Neck supple.  Skin:    General: Skin is warm.     Findings: No rash.  Neurological:     Mental Status: She is alert and oriented to person, place, and time.  Psychiatric:        Behavior: Behavior normal.    The plan was reviewed with the patient/family, and all questions/concerned were addressed.  It was my pleasure to see Asiana today and participate in her care. Please feel free to contact me with any questions or concerns.  Sincerely,  Wyline Mood, DO Allergy & Immunology  [facility] office:  [phone] [facility] office: [phone]

## 2022-09-26 ENCOUNTER — Encounter: Payer: Self-pay | Admitting: Allergy

## 2022-09-26 ENCOUNTER — Ambulatory Visit: Payer: BC Managed Care – PPO | Admitting: Allergy

## 2022-09-26 VITALS — BP 130/82 | HR 91 | Temp 98.6°F | Resp 18 | Ht 61.9 in | Wt 136.0 lb

## 2022-09-26 DIAGNOSIS — T781XXA Other adverse food reactions, not elsewhere classified, initial encounter: Secondary | ICD-10-CM

## 2022-09-26 DIAGNOSIS — T781XXD Other adverse food reactions, not elsewhere classified, subsequent encounter: Secondary | ICD-10-CM

## 2022-09-26 DIAGNOSIS — L299 Pruritus, unspecified: Secondary | ICD-10-CM | POA: Diagnosis not present

## 2022-09-26 DIAGNOSIS — J3089 Other allergic rhinitis: Secondary | ICD-10-CM

## 2022-09-26 DIAGNOSIS — R21 Rash and other nonspecific skin eruption: Secondary | ICD-10-CM | POA: Diagnosis not present

## 2022-09-26 DIAGNOSIS — L509 Urticaria, unspecified: Secondary | ICD-10-CM | POA: Diagnosis not present

## 2022-09-26 MED ORDER — MONTELUKAST SODIUM 10 MG PO TABS: 10.0000 mg | ORAL_TABLET | Freq: Every day | ORAL | 3 refills | Status: AC

## 2022-09-26 MED ORDER — FEXOFENADINE HCL 180 MG PO TABS: ORAL_TABLET | ORAL | 3 refills | Status: DC

## 2022-09-26 NOTE — Patient Instructions (Addendum)
Today's skin prick testing was only positive to grass. Negative to select foods including garlic.  However, you are on Xolair which can affect the skin testing results. I don't think grass is what making you break out.   Skin Based on clinical history, she likely has chronic idiopathic urticaria. Discussed with patient, that urticaria is usually caused by release of histamine by cutaneous mast cells but sometimes it is non-histamine mediated. Explained that urticaria is not always associated with allergies. In most cases, the exact etiology for urticaria can not be established and it is considered idiopathic.  Start allegra (fexofenadine) 180mg  1 tablet twice a day. You can take up to 2 tablets twice a day. Stop loratadine and hydroxyzine.  If symptoms are not controlled or causes drowsiness let us know. Continue Pepcid (famotidine) 20mg  twice a day.  Start Singulair (montelukast) 10mg  daily at night. Cautioned that in some children/adults can experience behavioral changes including hyperactivity, agitation, depression, sleep disturbances and suicidal ideations. These side effects are rare, but if you notice them you should notify me and discontinue Singulair (montelukast). Continue Xolair 300mg  every 4 injections as per your dermatologist.  Avoid the following potential triggers: alcohol, tight clothing, NSAIDs, hot showers and getting overheated. See below for proper skin care.   Get bloodwork:  We are ordering labs, so please allow 1-2 weeks for the results to come back. With the newly implemented Cures Act, the labs might be visible to you at the same time that they become visible to me. However, I will not address the results until all of the results are back, so please be patient.   Follow up in 1 months or sooner if needed with Thurston Hole or Chrissie.   Skin care recommendations  Bath time: Always use lukewarm water. AVOID very hot or cold water. Keep bathing time to 5-10 minutes. Do  NOT use bubble bath. Use a mild soap and use just enough to wash the dirty areas. Do NOT scrub skin vigorously.  After bathing, pat dry your skin with a towel. Do NOT rub or scrub the skin.  Moisturizers and prescriptions:  ALWAYS apply moisturizers immediately after bathing (within 3 minutes). This helps to lock-in moisture. Use the moisturizer several times a day over the whole body. Good summer moisturizers include: Aveeno, CeraVe, Cetaphil. Good winter moisturizers include: Aquaphor, Vaseline, Cerave, Cetaphil, Eucerin, Vanicream. When using moisturizers along with medications, the moisturizer should be applied about one hour after applying the medication to prevent diluting effect of the medication or moisturize around where you applied the medications. When not using medications, the moisturizer can be continued twice daily as maintenance.  Laundry and clothing: Avoid laundry products with added color or perfumes. Use unscented hypo-allergenic laundry products such as Tide free, Cheer free & gentle, and All free and clear.  If the skin still seems dry or sensitive, you can try double-rinsing the clothes. Avoid tight or scratchy clothing such as wool. Do not use fabric softeners or dyer sheets.  Reducing Pollen Exposure Pollen seasons: trees (spring), grass (summer) and ragweed/weeds (fall). Keep windows closed in your home and car to lower pollen exposure.  Install air conditioning in the bedroom and throughout the house if possible.  Avoid going out in dry windy days - especially early morning. Pollen counts are highest between 5 - 10 AM and on dry, hot and windy days.  Save outside activities for late afternoon or after a heavy rain, when pollen levels are lower.  Avoid mowing of grass  if you have grass pollen allergy. Be aware that pollen can also be transported indoors on people and pets.  Dry your clothes in an automatic dryer rather than hanging them outside where they might  collect pollen.  Rinse hair and eyes before bedtime

## 2022-09-27 ENCOUNTER — Encounter: Payer: Self-pay | Admitting: Allergy

## 2022-09-27 DIAGNOSIS — J3089 Other allergic rhinitis: Secondary | ICD-10-CM | POA: Insufficient documentation

## 2022-09-27 DIAGNOSIS — T781XXD Other adverse food reactions, not elsewhere classified, subsequent encounter: Secondary | ICD-10-CM | POA: Insufficient documentation

## 2022-09-27 NOTE — Assessment & Plan Note (Signed)
Denies symptoms but apparently had allergy testing in the past which showed multiple positives.  Today's skin prick testing was only positive to grass. See below for environmental control measures. The above antihistamines should also help with these symptoms.

## 2022-09-27 NOTE — Assessment & Plan Note (Signed)
Avoiding ginger due to positive testing in the past. No clinical reactions she can recall. Today's skin prick testing was negative to ginger.  Continue to avoid for now but once hives are controlled consider reintroduction. Not ordering ginger IgE as patient is on Xolair that can affect IgE levels.

## 2022-09-27 NOTE — Assessment & Plan Note (Signed)
Started to break out on 07/15/22. Had similar hives in 2009 but that resolved after prednisone. She was prescribed Claritin, prednisone and hydroxyzine with no benefit. Saw derm and started on Xolair 300mg  every 4 weeks - had 2 injections to date with no benefit. Still breaking out daily. Reviewed images - seems to have a component of dermatographism but denies scratching areas. Pictures do show linear marks. 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 Discussed with patient, that urticaria is usually caused by release of histamine by cutaneous mast cells but sometimes it is non-histamine mediated. Explained that urticaria is not always associated with allergies. In most cases, the exact etiology for urticaria can not be established and it is considered idiopathic. Discussed that sometimes Xolair can affect skin testing results but patient would still like to proceed today as she has been off her antihistamines and been more itchy.  Today's skin prick testing was only positive to grass. Negative to select foods including garlic. Discussed that when this started happening there was no grass pollen outdoors.  Start allegra (fexofenadine) 180mg  1 tablet twice a day. You can take up to 2 tablets twice a day. Stop loratadine and hydroxyzine.  If symptoms are not controlled or causes drowsiness let us know. Continue Pepcid (famotidine) 20mg  twice a day.  Start Singulair (montelukast) 10mg  daily at night. Cautioned that in some children/adults can experience behavioral changes including hyperactivity, agitation, depression, sleep disturbances and suicidal ideations. These side effects are rare, but if you notice them you should notify me and discontinue Singulair (montelukast). Continue Xolair 300mg  every 4 injections as per your dermatologist. Avoid the following potential triggers: alcohol, tight clothing, NSAIDs, hot showers and getting  overheated. See below for proper skin care.  Get bloodwork as below.

## 2022-09-30 ENCOUNTER — Encounter: Payer: Self-pay | Admitting: Allergy

## 2022-09-30 ENCOUNTER — Telehealth: Payer: Self-pay

## 2022-09-30 NOTE — Telephone Encounter (Signed)
Called and spoke to patient and she expressed that she is doing the recommended treatment plan and it seems as the hives got worse when she began the Allegra. Patient stated that she is going to get the blood work tomorrow. Please advise on another treatment plan if possible.  Patient also sent the pictures via mychart.

## 2022-09-30 NOTE — Telephone Encounter (Signed)
Please call patient.  It's most likely her underlying hives.  Did she get her bw done? Take picture of rash and send via mychart.   Start allegra (fexofenadine) 180mg  1 tablet twice a day. You can take up to 2 tablets twice a day. Stop loratadine and hydroxyzine.  If symptoms are not controlled or causes drowsiness let us know. Continue Pepcid (famotidine) 20mg  twice a day.  Start Singulair (montelukast) 10mg  daily at night.

## 2022-09-30 NOTE — Telephone Encounter (Signed)
Sent patient a mychart message.

## 2022-09-30 NOTE — Telephone Encounter (Signed)
Patient called in - DOB verified- stated she starting breaking out - bumps - on her arms and legs on Saturday, 09/28/22. Patient stated she feels like she started breaking out after taking the sample of Allegra she was given on Friday, 09/27/22.  Reviewed provider directions per 09/27/22 office note with patient - patient stated she is taking the prescribed medication as directed.  Patient advised to take pictures of arms and legs - send pictures to provider to view via her myChart.  Patient advised message would be forwarded to provider for next step .  Patient verbalized understanding, no further questions.

## 2022-10-01 ENCOUNTER — Ambulatory Visit (INDEPENDENT_AMBULATORY_CARE_PROVIDER_SITE_OTHER): Payer: BC Managed Care – PPO | Admitting: Dermatology

## 2022-10-01 ENCOUNTER — Ambulatory Visit: Payer: BC Managed Care – PPO

## 2022-10-01 DIAGNOSIS — Z7189 Other specified counseling: Secondary | ICD-10-CM

## 2022-10-01 DIAGNOSIS — Z79899 Other long term (current) drug therapy: Secondary | ICD-10-CM | POA: Diagnosis not present

## 2022-10-01 DIAGNOSIS — T781XXD Other adverse food reactions, not elsewhere classified, subsequent encounter: Secondary | ICD-10-CM | POA: Diagnosis not present

## 2022-10-01 DIAGNOSIS — L509 Urticaria, unspecified: Secondary | ICD-10-CM | POA: Diagnosis not present

## 2022-10-01 DIAGNOSIS — L508 Other urticaria: Secondary | ICD-10-CM | POA: Diagnosis not present

## 2022-10-01 DIAGNOSIS — L309 Dermatitis, unspecified: Secondary | ICD-10-CM | POA: Diagnosis not present

## 2022-10-01 MED ORDER — XOLAIR 300 MG/2ML ~~LOC~~ SOSY: 600.0000 mg | PREFILLED_SYRINGE | SUBCUTANEOUS | 6 refills | Status: DC

## 2022-10-01 MED ORDER — OMALIZUMAB 150 MG/ML ~~LOC~~ SOSY
150.0000 mg | PREFILLED_SYRINGE | Freq: Once | SUBCUTANEOUS | Status: AC
Start: 2022-10-01 — End: 2022-10-01
  Administered 2022-10-01: 150 mg via SUBCUTANEOUS

## 2022-10-01 NOTE — Patient Instructions (Addendum)
Cont Famotidine 20mg  1 pill twice a day as prescribed by Ellsworth Lennox, PA-C Restart Claritin 1-4 pill a day as needed For Hives (urticaria); dermatographism or Itch: Start non sedating antihistamine (either Allegra 180mg , or Claritin 10mg , or Zyrtec 10mg ) daily.  All these are non-prescription ("Over the Counter").   Start out with 1 pill a day.   After a week if not improving may increase to 2 pills a day.   After another week if not improving may increase to 3 pills a day.   After another week if still not improving may take up to 4 pills a day. Stay at highest dose that keeps condition controlled, but only up to 4 pills a day. Stay at the controlling dose for at least 2 weeks. Contact office if taking 4 pills of antihistamine a day for at least 2 weeks without control of condition as other options may be available. Cont Singulair 10mg  qd Cont Hydroxyzine to 10mg  1 pill a day at bedtime as needed for itch, may make drowsy      Due to recent changes in healthcare laws, you may see results of your pathology and/or laboratory studies on MyChart before the doctors have had a chance to review them. We understand that in some cases there may be results that are confusing or concerning to you. Please understand that not all results are received at the same time and often the doctors may need to interpret multiple results in order to provide you with the best plan of care or course of treatment. Therefore, we ask that you please give [place] 2 business days to thoroughly review all your results before contacting the office for clarification. Should we see a critical lab result, you will be contacted sooner.   If You Need Anything After Your Visit  If you have any questions or concerns for your doctor, please call our main line at [phone] and press option 4 to reach your doctor's medical assistant. If no one answers, please leave a voicemail as directed and we will return your call as soon as possible.  Messages left after 4 pm will be answered the following business day.   You may also send [place] a message via MyChart. We typically respond to MyChart messages within 1-2 business days.  For prescription refills, please ask your pharmacy to contact our office. Our fax number is [phone].  If you have an urgent issue when the clinic is closed that cannot wait until the next business day, you can page your doctor at the number below.    Please note that while we do our best to be available for urgent issues outside of office hours, we are not available 24/7.   If you have an urgent issue and are unable to reach [place], you may choose to seek medical care at your doctor's office, retail clinic, urgent care center, or emergency room.  If you have a medical emergency, please immediately call 911 or go to the emergency department.  Pager Numbers  - Dr. Gwen Pounds: [phone]  - Dr. Neale Burly: [phone]  - Dr. Roseanne Reno: [phone]  In the event of inclement weather, please call our main line at [phone] for an update on the status of any delays or closures.  Dermatology Medication Tips: Please keep the boxes that topical medications come in in order to help keep track of the instructions about where and how to use these. Pharmacies typically print the medication instructions only on the boxes and not directly on  the medication tubes.   If your medication is too expensive, please contact our office at [phone] option 4 or send [place] a message through MyChart.   We are unable to tell what your co-pay for medications will be in advance as this is different depending on your insurance coverage. However, we may be able to find a substitute medication at lower cost or fill out paperwork to get insurance to cover a needed medication.   If a prior authorization is required to get your medication covered by your insurance company, please allow [place] 1-2 business days to complete this process.  Drug  prices often vary depending on where the prescription is filled and some pharmacies may offer cheaper prices.  The website www.goodrx.com contains coupons for medications through different pharmacies. The prices here do not account for what the cost may be with help from insurance (it may be cheaper with your insurance), but the website can give you the price if you did not use any insurance.  - You can print the associated coupon and take it with your prescription to the pharmacy.  - You may also stop by our office during regular business hours and pick up a GoodRx coupon card.  - If you need your prescription sent electronically to a different pharmacy, notify our office through [facility] or by phone at [phone] option 4.     Si Usted Necesita Algo Despus de Su Visita  Tambin puede enviarnos un mensaje a travs de Clinical cytogeneticist. Por lo general respondemos a los mensajes de MyChart en el transcurso de 1 a 2 das hbiles.  Para renovar recetas, por favor pida a su farmacia que se ponga en contacto con nuestra oficina. Annie Sable de fax es [place] [phone].  Si tiene un asunto urgente cuando la clnica est cerrada y que no puede esperar hasta el siguiente da hbil, puede llamar/localizar a su doctor(a) al nmero que aparece a continuacin.   Por favor, tenga en cuenta que aunque hacemos todo lo posible para estar disponibles para asuntos urgentes fuera del horario de [place], no estamos disponibles las 24 horas del da, los 7 [address]  [address] de la [place].   Si tiene un problema urgente y no puede comunicarse con nosotros, puede optar por buscar atencin mdica  en el consultorio de su doctor(a), en una clnica privada, en un centro de atencin urgente o en una sala de emergencias.  Si tiene Engineer, drilling, por favor llame inmediatamente al 911 o vaya a la sala de emergencias.  Nmeros de bper  - Dr. Gwen Pounds: [phone]  - Dra. Moye: [phone]  - Dra. Roseanne Reno:  [phone]  En caso de inclemencias del [place], por favor llame a Lacy Duverney principal al [phone] para una actualizacin sobre el [place] de cualquier retraso o cierre.  Consejos para la medicacin en dermatologa: Por favor, guarde las cajas en las que vienen los medicamentos de uso tpico para ayudarle a seguir las instrucciones sobre dnde y cmo usarlos. Las farmacias generalmente imprimen las instrucciones del medicamento slo en las cajas y no directamente en los tubos del [place].   Si su medicamento es muy caro, por favor, pngase en contacto con Rolm Gala llamando al [phone] y presione la opcin 4 o envenos un mensaje a travs de Clinical cytogeneticist.   No podemos decirle cul ser su copago por los medicamentos por adelantado ya que esto es diferente dependiendo de la cobertura de su seguro. Sin embargo, es posible que podamos encontrar un medicamento sustituto a  menor costo o llenar un formulario para que el seguro cubra el medicamento que se considera necesario.   Si se requiere una autorizacin previa para que su compaa de seguros [place] su medicamento, por favor permtanos de 1 a 2 das hbiles para completar [address]  Los precios de los medicamentos varan con frecuencia dependiendo del Environmental consultant de dnde se surte la receta y alguna farmacias pueden ofrecer precios ms baratos.  El sitio web www.goodrx.com tiene cupones para medicamentos de Health and safety inspector. Los precios aqu no tienen en cuenta lo que podra costar con la ayuda del seguro (puede ser ms barato con su seguro), pero el sitio web puede darle el precio si no utiliz Tourist information centre manager.  - Puede imprimir el cupn correspondiente y llevarlo con su receta a la farmacia.  - Tambin puede pasar por nuestra oficina durante el horario de atencin regular y Education officer, museum una tarjeta de cupones de GoodRx.  - Si necesita que su receta se enve electrnicamente a una farmacia diferente, informe a nuestra oficina a travs de  MyChart de [place] o por telfono llamando al [phone] y presione la opcin 4.

## 2022-10-01 NOTE — Progress Notes (Unsigned)
   Follow-Up Visit   Subjective  Cassandra Pham is a 47 y.o. female who presents for the following: Urticaria, trunk, extremities, Xolair, Famotidine 20mg  bid, Singulair 10mg  1 po qd, 4 days ago changed to Allegra 1 po qd, Hydroxyzine 10mg  1 po hs, but took more often over the weekend, pt feels like she started getting more bumps after starting Allegra so she stopped antihistamine on sunday  The following portions of the chart were reviewed this encounter and updated as appropriate: medications, allergies, medical history  Review of Systems:  No other skin or systemic complaints except as noted in HPI or Assessment and Plan.  Objective  Well appearing patient in no apparent distress; mood and affect are within normal limits. A focused examination was performed of the following areas: Trunk, extremities Relevant exam findings are noted in the Assessment and Plan.   Assessment & Plan   URTICARIA Urticaria or hives is a pink to red patchy whelp- like rash of the skin that typically itches and it is the result of histamine release in the skin.   Hives may have multiple causes including stress, medications, infections, and systemic illness.  Sometimes there is a family history of chronic urticaria.   "Physical urticarias" may be caused by heat, sun, cold, vibration.   Insect bites can cause "papular urticaria". It is often difficult to find the cause of generalized hives.  Statistically, 70% of the time a cause of generalized hives is not found.  Sometimes hives can spontaneously resolve. Other times hives can persist and when it does, and no cause is found, and it has been at least 6 weeks since started, it is called "chronic idiopathic urticaria". Chronic and persistent condition with duration or expected duration over one year. Condition is symptomatic / bothersome to patient. Not to goal.  Exam: pink welts Treatment Plan: Cont Famotidine 20mg  bid as prescribed by Ellsworth Lennox, PA-C Cont  Singulair 10mg  qd  Restart Claritin 1-4 po qd,  Cont Hydroxyzine to 10mg  1 po at bedtime Continue Xolair 150mg /ml x 2 sub q injections q month Xolair 150mg /ml x 2 injections to R and L upper arm today Lot  , exp  BP right after Xolair injections 3:20 122/80 BP 15 minutes after Xolair injections 3:35pm 117/79 BP 30 minutes after Xolair injections 3:50 131/91  Discussed increasing Xolair to 600mg  q month based on severely elevated IgE.   Patient to call in 2 weeks if she is not improving and we can give her another injection of Xolair 300mg .    Greater than 45 minutes spent with evaluation and treatment with patient today.  Urticaria  Related Medications famotidine (PEPCID) 20 MG tablet Take 1 tablet (20 mg total) by mouth 2 (two) times daily.  omalizumab Geoffry Paradise) prefilled syringe 150 mg   Counseling and coordination of care  Medication management   Return in about 1 month (around 11/01/2022) for urticaria.  I, Ardis Rowan, RMA, am acting as scribe for [name], MD .  Documentation: I have reviewed the above documentation for accuracy and completeness, and I agree with the above.  [name], MD

## 2022-10-03 ENCOUNTER — Encounter: Payer: Self-pay | Admitting: Dermatology

## 2022-10-11 LAB — CBC WITH DIFFERENTIAL/PLATELET
Basophils Absolute: 0 10*3/uL (ref 0.0–0.2)
Basos: 1 %
EOS (ABSOLUTE): 0.7 10*3/uL — ABNORMAL HIGH (ref 0.0–0.4)
Eos: 11 %
Hematocrit: 36.9 % (ref 34.0–46.6)
Hemoglobin: 12.5 g/dL (ref 11.1–15.9)
Immature Grans (Abs): 0 10*3/uL (ref 0.0–0.1)
Immature Granulocytes: 0 %
Lymphocytes Absolute: 2.5 10*3/uL (ref 0.7–3.1)
Lymphs: 40 %
MCH: 31.6 pg (ref 26.6–33.0)
MCHC: 33.9 g/dL (ref 31.5–35.7)
MCV: 93 fL (ref 79–97)
Monocytes Absolute: 0.5 10*3/uL (ref 0.1–0.9)
Monocytes: 8 %
Neutrophils Absolute: 2.4 10*3/uL (ref 1.4–7.0)
Neutrophils: 40 %
Platelets: 329 10*3/uL (ref 150–450)
RBC: 3.95 x10E6/uL (ref 3.77–5.28)
RDW: 12.7 % (ref 11.7–15.4)
WBC: 6.2 10*3/uL (ref 3.4–10.8)

## 2022-10-11 LAB — COMPREHENSIVE METABOLIC PANEL
ALT: 17 IU/L (ref 0–32)
AST: 19 IU/L (ref 0–40)
Albumin/Globulin Ratio: 1.5 (ref 1.2–2.2)
Albumin: 4.3 g/dL (ref 3.9–4.9)
Alkaline Phosphatase: 54 IU/L (ref 44–121)
BUN/Creatinine Ratio: 14 (ref 9–23)
BUN: 12 mg/dL (ref 6–24)
Bilirubin Total: 0.5 mg/dL (ref 0.0–1.2)
CO2: 22 mmol/L (ref 20–29)
Calcium: 9.5 mg/dL (ref 8.7–10.2)
Chloride: 103 mmol/L (ref 96–106)
Creatinine, Ser: 0.88 mg/dL (ref 0.57–1.00)
Globulin, Total: 2.8 g/dL (ref 1.5–4.5)
Glucose: 80 mg/dL (ref 70–99)
Potassium: 4.2 mmol/L (ref 3.5–5.2)
Sodium: 140 mmol/L (ref 134–144)
Total Protein: 7.1 g/dL (ref 6.0–8.5)
eGFR: 81 mL/min/{1.73_m2} (ref >59)

## 2022-10-11 LAB — THYROID CASCADE PROFILE: TSH: 3.04 u[IU]/mL (ref 0.450–4.500)

## 2022-10-11 LAB — PROTEIN ELECTROPHORESIS, URINE REFLEX
Albumin ELP, Urine: 100 %
Alpha-1-Globulin, U: 0 %
Alpha-2-Globulin, U: 0 %
Beta Globulin, U: 0 %
Gamma Globulin, U: 0 %
Protein, Ur: 10.9 mg/dL

## 2022-10-11 LAB — PROTEIN ELECTROPHORESIS, SERUM
A/G Ratio: 1.1 (ref 0.7–1.7)
Albumin ELP: 3.7 g/dL (ref 2.9–4.4)
Alpha 1: 0.2 g/dL (ref 0.0–0.4)
Alpha 2: 0.6 g/dL (ref 0.4–1.0)
Beta: 1.2 g/dL (ref 0.7–1.3)
Gamma Globulin: 1.3 g/dL (ref 0.4–1.8)
Globulin, Total: 3.4 g/dL (ref 2.2–3.9)

## 2022-10-11 LAB — CHRONIC URTICARIA: cu index: 6.3 (ref <10)

## 2022-10-11 LAB — ALPHA-GAL PANEL
Allergen Lamb IgE: <0.1 kU/L
Beef IgE: <0.1 kU/L
IgE (Immunoglobulin E), Serum: 1960 IU/mL — ABNORMAL HIGH (ref 6–495)
O215-IgE Alpha-Gal: <0.1 kU/L
Pork IgE: <0.1 kU/L

## 2022-10-11 LAB — C-REACTIVE PROTEIN: CRP: 3 mg/L (ref 0–10)

## 2022-10-11 LAB — C3 AND C4
Complement C3, Serum: 133 mg/dL (ref 82–167)
Complement C4, Serum: 34 mg/dL (ref 12–38)

## 2022-10-11 LAB — TRYPTASE: Tryptase: 5.1 ug/L (ref 2.2–13.2)

## 2022-10-11 LAB — ANA W/REFLEX: Anti Nuclear Antibody (ANA): NEGATIVE

## 2022-10-11 LAB — SEDIMENTATION RATE: Sed Rate: 14 mm/hr (ref 0–32)

## 2022-10-24 ENCOUNTER — Ambulatory Visit: Payer: BC Managed Care – PPO | Admitting: Family Medicine

## 2022-11-06 ENCOUNTER — Ambulatory Visit (INDEPENDENT_AMBULATORY_CARE_PROVIDER_SITE_OTHER): Payer: BC Managed Care – PPO | Admitting: Dermatology

## 2022-11-06 ENCOUNTER — Ambulatory Visit: Payer: BC Managed Care – PPO

## 2022-11-06 DIAGNOSIS — L501 Idiopathic urticaria: Secondary | ICD-10-CM | POA: Diagnosis not present

## 2022-11-06 DIAGNOSIS — Z79899 Other long term (current) drug therapy: Secondary | ICD-10-CM

## 2022-11-06 DIAGNOSIS — L509 Urticaria, unspecified: Secondary | ICD-10-CM

## 2022-11-06 DIAGNOSIS — Z7189 Other specified counseling: Secondary | ICD-10-CM

## 2022-11-06 DIAGNOSIS — L508 Other urticaria: Secondary | ICD-10-CM | POA: Diagnosis not present

## 2022-11-06 MED ORDER — OMALIZUMAB 150 MG/ML ~~LOC~~ SOSY
300.0000 mg | PREFILLED_SYRINGE | Freq: Once | SUBCUTANEOUS | Status: AC
Start: 2022-11-06 — End: 2022-11-06
  Administered 2022-11-06: 300 mg via SUBCUTANEOUS

## 2022-11-06 NOTE — Progress Notes (Signed)
   Follow-Up Visit   Subjective  Cassandra Pham is a 47 y.o. female who presents for the following: Urticaria follow up  The following portions of the chart were reviewed this encounter and updated as appropriate: medications, allergies, medical history  Review of Systems:  No other skin or systemic complaints except as noted in HPI or Assessment and Plan.  Objective  Well appearing patient in no apparent distress; mood and affect are within normal limits. A focused examination was performed of the following areas: arms Relevant exam findings are noted in the Assessment and Plan.   Assessment & Plan   URTICARIA Chronic idiopathic urticaria  Chronic and persistent condition with duration or expected duration over one year. Condition is symptomatic / bothersome to patient. Not to goal.  Urticaria or hives is a pink to red patchy whelp- like rash of the skin that typically itches and it is the result of histamine release in the skin.   Hives may have multiple causes including stress, medications, infections, and systemic illness.  Sometimes there is a family history of chronic urticaria.   "Physical urticarias" may be caused by heat, sun, cold, vibration.   Insect bites can cause "papular urticaria". It is often difficult to find the cause of generalized hives.  Statistically, 70% of the time a cause of generalized hives is not found.  Sometimes hives can spontaneously resolve. Other times hives can persist and when it does, and no cause is found, and it has been at least 6 weeks since started, it is called "chronic idiopathic urticaria".  Chronic and persistent condition with duration or expected duration over one year. Condition is symptomatic / bothersome to patient. Not to goal.   Exam: pink welts Treatment Plan: Cont Famotidine 20mg  bid as prescribed by Ellsworth Lennox, PA-C Cont Singulair 10mg  qd  Restart Claritin 1-4 po qd,  Cont Hydroxyzine to 10mg  1 po at bedtime Continue  Xolair 150mg /ml x 2 sub q injections q month Xolair 150mg /ml x 2 injections to right upper arm and left upper arm today  NDC #[address]-40  Lot [id number]  Exp 10/2023   BP right after Xolair injections 4:00 pm 122/72 BP 15 minutes after Xolair injections 4:15 pm 120/82 BP 30 minutes after Xolair injections 4:30 pm 120/80   Discussed increasing Xolair to 600mg  q month based on severely elevated IgE.  Patient to call in 2 weeks if she is not improving and we can give her another injection of Xolair 300mg .     Copy of IgE labs given today. Patient advised to discuss with allergist for their opinion at her follow up appointment in 2 weeks. I feel she should consider increasing her dose to 600 mg due to the fact that she is still symptomatic and getting active hives despite the 300 mg dosing.  Greater than 45 minutes spent with evaluation and treatment with patient today.  Urticaria  Related Medications famotidine (PEPCID) 20 MG tablet Take 1 tablet (20 mg total) by mouth 2 (two) times daily.  omalizumab Geoffry Paradise) prefilled syringe 300 mg    Return in about 1 month (around 12/06/2022) for Follow up.  I, Joanie Coddington, CMA, am acting as scribe for [name], MD .  Documentation: I have reviewed the above documentation for accuracy and completeness, and I agree with the above.  [name], MD

## 2022-11-06 NOTE — Progress Notes (Signed)
ERROR

## 2022-11-06 NOTE — Patient Instructions (Signed)
Due to recent changes in healthcare laws, you may see results of your pathology and/or laboratory studies on MyChart before the doctors have had a chance to review them. We understand that in some cases there may be results that are confusing or concerning to you. Please understand that not all results are received at the same time and often the doctors may need to interpret multiple results in order to provide you with the best plan of care or course of treatment. Therefore, we ask that you please give us 2 business days to thoroughly review all your results before contacting the office for clarification. Should we see a critical lab result, you will be contacted sooner.   If You Need Anything After Your Visit  If you have any questions or concerns for your doctor, please call our main line at [phone] and press option 4 to reach your doctor's medical assistant. If no one answers, please leave a voicemail as directed and we will return your call as soon as possible. Messages left after 4 pm will be answered the following business day.   You may also send us a message via MyChart. We typically respond to MyChart messages within 1-2 business days.  For prescription refills, please ask your pharmacy to contact our office. Our fax number is [phone].  If you have an urgent issue when the clinic is closed that cannot wait until the next business day, you can page your doctor at the number below.    Please note that while we do our best to be available for urgent issues outside of office hours, we are not available 24/7.   If you have an urgent issue and are unable to reach us, you may choose to seek medical care at your doctor's office, retail clinic, urgent care center, or emergency room.  If you have a medical emergency, please immediately call 911 or go to the emergency department.  Pager Numbers  - Dr. Kowalski: [phone]  - Dr. Moye: [phone]  - Dr. Stewart:  [phone]  In the event of inclement weather, please call our main line at [phone] for an update on the status of any delays or closures.  Dermatology Medication Tips: Please keep the boxes that topical medications come in in order to help keep track of the instructions about where and how to use these. Pharmacies typically print the medication instructions only on the boxes and not directly on the medication tubes.   If your medication is too expensive, please contact our office at [phone] option 4 or send us a message through MyChart.   We are unable to tell what your co-pay for medications will be in advance as this is different depending on your insurance coverage. However, we may be able to find a substitute medication at lower cost or fill out paperwork to get insurance to cover a needed medication.   If a prior authorization is required to get your medication covered by your insurance company, please allow us 1-2 business days to complete this process.  Drug prices often vary depending on where the prescription is filled and some pharmacies may offer cheaper prices.  The website www.goodrx.com contains coupons for medications through different pharmacies. The prices here do not account for what the cost may be with help from insurance (it may be cheaper with your insurance), but the website can give you the price if you did not use any insurance.  - You can print the associated coupon and take it with   your prescription to the pharmacy.  - You may also stop by our office during regular business hours and pick up a GoodRx coupon card.  - If you need your prescription sent electronically to a different pharmacy, notify our office through [place] MyChart or by phone at [phone] option 4.     Si Usted Necesita Algo Despus de Su Visita  Tambin puede enviarnos un mensaje a travs de MyChart. Por lo general respondemos a los mensajes de MyChart en el transcurso de 1 a 2  das hbiles.  Para renovar recetas, por favor pida a su farmacia que se ponga en contacto con nuestra oficina. Nuestro nmero de fax es el [phone].  Si tiene un asunto urgente cuando la clnica est cerrada y que no puede esperar hasta el siguiente da hbil, puede llamar/localizar a su doctor(a) al nmero que aparece a continuacin.   Por favor, tenga en cuenta que aunque hacemos todo lo posible para estar disponibles para asuntos urgentes fuera del horario de oficina, no estamos disponibles las 24 horas del da, los 7 das de la semana.   Si tiene un problema urgente y no puede comunicarse con nosotros, puede optar por buscar atencin mdica  en el consultorio de su doctor(a), en una clnica privada, en un centro de atencin urgente o en una sala de emergencias.  Si tiene una emergencia mdica, por favor llame inmediatamente al 911 o vaya a la sala de emergencias.  Nmeros de bper  - Dr. Kowalski: [phone]  - Dra. Moye: [phone]  - Dra. Stewart: [phone]  En caso de inclemencias del tiempo, por favor llame a nuestra lnea principal al [phone] para una actualizacin sobre el estado de cualquier retraso o cierre.  Consejos para la medicacin en dermatologa: Por favor, guarde las cajas en las que vienen los medicamentos de uso tpico para ayudarle a seguir las instrucciones sobre dnde y cmo usarlos. Las farmacias generalmente imprimen las instrucciones del medicamento slo en las cajas y no directamente en los tubos del medicamento.   Si su medicamento es muy caro, por favor, pngase en contacto con nuestra oficina llamando al [phone] y presione la opcin 4 o envenos un mensaje a travs de MyChart.   No podemos decirle cul ser su copago por los medicamentos por adelantado ya que esto es diferente dependiendo de la cobertura de su seguro. Sin embargo, es posible que podamos encontrar un medicamento sustituto a menor costo o llenar un formulario para que el  seguro cubra el medicamento que se considera necesario.   Si se requiere una autorizacin previa para que su compaa de seguros cubra su medicamento, por favor permtanos de 1 a 2 das hbiles para completar este proceso.  Los precios de los medicamentos varan con frecuencia dependiendo del lugar de dnde se surte la receta y alguna farmacias pueden ofrecer precios ms baratos.  El sitio web www.goodrx.com tiene cupones para medicamentos de diferentes farmacias. Los precios aqu no tienen en cuenta lo que podra costar con la ayuda del seguro (puede ser ms barato con su seguro), pero el sitio web puede darle el precio si no utiliz ningn seguro.  - Puede imprimir el cupn correspondiente y llevarlo con su receta a la farmacia.  - Tambin puede pasar por nuestra oficina durante el horario de atencin regular y recoger una tarjeta de cupones de GoodRx.  - Si necesita que su receta se enve electrnicamente a una farmacia diferente, informe a nuestra oficina a travs de MyChart de [place]   o por telfono llamando al [phone] y presione la opcin 4.  

## 2022-11-08 ENCOUNTER — Encounter: Payer: Self-pay | Admitting: Dermatology

## 2022-12-19 ENCOUNTER — Ambulatory Visit (INDEPENDENT_AMBULATORY_CARE_PROVIDER_SITE_OTHER): Payer: BC Managed Care – PPO | Admitting: Dermatology

## 2022-12-19 VITALS — BP 115/81

## 2022-12-19 DIAGNOSIS — Z79899 Other long term (current) drug therapy: Secondary | ICD-10-CM

## 2022-12-19 DIAGNOSIS — L509 Urticaria, unspecified: Secondary | ICD-10-CM

## 2022-12-19 DIAGNOSIS — Z7189 Other specified counseling: Secondary | ICD-10-CM | POA: Diagnosis not present

## 2022-12-19 DIAGNOSIS — L501 Idiopathic urticaria: Secondary | ICD-10-CM

## 2022-12-19 MED ORDER — OMALIZUMAB 150 MG/ML ~~LOC~~ SOSY
150.0000 mg | PREFILLED_SYRINGE | Freq: Once | SUBCUTANEOUS | Status: AC
Start: 2022-12-19 — End: 2022-12-19
  Administered 2022-12-19: 150 mg via SUBCUTANEOUS

## 2022-12-19 NOTE — Patient Instructions (Addendum)
Continue Famotidine 20mg  twice a day as prescribed by Ellsworth Lennox, PA-C Restart Singulair 10mg  once daily ContinueClaritin up to 4 pills a day  Cont Hydroxyzine to 10mg  1 pill at bedtime as needed for itch, may make drowsy  Due to recent changes in healthcare laws, you may see results of your pathology and/or laboratory studies on MyChart before the doctors have had a chance to review them. We understand that in some cases there may be results that are confusing or concerning to you. Please understand that not all results are received at the same time and often the doctors may need to interpret multiple results in order to provide you with the best plan of care or course of treatment. Therefore, we ask that you please give [place] 2 business days to thoroughly review all your results before contacting the office for clarification. Should we see a critical lab result, you will be contacted sooner.   If You Need Anything After Your Visit  If you have any questions or concerns for your doctor, please call our main line at [phone] and press option 4 to reach your doctor's medical assistant. If no one answers, please leave a voicemail as directed and we will return your call as soon as possible. Messages left after 4 pm will be answered the following business day.   You may also send [place] a message via MyChart. We typically respond to MyChart messages within 1-2 business days.  For prescription refills, please ask your pharmacy to contact our office. Our fax number is [phone].  If you have an urgent issue when the clinic is closed that cannot wait until the next business day, you can page your doctor at the number below.    Please note that while we do our best to be available for urgent issues outside of office hours, we are not available 24/7.   If you have an urgent issue and are unable to reach [place], you may choose to seek medical care at your doctor's office, retail clinic, urgent care center,  or emergency room.  If you have a medical emergency, please immediately call 911 or go to the emergency department.  Pager Numbers  - Dr. Gwen Pounds: [phone]  - Dr. Neale Burly: [phone]  - Dr. Roseanne Reno: [phone]  In the event of inclement weather, please call our main line at [phone] for an update on the status of any delays or closures.  Dermatology Medication Tips: Please keep the boxes that topical medications come in in order to help keep track of the instructions about where and how to use these. Pharmacies typically print the medication instructions only on the boxes and not directly on the medication tubes.   If your medication is too expensive, please contact our office at [phone] option 4 or send [place] a message through MyChart.   We are unable to tell what your co-pay for medications will be in advance as this is different depending on your insurance coverage. However, we may be able to find a substitute medication at lower cost or fill out paperwork to get insurance to cover a needed medication.   If a prior authorization is required to get your medication covered by your insurance company, please allow [place] 1-2 business days to complete this process.  Drug prices often vary depending on where the prescription is filled and some pharmacies may offer cheaper prices.  The website www.goodrx.com contains coupons for medications through different pharmacies. The prices here do not account for what the  cost may be with help from insurance (it may be cheaper with your insurance), but the website can give you the price if you did not use any insurance.  - You can print the associated coupon and take it with your prescription to the pharmacy.  - You may also stop by our office during regular business hours and pick up a GoodRx coupon card.  - If you need your prescription sent electronically to a different pharmacy, notify our office through [facility] or by phone at  [phone] option 4.     Si Usted Necesita Algo Despus de Su Visita  Tambin puede enviarnos un mensaje a travs de Clinical cytogeneticist. Por lo general respondemos a los mensajes de MyChart en el transcurso de 1 a 2 das hbiles.  Para renovar recetas, por favor pida a su farmacia que se ponga en contacto con nuestra oficina. Annie Sable de fax es [place] [phone].  Si tiene un asunto urgente cuando la clnica est cerrada y que no puede esperar hasta el siguiente da hbil, puede llamar/localizar a su doctor(a) al nmero que aparece a continuacin.   Por favor, tenga en cuenta que aunque hacemos todo lo posible para estar disponibles para asuntos urgentes fuera del horario de [place], no estamos disponibles las 24 horas del da, los 7 [address]  [address] de la [place].   Si tiene un problema urgente y no puede comunicarse con nosotros, puede optar por buscar atencin mdica  en el consultorio de su doctor(a), en una clnica privada, en un centro de atencin urgente o en una sala de emergencias.  Si tiene Engineer, drilling, por favor llame inmediatamente al 911 o vaya a la sala de emergencias.  Nmeros de bper  - Dr. Gwen Pounds: [phone]  - Dra. Moye: [phone]  - Dra. Roseanne Reno: [phone]  En caso de inclemencias del [place], por favor llame a Lacy Duverney principal al [phone] para una actualizacin sobre el [place] de cualquier retraso o cierre.  Consejos para la medicacin en dermatologa: Por favor, guarde las cajas en las que vienen los medicamentos de uso tpico para ayudarle a seguir las instrucciones sobre dnde y cmo usarlos. Las farmacias generalmente imprimen las instrucciones del medicamento slo en las cajas y no directamente en los tubos del [place].   Si su medicamento es muy caro, por favor, pngase en contacto con Rolm Gala llamando al [phone] y presione la opcin 4 o envenos un mensaje a travs de Clinical cytogeneticist.   No podemos decirle cul ser su copago por los  medicamentos por adelantado ya que esto es diferente dependiendo de la cobertura de su seguro. Sin embargo, es posible que podamos encontrar un medicamento sustituto a Audiological scientist un formulario para que el seguro cubra el medicamento que se considera necesario.   Si se requiere una autorizacin previa para que su compaa de seguros [place] su medicamento, por favor permtanos de 1 a 2 das hbiles para completar [address]  Los precios de los medicamentos varan con frecuencia dependiendo del Environmental consultant de dnde se surte la receta y alguna farmacias pueden ofrecer precios ms baratos.  El sitio web www.goodrx.com tiene cupones para medicamentos de Health and safety inspector. Los precios aqu no tienen en cuenta lo que podra costar con la ayuda del seguro (puede ser ms barato con su seguro), pero el sitio web puede darle el precio si no utiliz Tourist information centre manager.  - Puede imprimir el cupn correspondiente y llevarlo con su receta a la farmacia.  - Tambin puede pasar por  nuestra oficina durante el horario de atencin regular y Education officer, museum una tarjeta de cupones de GoodRx.  - Si necesita que su receta se enve electrnicamente a una farmacia diferente, informe a nuestra oficina a travs de MyChart de [place] o por telfono llamando al [phone] y presione la opcin 4.

## 2022-12-19 NOTE — Progress Notes (Signed)
Follow-Up Visit   Subjective  Cassandra Pham is a 47 y.o. female who presents for the following: Famitidine 20mg  bid, Claritin 4x/day, Hydroxyzine 10mg  1 po hs, not taking Singulair, Xolair injections q wk, pt is still having flares  The following portions of the chart were reviewed this encounter and updated as appropriate: medications, allergies, medical history  Review of Systems:  No other skin or systemic complaints except as noted in HPI or Assessment and Plan.  Objective  Well appearing patient in no apparent distress; mood and affect are within normal limits. A focused examination was performed of the following areas: Arms, face Relevant exam findings are noted in the Assessment and Plan.   Assessment & Plan   URTICARIA Chronic Idiopathic Urticaria Trunk, extremities Exam: edematous pink papules and plaques arms Chronic and persistent condition with duration or expected duration over one year. Condition is symptomatic / bothersome to patient. Not to goal.   Urticaria or hives is a pink to red patchy whelp- like rash of the skin that typically itches and it is the result of histamine release in the skin.   Hives may have multiple causes including stress, medications, infections, and systemic illness.  Sometimes there is a family history of chronic urticaria.   "Physical urticarias" may be caused by pressure (dermatographism), heat, sun, cold, vibration.   Insect bites can cause "papular urticaria". It is often difficult to find the cause of generalized hives.  Statistically, 70% of the time a cause of generalized hives is not found.  Sometimes hives can spontaneously resolve. Other times hives can persist and when it does, and no cause is found, and it has been at least 6 weeks since started, it is called "chronic idiopathic urticaria". Antihistamines are the mainstay for treatment.  In severe cases Xolair injections may be used.   Long term medication management.  Patient  is using long term (months to years) prescription medication  to control their dermatologic condition.  These medications require periodic monitoring to evaluate for efficacy and side effects and may require periodic laboratory monitoring.   Treatment Plan: Cont Famotidine 20mg  bid as prescribed by Ellsworth Lennox, PA-C Restart Singulair 10mg  qd  Cont Claritin up to 4 po qd,  Cont Hydroxyzine to 10mg  1 po at bedtime, may make drowsy  Continue Xolair 150mg /ml x 2 sub q injections q month Xolair 150mg /ml x 2 injections to right upper arm and left upper arm today  samples x 2 NDC 50242-215-86 Lot [id number], exp 12/2022, NDC 56314-970-26 Lot [id number], exp 08/2023  Discussed getting another opinion from another allergist at [facility] or [facility], Discussed referral to  Dr Karren Burly in [place], pt declines at this time  Discussed pt needs to talk to [place] about processing/getting approval for Xolair 600mg . The patient's IgE is elevated significantly and higher dose of Xolair is indicated due to high IgE + patient not controlled on current dose.  Urticaria  Related Medications famotidine (PEPCID) 20 MG tablet Take 1 tablet (20 mg total) by mouth 2 (two) times daily.  omalizumab Geoffry Paradise) prefilled syringe 150 mg   Medication management  Long-term use of high-risk medication  Counseling and coordination of care  Chronic idiopathic urticaria  BP before Xolair - 9:24 am 115/81 BP right after Xolair - 9:36 am 127/86 BP 15 minutes after Xolair - 9:51 am 122/85 BP 30 minutes after Xolair - 10:06 am 122/86  Greater than 45 minutes today in evaluation and treatment.  Return in about 1 month (around 01/19/2023)  for Xolair.  I, Ardis Rowan, RMA, am acting as scribe for [name], MD .  Documentation: I have reviewed the above documentation for accuracy and completeness, and I agree with the above.  [name], MD

## 2022-12-23 ENCOUNTER — Encounter: Payer: Self-pay | Admitting: Dermatology

## 2022-12-26 ENCOUNTER — Ambulatory Visit (INDEPENDENT_AMBULATORY_CARE_PROVIDER_SITE_OTHER): Payer: Self-pay | Admitting: Adult Health

## 2022-12-26 ENCOUNTER — Encounter: Payer: Self-pay | Admitting: Adult Health

## 2022-12-26 ENCOUNTER — Other Ambulatory Visit: Payer: Self-pay

## 2022-12-26 VITALS — BP 122/90 | HR 90 | Temp 97.5°F | Ht 60.0 in | Wt 134.0 lb

## 2022-12-26 DIAGNOSIS — B349 Viral infection, unspecified: Secondary | ICD-10-CM

## 2022-12-26 DIAGNOSIS — R52 Pain, unspecified: Secondary | ICD-10-CM

## 2022-12-26 DIAGNOSIS — R300 Dysuria: Secondary | ICD-10-CM

## 2022-12-26 LAB — POCT URINALYSIS DIPSTICK (MANUAL)
Nitrite, UA: NEGATIVE
Poct Bilirubin: NEGATIVE
Poct Blood: NEGATIVE
Poct Glucose: NORMAL mg/dL
Poct Ketones: NEGATIVE
Poct Urobilinogen: NORMAL mg/dL
Spec Grav, UA: 1.02 (ref 1.010–1.025)
pH, UA: 7 (ref 5.0–8.0)

## 2022-12-26 LAB — POC SOFIA 2 FLU + SARS ANTIGEN FIA
Influenza A, POC: NEGATIVE
Influenza B, POC: NEGATIVE
SARS Coronavirus 2 Ag: NEGATIVE

## 2022-12-26 MED ORDER — NITROFURANTOIN MONOHYD MACRO 100 MG PO CAPS
100.0000 mg | ORAL_CAPSULE | Freq: Two times a day (BID) | ORAL | 0 refills | Status: DC
Start: 2022-12-26 — End: 2023-08-05

## 2022-12-26 MED ORDER — IBUPROFEN 800 MG PO TABS
800.0000 mg | ORAL_TABLET | Freq: Three times a day (TID) | ORAL | 0 refills | Status: DC | PRN
Start: 2022-12-26 — End: 2023-09-08

## 2022-12-26 NOTE — Progress Notes (Signed)
[facility] [address] [place], [place] [address]   Office Visit Note  Patient Name: Cassandra Pham Date of Birth [id number]  Medical Record number [id number]  Date of Service: 12/26/2022  Chief Complaint  Patient presents with   Acute Visit    Patient c/o body aches, scratchy throat, and hoarseness in her voice. Symptoms began about 2 days ago. She has tried OTC Mucinex and ibuprofen which she feels has been somewhat helpful. Patient also reports a slight burning sensation when she urinates for the last 2 days. She is concerned she may have a UTI.      HPI Pt is here for a sick visit. She reports 2 days of sore throat that comes and goes. She started taking Mucinex.  She also has been having body aches, chills, headache. Denies cough, and fever.  She also reports some UTi symptoms.  She describes tingling with urination.    Current Medication:  Outpatient Encounter Medications as of 12/26/2022  Medication Sig   aspirin-acetaminophen-caffeine (EXCEDRIN MIGRAINE) 250-250-65 MG tablet Take by mouth every 6 (six) hours as needed for headache.   famotidine (PEPCID) 20 MG tablet Take 1 tablet (20 mg total) by mouth 2 (two) times daily.   fexofenadine (ALLEGRA ALLERGY) 180 MG tablet Take 1-2 tablets twice a day for rash/hives/itching.   montelukast (SINGULAIR) 10 MG tablet Take 1 tablet (10 mg total) by mouth at bedtime.   nitrofurantoin, macrocrystal-monohydrate, (MACROBID) 100 MG capsule Take 1 capsule (100 mg total) by mouth 2 (two) times daily.   omalizumab (XOLAIR) 300 MG/2  ML pen Inject 600 mg into the skin every 30 (thirty) days.   [DISCONTINUED] ibuprofen (ADVIL) 800 MG tablet Take 1 tablet (800 mg total) by mouth every 8 (eight) hours as needed.   ibuprofen (ADVIL) 800 MG tablet Take 1 tablet (800 mg total) by mouth every 8 (eight) hours as needed.   No facility-administered encounter medications on file as of 12/26/2022.      Medical History: Past Medical History:   Diagnosis Date   Anxiety and depression    Asthma    Bacterial vaginosis    BRCA negative 02/2020   MyRisk neg except NTHL1 VUS; IBIS=7.4%/riskscore=7.2%   Family history of ovarian cancer 02/2019   MyRisk testing declined   Family history of pancreatic cancer    Medical history non-contributory    Migraine    Urticaria    UTI (urinary tract infection)      Vital Signs: BP (!) 122/90 (BP Location: Left Arm, Patient Position: Sitting, Cuff Size: Normal)   Pulse 90   Temp (!) 97.5 F (36.4 C)   Ht 5' (1.524 m)   Wt 134 lb (60.8 kg)   SpO2 98%   BMI 26.17 kg/m    Review of Systems  Constitutional:  Positive for chills and fatigue. Negative for fever.  HENT:  Positive for sore throat. Negative for postnasal drip.   Genitourinary:  Positive for dysuria. Negative for flank pain.  Musculoskeletal:  Positive for myalgias.    Physical Exam Vitals and nursing note reviewed.  Constitutional:      General: She is not in acute distress.    Appearance: Normal appearance. She is normal weight. She is not ill-appearing.  HENT:     Head: Normocephalic.     Right Ear: Tympanic membrane and ear canal normal.     Left Ear: Tympanic membrane and ear canal normal.     Nose: Nose normal.  Mouth/Throat:     Mouth: Mucous membranes are dry.  Eyes:     General:        Right eye: No discharge.        Left eye: No discharge.     Pupils: Pupils are equal, round, and reactive to light.  Neck:     Vascular: No carotid bruit.  Cardiovascular:     Rate and Rhythm: Normal rate and regular rhythm.     Pulses: Normal pulses.     Heart sounds: No murmur heard.    No friction rub.  Pulmonary:     Effort: Pulmonary effort is normal. No respiratory distress.     Breath sounds: Normal breath sounds. No wheezing.  Abdominal:     General: Abdomen is flat. Bowel sounds are normal.     Palpations: Abdomen is soft. There is no mass.     Tenderness: There is no abdominal tenderness. There is  no right CVA tenderness or left CVA tenderness.     Hernia: No hernia is present.  Musculoskeletal:        General: Normal range of motion.     Cervical back: Normal range of motion. No rigidity.  Lymphadenopathy:     Cervical: No cervical adenopathy.  Skin:    General: Skin is warm and dry.  Neurological:     General: No focal deficit present.     Mental Status: She is alert and oriented to person, place, and time.  Psychiatric:        Mood and Affect: Mood normal.        Behavior: Behavior normal.    Results for orders placed or performed in visit on 12/26/22 (from the past 24 hour(s))  POCT Urinalysis Dip Manual     Status: Abnormal   Collection Time: 12/26/22 10:15 AM  Result Value Ref Range   Spec Grav, UA 1.020 1.010 - 1.025   pH, UA 7.0 5.0 - 8.0   Leukocytes, UA Small (1+) (A) Negative   Nitrite, UA Negative Negative   Poct Protein trace Negative, trace mg/dL   Poct Glucose Normal Normal mg/dL   Poct Ketones Negative Negative   Poct Urobilinogen Normal Normal mg/dL   Poct Bilirubin Negative Negative   Poct Blood Negative Negative, trace  POC SOFIA 2 FLU + SARS ANTIGEN FIA     Status: Normal   Collection Time: 12/26/22 10:16 AM  Result Value Ref Range   Influenza A, POC Negative Negative   Influenza B, POC Negative Negative   SARS Coronavirus 2 Ag Negative Negative    Assessment/Plan: 1. Viral illness Take over-the-counter medicines (such as Dayquil or Nyquil) as discussed at your visit to help manage your symptoms.  Send a MyChart message to the provider or schedule a return appointment as needed for new/worsening symptoms (especially shortness of breath or chest pain) or if symptoms not improving with recommended treatment over the next 5-7 days.      2. Body aches - POC SOFIA 2 FLU + SARS ANTIGEN FIA - ibuprofen (ADVIL) 800 MG tablet; Take 1 tablet (800 mg total) by mouth every 8 (eight) hours as needed.  Dispense: 30 tablet; Refill: 0  3. Dysuria - POCT  Urinalysis Dip Manual - nitrofurantoin, macrocrystal-monohydrate, (MACROBID) 100 MG capsule; Take 1 capsule (100 mg total) by mouth 2 (two) times daily.  Dispense: 14 capsule; Refill: 0      General Counseling: Cassandra Pham verbalizes understanding of the findings of todays visit and agrees with plan of  treatment. I have discussed any further diagnostic evaluation that may be needed or ordered today. We also reviewed her medications today. she has been encouraged to call the office with any questions or concerns that should arise related to todays visit.   Orders Placed This Encounter  Procedures   POC SOFIA 2 FLU + SARS ANTIGEN FIA   POCT Urinalysis Dip Manual    Meds ordered this encounter  Medications   nitrofurantoin, macrocrystal-monohydrate, (MACROBID) 100 MG capsule    Sig: Take 1 capsule (100 mg total) by mouth 2 (two) times daily.    Dispense:  14 capsule    Refill:  0   ibuprofen (ADVIL) 800 MG tablet    Sig: Take 1 tablet (800 mg total) by mouth every 8 (eight) hours as needed.    Dispense:  30 tablet    Refill:  0    Time spent:15 Minutes    Johnna Acosta AGNP-C Nurse Practitioner

## 2023-01-15 ENCOUNTER — Encounter: Payer: Self-pay | Admitting: Adult Health

## 2023-01-15 ENCOUNTER — Ambulatory Visit (INDEPENDENT_AMBULATORY_CARE_PROVIDER_SITE_OTHER): Payer: Self-pay | Admitting: Adult Health

## 2023-01-15 ENCOUNTER — Other Ambulatory Visit: Payer: Self-pay

## 2023-01-15 VITALS — BP 121/64 | HR 85 | Temp 96.7°F

## 2023-01-15 DIAGNOSIS — J029 Acute pharyngitis, unspecified: Secondary | ICD-10-CM

## 2023-01-15 LAB — POC COVID19 BINAXNOW: SARS Coronavirus 2 Ag: NEGATIVE

## 2023-01-15 MED ORDER — AZITHROMYCIN 250 MG PO TABS
ORAL_TABLET | ORAL | 0 refills | Status: AC
Start: 2023-01-15 — End: 2023-01-20

## 2023-01-15 NOTE — Progress Notes (Signed)
[facility] [address] [place], [place] [address]   Office Visit Note  Patient Name: Cassandra Pham Date of Birth [id number]  Medical Record number [id number]  Date of Service: 01/15/2023  Chief Complaint  Patient presents with   sick    Seen back on August 1, states she got better but this is new set of symtopms, sore throat started about 2 days ago, scratching feeling, coughing-dry and states her chest is hurting, history of ashtma, non smoker     HPI Pt is here for a sick visit. Pt reports 2 days of scratchy throat and cough. She describes pain with coughing.  She is now having congestion/voice change. She is having some headache, and hot flashes as well.  Daughter was sick with virus over the last week.    Current Medication:  Outpatient Encounter Medications as of 01/15/2023  Medication Sig   aspirin-acetaminophen-caffeine (EXCEDRIN MIGRAINE) 250-250-65 MG tablet Take by mouth every 6 (six) hours as needed for headache.   famotidine (PEPCID) 20 MG tablet Take 1 tablet (20 mg total) by mouth 2 (two) times daily.   fexofenadine (ALLEGRA ALLERGY) 180 MG tablet Take 1-2 tablets twice a day for rash/hives/itching.   ibuprofen (ADVIL) 800 MG tablet Take 1 tablet (800 mg total) by mouth every 8 (eight) hours as needed.   montelukast (SINGULAIR) 10 MG tablet Take 1 tablet (10 mg total) by mouth at bedtime.   nitrofurantoin, macrocrystal-monohydrate, (MACROBID) 100 MG capsule Take 1 capsule (100 mg total) by mouth 2 (two) times daily.   omalizumab (XOLAIR) 300 MG/2  ML pen Inject 600 mg into the skin every 30 (thirty) days.   No facility-administered encounter medications on file as of 01/15/2023.      Medical History: Past Medical History:  Diagnosis Date   Anxiety and depression    Asthma    Bacterial vaginosis    BRCA negative 02/2020   MyRisk neg except NTHL1 VUS; IBIS=7.4%/riskscore=7.2%   Family history of ovarian cancer 02/2019   MyRisk testing declined    Family history of pancreatic cancer    Medical history non-contributory    Migraine    Urticaria    UTI (urinary tract infection)      Vital Signs: BP 121/64   Pulse 85   Temp (!) 96.7 F (35.9 C) (Tympanic)   SpO2 99%    Review of Systems  Constitutional:  Negative for chills, fatigue and fever.  HENT:  Positive for sore throat. Negative for congestion, postnasal drip and sinus pressure.   Eyes:  Negative for pain and itching.  Respiratory:  Positive for cough.   Cardiovascular:  Negative for chest pain.  Gastrointestinal:  Negative for diarrhea, nausea and vomiting.    Physical Exam Vitals reviewed.  Constitutional:      Appearance: Normal appearance.  HENT:     Head: Normocephalic.     Right Ear: Tympanic membrane and ear canal normal.     Left Ear: Tympanic membrane and ear canal normal.     Nose: Nose normal.     Mouth/Throat:     Mouth: Mucous membranes are moist.     Pharynx: Posterior oropharyngeal erythema present.  Eyes:     Pupils: Pupils are equal, round, and reactive to light.  Neck:     Comments: Anterior cervical adenopathy Pulmonary:     Effort: Pulmonary effort is normal.     Breath sounds: Normal breath sounds.  Lymphadenopathy:     Cervical: Cervical adenopathy present.  Neurological:     Mental Status: She is alert.    Results for orders placed or performed in visit on 01/15/23 (from the past 24 hour(s))  POC COVID-19     Status: Normal   Collection Time: 01/15/23 11:14 AM  Result Value Ref Range   SARS Coronavirus 2 Ag Negative Negative    Assessment/Plan: 1. Pharyngitis, unspecified etiology Take Zpak as directed. Follow up via MyChart messenger if symptoms fail to improve or may return to clinic as needed for worsening symptoms.   - azithromycin (ZITHROMAX) 250 MG tablet; Take 2 tablets on day 1, then 1 tablet daily on days 2 through 5  Dispense: 6 tablet; Refill: 0  2. Sore throat - POC COVID-19     General Counseling: Tyjah  verbalizes understanding of the findings of todays visit and agrees with plan of treatment. I have discussed any further diagnostic evaluation that may be needed or ordered today. We also reviewed her medications today. she has been encouraged to call the office with any questions or concerns that should arise related to todays visit.   Orders Placed This Encounter  Procedures   POC COVID-19    No orders of the defined types were placed in this encounter.   Time spent:15 Minutes    Johnna Acosta AGNP-C Nurse Practitioner

## 2023-01-30 ENCOUNTER — Ambulatory Visit (INDEPENDENT_AMBULATORY_CARE_PROVIDER_SITE_OTHER): Payer: BC Managed Care – PPO | Admitting: Dermatology

## 2023-01-30 DIAGNOSIS — L501 Idiopathic urticaria: Secondary | ICD-10-CM | POA: Diagnosis not present

## 2023-01-30 DIAGNOSIS — Z79899 Other long term (current) drug therapy: Secondary | ICD-10-CM | POA: Diagnosis not present

## 2023-01-30 DIAGNOSIS — L509 Urticaria, unspecified: Secondary | ICD-10-CM

## 2023-01-30 NOTE — Progress Notes (Unsigned)
Follow-Up Visit   Subjective  Cassandra Pham is a 47 y.o. female who presents for the following: Urticaria. Famotidine 20mg  bid, Claritin 4 po q day, Hydroxyzine 10mg  1 po hs prn (takes about once a week),  Xolair injections q month.  Not taking Singulair. Patient has breakouts about 4 out of 7 days. The hives often will resolve within hours, but may last all day.  The following portions of the chart were reviewed this encounter and updated as appropriate:   Tobacco  Allergies  Meds  Problems  Med Hx  Surg Hx  Fam Hx      Review of Systems:  No other skin or systemic complaints except as noted in HPI or Assessment and Plan.  Objective  Well appearing patient in no apparent distress; mood and affect are within normal limits.  A focused examination was performed including face, arms. Relevant physical exam findings are noted in the Assessment and Plan.  trunk, extremities Dermatographism     Assessment & Plan  Urticaria trunk, extremities  Chronic Idiopathic Urticaria  Urticaria or hives is a pink to red patchy whelp- like rash of the skin that typically itches and it is the result of histamine release in the skin.   Hives may have multiple causes including stress, medications, infections, and systemic illness.  Sometimes there is a family history of chronic urticaria.   "Physical urticarias" may be caused by heat, sun, cold, vibration.   Insect bites can cause "papular urticaria". It is often difficult to find the cause of generalized hives.  Statistically, 70% of the time a cause of generalized hives is not found.  Sometimes hives can spontaneously resolve. Other times hives can persist and when it does, and no cause is found, and it has been at least 6 weeks since started, it is called "chronic idiopathic urticaria". Antihistamines are the mainstay for treatment.  In severe cases Xolair injections may be used.  Recommend when patient takes the hydroxyzine in the evening  she should work from home the following day. If she is working in the office the next day, she should take it earlier but give herself enough time to get home before getting drowsy.   BP before Xolair - 114/77 BP 15 minutes after Xolair - 115/81 BP 30 minutes after Xolair -  117/78  Cont Famotidine 20mg  bid as prescribed by Ellsworth Lennox, PA-C Cont Claritin up to 4 po qd, 2 in the morning and 2 in the afternoon. Cont Hydroxyzine 10mg  1 po at bedtime as needed. Patient is only taking about once a week.  Increase Xolair 600 mg/85mL (300 mg/32mL x 2) sq injections today.  Xolair 600mg /19ml sq injections (300 mg/63mL x 2) to R and L upper arm, samples x 2 Lot [id number], exp 11/2023 [facility] 45409-811-91  Discussed getting another opinion from another allergist at [facility] or [facility], Discussed referral to  Dr Karren Burly in [place]. Will discuss more on follow-up if not improving with increased dose of Xolair.  Reviewed referral note from allergist 09/26/2022. Patient only had positive reaction to [place]. No significant changes to plan, other than increasing Allegra to twice daily. Patient felt like she had increased bumps, so she switched back to Claritin and increased to 4x/day.   Related Medications famotidine (PEPCID) 20 MG tablet Take 1 tablet (20 mg total) by mouth 2 (two) times daily.  omalizumab Geoffry Paradise) injection 600 mg   At least 45 minutes spent today in evaluation and treatment of patient.  Return in about 1 month (around 03/01/2023) for Xolair.  Wendee Beavers, CMA, am acting as scribe for [name], MD . Documentation: I have reviewed the above documentation for accuracy and completeness, and I agree with the above.  [name], MD

## 2023-01-30 NOTE — Patient Instructions (Signed)

## 2023-02-02 ENCOUNTER — Encounter: Payer: Self-pay | Admitting: Dermatology

## 2023-02-03 MED ORDER — OMALIZUMAB 300 MG/2ML ~~LOC~~ SOAJ
600.0000 mg | Freq: Once | SUBCUTANEOUS | Status: AC
Start: 2023-02-03 — End: 2023-01-30
  Administered 2023-01-30: 600 mg via SUBCUTANEOUS

## 2023-03-03 ENCOUNTER — Telehealth: Payer: Self-pay

## 2023-03-03 NOTE — Telephone Encounter (Signed)
Patient is scheduled for Xolair injections Thursday afternoon.  Patient is asking can we do nurse visit and schedule one month follow up with you?

## 2023-03-04 NOTE — Telephone Encounter (Signed)
Nurse visit scheduled. aw

## 2023-03-06 ENCOUNTER — Ambulatory Visit: Payer: BC Managed Care – PPO | Admitting: Dermatology

## 2023-03-06 ENCOUNTER — Ambulatory Visit: Payer: BC Managed Care – PPO

## 2023-03-06 DIAGNOSIS — L509 Urticaria, unspecified: Secondary | ICD-10-CM

## 2023-03-06 NOTE — Progress Notes (Signed)
Patient here today for 4 week Xolair injections.  Xolair 600mg  (2X 300mg  pens) injected into left and right arm. Patient tolerated well.   1st BP before: 121/76 2nd BP 15 minutes after: 124/77 3rd BP 30 minutes after: 108/86  LOT: [id number] EXP: APR 2025 NDC: 29562-130-86  Cassandra Pham, RMA

## 2023-03-12 MED ORDER — OMALIZUMAB 300 MG/2ML ~~LOC~~ SOAJ
300.0000 mg | Freq: Once | SUBCUTANEOUS | Status: AC
Start: 2023-03-12 — End: 2023-03-06
  Administered 2023-03-06: 300 mg via SUBCUTANEOUS

## 2023-03-12 NOTE — Addendum Note (Signed)
Addended by: Dorathy Daft R on: 03/12/2023 10:38 AM   Modules accepted: Orders

## 2023-03-31 ENCOUNTER — Other Ambulatory Visit: Payer: Self-pay | Admitting: Obstetrics and Gynecology

## 2023-03-31 DIAGNOSIS — Z1231 Encounter for screening mammogram for malignant neoplasm of breast: Secondary | ICD-10-CM

## 2023-04-03 ENCOUNTER — Ambulatory Visit: Payer: BC Managed Care – PPO | Admitting: Dermatology

## 2023-04-08 ENCOUNTER — Ambulatory Visit: Payer: BC Managed Care – PPO | Admitting: Dermatology

## 2023-04-08 DIAGNOSIS — L501 Idiopathic urticaria: Secondary | ICD-10-CM

## 2023-04-08 DIAGNOSIS — Z7189 Other specified counseling: Secondary | ICD-10-CM

## 2023-04-08 DIAGNOSIS — L509 Urticaria, unspecified: Secondary | ICD-10-CM | POA: Diagnosis not present

## 2023-04-08 DIAGNOSIS — Z79899 Other long term (current) drug therapy: Secondary | ICD-10-CM

## 2023-04-08 MED ORDER — OMALIZUMAB 300 MG/2ML ~~LOC~~ SOAJ
300.0000 mg | Freq: Once | SUBCUTANEOUS | Status: AC
  Administered 2023-04-08: 300 mg via SUBCUTANEOUS

## 2023-04-08 MED ORDER — OMALIZUMAB 300 MG/2ML ~~LOC~~ SOAJ
300.0000 mg | Freq: Once | SUBCUTANEOUS | Status: AC
Start: 2023-04-08 — End: 2023-04-08
  Administered 2023-04-08: 300 mg via SUBCUTANEOUS

## 2023-04-08 NOTE — Progress Notes (Signed)
   Follow-Up Visit   Subjective  Cassandra Pham is a 47 y.o. female who presents for the following: Urticaria, body, 4 wk f/u, pt feels like the Xolair 600mg  has decreased frequency of hives, Famotidine 20mg   bid, Claritin 1- 4 po qd, 2 in the am, 2 in the afternoon, Hydroxyzine 10mg  1 po prn The patient has spots, moles and lesions to be evaluated, some may be new or changing and the patient may have concern these could be cancer.   The following portions of the chart were reviewed this encounter and updated as appropriate: medications, allergies, medical history  Review of Systems:  No other skin or systemic complaints except as noted in HPI or Assessment and Plan.  Objective  Well appearing patient in no apparent distress; mood and affect are within normal limits.  All findings within normal limits unless otherwise noted below.  A focused examination was performed of the following areas:face; arms;legs   Relevant exam findings are noted in the Assessment and Plan.    Assessment & Plan   URTICARIA Body BP before Xolair 3:17 115/77 BP after Xolair 3:29  121/77 Exam: clear today  Chronic and persistent condition with duration or expected duration over one year. Condition is improving with treatment but not currently at goal.    Urticaria or hives is a pink to red patchy whelp- like rash of the skin that typically itches and it is the result of histamine release in the skin.   Hives may have multiple causes including stress, medications, infections, and systemic illness.  Sometimes there is a family history of chronic urticaria.   "Physical urticarias" may be caused by pressure (dermatographism), heat, sun, cold, vibration.  Insect bites can cause "papular urticaria". It is often difficult to find the cause of generalized hives.  Statistically, 70% of the time a cause of generalized hives is not found.  Sometimes hives can spontaneously resolve. Other times hives can persist and  when it does, and no cause is found, and it has been at least 6 weeks since started, it is called "chronic idiopathic urticaria". Antihistamines are the mainstay for treatment.  In severe cases Xolair injections may be used.   Treatment Plan: Cont Famotidine 20mg  bid as prescribed by Ellsworth Lennox, PA-C Cont Claritin up to 4 po qd, 2 in the morning and 2 in the afternoon. Cont Hydroxyzine 10mg  1 po at bedtime as needed.  Cont Xolair 600mg  sq injections q month Xolair 300 sq injections x 2 today to R and L ant thigh, Lot [id number] exp 11/2023 (Patient was taught how to self administer and gave injection in L ant thigh)  Urticaria  Related Medications famotidine (PEPCID) 20 MG tablet Take 1 tablet (20 mg total) by mouth 2 (two) times daily.  omalizumab Geoffry Paradise) injection 300 mg   omalizumab Geoffry Paradise) injection 300 mg     Return in about 3 months (around 07/09/2023).  I, Ardis Rowan, RMA, am acting as scribe for [name], MD .   Documentation: I have reviewed the above documentation for accuracy and completeness, and I agree with the above.  [name], MD

## 2023-04-08 NOTE — Patient Instructions (Signed)

## 2023-04-15 ENCOUNTER — Encounter: Payer: Self-pay | Admitting: Dermatology

## 2023-05-01 ENCOUNTER — Ambulatory Visit
Admission: RE | Admit: 2023-05-01 | Discharge: 2023-05-01 | Disposition: A | Discharge status: Home / Self Care | Payer: BC Managed Care – PPO | Source: Ambulatory Visit | Attending: Obstetrics and Gynecology | Admitting: Obstetrics and Gynecology

## 2023-05-01 DIAGNOSIS — Z1231 Encounter for screening mammogram for malignant neoplasm of breast: Secondary | ICD-10-CM | POA: Diagnosis not present

## 2023-05-01 NOTE — Progress Notes (Signed)
Virtual Visit via Video Note  Consent was obtained for video visit:  Yes.   Answered questions that patient had about telehealth interaction:  Yes.   I discussed the limitations, risks, security and privacy concerns of performing an evaluation and management service by telemedicine. I also discussed with the patient that there may be a patient responsible charge related to this service. The patient expressed understanding and agreed to proceed.  Pt location: Home Physician Location: office Name of referring provider:  Copland, Ilona Sorrel, PA-C I connected with Sharl Ma at patients initiation/request on 05/02/2023 at  2:30 PM EST by video enabled telemedicine application and verified that I am speaking with the correct person using two identifiers. Pt MRN:  [id number] Pt DOB:  [birth year] Video Participants:  Sharl Ma   Assessment/Plan:   Migraine without aura, without status migrainosus, not intractable    Migraine prevention:  nortriptyline 50mg  at bedtime  Migraine rescue: Excedrin Migraine.  Limit use of pain relievers to no more than 2 days out of week to prevent risk of rebound or medication-overuse headache. Keep headache diary Follow up 1 year       Subjective:  Cassandra Pham is a 47 year old right-handed female with asthma who follows up for migraines.   UPDATE: Doing well Intensity:  2-5/10 if treats immediately, otherwise 10/10 if severe Duration:  2 hours with Excedrin.   Frequency:  Twice a month (one is severe) Current NSAIDS/analgesics:  Excedrin Migraine Current triptans:  none Current ergotamine:  none Current anti-emetic:  none Current muscle relaxants:  none Current Antihypertensive medications:  none Current Antidepressant medications:  nortriptyline 50mg  QHS, sertraline Current Anticonvulsant medications:  none Current anti-CGRP:  none Current Vitamins/Herbal/Supplements:  none Current Antihistamines/Decongestants:  none Other therapy:   none Hormone/birth control:  none   Caffeine:  1 cup coffee daily Alcohol:  occasional Smoker:  no Diet:  Does not drink much water during winter.  Drinks Sprite.  Skips meals Exercise:  no Depression/Anxiety:  yes Other pain:  no Sleep hygiene:  varies   HISTORY:  Onset:  Off an on for several years.  More consistent around 47 years old, worse over past couple of months.  Got new prescription a couple of months ago and now vision is off Location:  both temples Quality:  pressure/sharp/pounding Intensity:  2-5/10 if treats immediately, otherwise 10/10 if severe Aura:  absent Prodrome:  absent Associated symptoms:  Photophobia, phonophobia.  Usually no nausea but rarely.  She denies associated vomiting, visual disturbance, unilateral numbness or weakness. Duration:  2 hours.  Recently had one lasting a day. Frequency:  Once a week, severe occurring every other week.  Frequency of abortive medication: 2-3 days a week Triggers:  stress, hormone Relieving factors:  Excedrin Migraine, sleep Activity:  If severe, cannot function       Past NSAIDS/analgesics:  diclofenac 75mg , acetaminophen, ibuprofen 800mg , Toradol shot (helps) Past abortive triptans:  sumatriptan tab, rizatriptan  Past abortive ergotamine:  none Past muscle relaxants:  cyclobenzaprine, tizanidine Past anti-emetic:  ondansetron 4mg , promethazine Past antihypertensive medications:  none Past antidepressant medications:  none Past anticonvulsant medications:  none Past anti-CGRP:  none Past vitamins/Herbal/Supplements:  B12, D Past antihistamines/decongestants:  none Other past therapies:  none     Family history of headache:  no  Past Medical History: Past Medical History:  Diagnosis Date   Anxiety and depression    Asthma    Bacterial vaginosis    BRCA negative 02/2020  MyRisk neg except NTHL1 VUS; IBIS=7.4%/riskscore=7.2%   Family history of ovarian cancer 02/2019   MyRisk testing declined   Family  history of pancreatic cancer    Medical history non-contributory    Migraine    Urticaria    UTI (urinary tract infection)     Medications: Outpatient Encounter Medications as of 05/02/2023  Medication Sig   aspirin-acetaminophen-caffeine (EXCEDRIN MIGRAINE) 250-250-65 MG tablet Take by mouth every 6 (six) hours as needed for headache.   famotidine (PEPCID) 20 MG tablet Take 1 tablet (20 mg total) by mouth 2 (two) times daily.   fexofenadine (ALLEGRA ALLERGY) 180 MG tablet Take 1-2 tablets twice a day for rash/hives/itching.   ibuprofen (ADVIL) 800 MG tablet Take 1 tablet (800 mg total) by mouth every 8 (eight) hours as needed.   montelukast (SINGULAIR) 10 MG tablet Take 1 tablet (10 mg total) by mouth at bedtime.   nitrofurantoin, macrocrystal-monohydrate, (MACROBID) 100 MG capsule Take 1 capsule (100 mg total) by mouth 2 (two) times daily.   omalizumab (XOLAIR) 300 MG/2  ML pen Inject 600 mg into the skin every 30 (thirty) days.   No facility-administered encounter medications on file as of 05/02/2023.    Allergies: Allergies  Allergen Reactions   Ginger Hives   Amoxil [Amoxicillin] Hives    Family History: Family History  Problem Relation Age of Onset   Hypertension Mother    Asthma Father    Allergic rhinitis Father    Diabetes Father        DM Type 2   Hypercholesterolemia Father    Ovarian cancer Maternal Aunt 40   Bone cancer Maternal Uncle 45   Breast cancer Paternal Aunt 71   Pancreatic cancer Maternal Grandmother 60   Breast cancer Cousin 40   Eczema Daughter    Urticaria Neg Hx     Observations/Objective:   No acute distress.  Alert and oriented.  Speech fluent and not dysarthric.  Language intact.  Eyes orthophoric on primary gaze.  Face symmetric.   Follow Up Instructions:    -I discussed the assessment and treatment plan with the patient. The patient was provided an opportunity to ask questions and all were answered. The patient agreed with the plan and  demonstrated an understanding of the instructions.   The patient was advised to call back or seek an in-person evaluation if the symptoms worsen or if the condition fails to improve as anticipated.   Cira Servant, DO  CC: Althea Grimmer, PA-C

## 2023-05-02 ENCOUNTER — Telehealth (INDEPENDENT_AMBULATORY_CARE_PROVIDER_SITE_OTHER): Payer: BC Managed Care – PPO | Admitting: Neurology

## 2023-05-02 ENCOUNTER — Encounter: Payer: Self-pay | Admitting: Neurology

## 2023-05-02 DIAGNOSIS — G43009 Migraine without aura, not intractable, without status migrainosus: Secondary | ICD-10-CM | POA: Diagnosis not present

## 2023-05-02 MED ORDER — NORTRIPTYLINE HCL 50 MG PO CAPS: 50.0000 mg | ORAL_CAPSULE | Freq: Every day | ORAL | 3 refills | Status: DC

## 2023-06-02 ENCOUNTER — Encounter: Payer: Self-pay | Admitting: Dermatology

## 2023-07-02 NOTE — Telephone Encounter (Signed)
 Spoke with Cassandra Pham yesterday regarding her coverage with [employer]. She does have coverage but her copay is $1,000. Patient is needing to be signed up for copay assistance. Per Monda they have asked can we obtain patient's out of pocket and fax in. I will continue to work on this. aw

## 2023-07-09 ENCOUNTER — Other Ambulatory Visit (HOSPITAL_COMMUNITY)
Admission: RE | Admit: 2023-07-09 | Discharge: 2023-07-09 | Disposition: A | Discharge status: Home / Self Care | Payer: BC Managed Care – PPO | Source: Ambulatory Visit

## 2023-07-09 ENCOUNTER — Ambulatory Visit: Payer: BC Managed Care – PPO

## 2023-07-09 ENCOUNTER — Ambulatory Visit: Payer: BC Managed Care – PPO | Admitting: Certified Nurse Midwife

## 2023-07-09 DIAGNOSIS — N898 Other specified noninflammatory disorders of vagina: Secondary | ICD-10-CM | POA: Diagnosis not present

## 2023-07-09 NOTE — Assessment & Plan Note (Addendum)
Will treat based on results from swab Reviewed upcoming annual appointment

## 2023-07-09 NOTE — Progress Notes (Addendum)
   Established Patient Office Visit  Subjective   Patient ID: Cassandra Pham, female    DOB: 1975/08/22  Age: 48 y.o. MRN: [id number]    Assessment & Plan:   Problem List Items Addressed This Visit       Other   Vaginal discharge   Will treat based on results from swab Reviewed upcoming annual appointment      Other Visit Diagnoses       Vaginal odor    -  Primary   Relevant Orders   Cervicovaginal ancillary only      HPI-  Complaints of yellow vaginal discharge for the past few days that has an odor. Also endorses itching.   PE- Declined. Patient to self swab  Oley Balm, CNM

## 2023-07-10 ENCOUNTER — Telehealth: Payer: Self-pay

## 2023-07-10 NOTE — Telephone Encounter (Signed)
[facility] referral sent. aw

## 2023-07-10 NOTE — Telephone Encounter (Signed)
Patient's Xolair is covered 100% through her [employer] Medical benefits. She is past due to Xolair injections through pharmacy benefits and having a hard time getting copay assistance.   We could send referral to [facility] infusion center for Xolair injection and per representative, this would be covered with no charge. Are you okay if I send referral for Xolair to [facility] in [place]?

## 2023-07-11 LAB — CERVICOVAGINAL ANCILLARY ONLY
Bacterial Vaginitis (gardnerella): POSITIVE — AB
Candida Glabrata: NEGATIVE
Candida Vaginitis: NEGATIVE
Chlamydia: NEGATIVE
Comment: NEGATIVE
Comment: NEGATIVE
Comment: NEGATIVE
Comment: NEGATIVE
Comment: NEGATIVE
Comment: NORMAL
Neisseria Gonorrhea: NEGATIVE
Trichomonas: POSITIVE — AB

## 2023-07-14 ENCOUNTER — Other Ambulatory Visit: Payer: Self-pay

## 2023-07-14 MED ORDER — METRONIDAZOLE 500 MG PO TABS: ORAL_TABLET | ORAL | 0 refills | Status: DC

## 2023-07-14 MED ORDER — METRONIDAZOLE 0.75 % VA GEL: 1.0000 | Freq: Two times a day (BID) | VAGINAL | 0 refills | Status: DC

## 2023-07-15 ENCOUNTER — Ambulatory Visit: Payer: BC Managed Care – PPO | Admitting: Obstetrics and Gynecology

## 2023-07-29 ENCOUNTER — Telehealth: Payer: Self-pay

## 2023-07-29 NOTE — Telephone Encounter (Signed)
 Patient has been approved through [place] for patient assistance. Spoke with pharmacist today and confirmed patients OK to inject at home. aw

## 2023-08-04 ENCOUNTER — Telehealth: Payer: Self-pay

## 2023-08-04 ENCOUNTER — Ambulatory Visit (INDEPENDENT_AMBULATORY_CARE_PROVIDER_SITE_OTHER): Admitting: Dermatology

## 2023-08-04 ENCOUNTER — Encounter: Payer: Self-pay | Admitting: Obstetrics and Gynecology

## 2023-08-04 DIAGNOSIS — Z7189 Other specified counseling: Secondary | ICD-10-CM | POA: Diagnosis not present

## 2023-08-04 DIAGNOSIS — A599 Trichomoniasis, unspecified: Secondary | ICD-10-CM | POA: Insufficient documentation

## 2023-08-04 DIAGNOSIS — Z79899 Other long term (current) drug therapy: Secondary | ICD-10-CM | POA: Diagnosis not present

## 2023-08-04 DIAGNOSIS — L501 Idiopathic urticaria: Secondary | ICD-10-CM

## 2023-08-04 DIAGNOSIS — Z803 Family history of malignant neoplasm of breast: Secondary | ICD-10-CM | POA: Insufficient documentation

## 2023-08-04 MED ORDER — OMALIZUMAB 150 MG/ML ~~LOC~~ SOSY
150.0000 mg | PREFILLED_SYRINGE | Freq: Once | SUBCUTANEOUS | Status: AC
  Administered 2023-08-04: 150 mg via SUBCUTANEOUS

## 2023-08-04 MED ORDER — OMALIZUMAB 150 MG/ML ~~LOC~~ SOSY
150.0000 mg | PREFILLED_SYRINGE | Freq: Once | SUBCUTANEOUS | Status: AC
Start: 2023-08-04 — End: 2023-08-04
  Administered 2023-08-04: 150 mg via SUBCUTANEOUS

## 2023-08-04 NOTE — Progress Notes (Unsigned)
 PCP:  Rica Records, PA-C   No chief complaint on file.    HPI:      [name] is a 48 y.o. [id number] who LMP was No LMP recorded. Patient has had an ablation., presents today for her annual examination.  Her menses are regular every 28-30 days, lasting 3 days of spotting/dark d/c only. Dysmenorrhea mild, occurring first 1-2 days of flow. She does not have intermenstrual bleeding. Menses significantly improved after endometrial ablation 12/19 with DR. Harris.  Sex activity: single partner, contraception - tubal ligation.  Last Pap: 02/05/16  Results were: no abnormalities /neg HPV DNA  Hx of STDs: trich  Treatd for BV and trich 2/25, due for [facility]  Last mammogram: 05/01/23 Results were: normal--routine follow-up in 12 months There is a FH of breast cancer in her mat aunt. There is a FH of ovarian cancer in her mat aunt and pancreatic cancer in her MGM.  Pt is MyRisk neg except NTHL1 VUS 10/21; IBIS=7.4%/riskscore=7.2%. The patient does do self-breast exams. Has had LT breast tenderness this cycle. Minimal caffeine use. No masses.   Tobacco use: The patient denies current or previous tobacco use. Alcohol use: social drinker No drug use.  Exercise: not active  Colonoscopy: never  She does not get adequate calcium or get Vitamin D in her diet. Labs with PCP. Has had issues with fatigue past few months. Sleeps 8-9 hrs nightly, uninterrupted sleep. Denies depression sx. No recent covid sx. Had labs with PCP 4/21 but TSH not done. Pt tried oral B12 tabs without success. Not exercising.  Had UTI a couple wks ago with neg C&S. Pt had taken leftover abx before C&S sent. Completed 7 days macrobid with sx relief.   Past Medical History:  Diagnosis Date   Anxiety and depression    Asthma    Bacterial vaginosis    BRCA negative 02/2020   MyRisk neg except NTHL1 VUS; IBIS=7.4%/riskscore=7.2%   Family history of ovarian cancer 02/2019   MyRisk testing declined   Family history of  pancreatic cancer    Medical history non-contributory    Migraine    Urticaria    UTI (urinary tract infection)     Past Surgical History:  Procedure Laterality Date   ABLATION     APPENDECTOMY     ESOPHAGOGASTRODUODENOSCOPY (EGD) WITH PROPOFOL N/A 10/05/2015   Procedure: ESOPHAGOGASTRODUODENOSCOPY (EGD) WITH PROPOFOL;  Surgeon: Wallace Cullens, MD;  Location: [facility] ENDOSCOPY;  Service: Gastroenterology;  Laterality: N/A;   TUBAL LIGATION      Family History  Problem Relation Age of Onset   Hypertension Mother    Asthma Father    Allergic rhinitis Father    Diabetes Father        DM Type 2   Hypercholesterolemia Father    Ovarian cancer Maternal Aunt 40   Bone cancer Maternal Uncle 42   Breast cancer Paternal Aunt 33   Pancreatic cancer Maternal Grandmother 55   Breast cancer Cousin 40   Eczema Daughter    Urticaria Neg Hx     Social History   Socioeconomic History   Marital status: Single    Spouse name: Not on file   Number of children: Not on file   Years of education: Not on file   Highest education level: Not on file  Occupational History   Not on file  Tobacco Use   Smoking status: Never   Smokeless tobacco: Never  Vaping Use   Vaping status: Never  Used  Substance and Sexual Activity   Alcohol use: Yes    Comment: occ.   Drug use: No   Sexual activity: Yes    Birth control/protection: Surgical    Comment: tubal ligation  Other Topics Concern   Not on file  Social History Narrative   Single.   4 children.   Works as a Public house manager.   Enjoys spending time with family and friends.       Right handed   Social Drivers of Health   Financial Resource Strain: Not on file  Food Insecurity: Not on file  Transportation Needs: Not on file  Physical Activity: Not on file  Stress: Not on file  Social Connections: Not on file  Intimate Partner Violence: Not on file    No outpatient medications have been marked as taking for the 08/05/23 encounter  (Appointment) with Oather Muilenburg, Helmut Muster B, PA-C.     ROS:  Review of Systems  Constitutional:  Positive for fatigue. Negative for fever and unexpected weight change.  Respiratory:  Negative for cough, shortness of breath and wheezing.   Cardiovascular:  Negative for chest pain, palpitations and leg swelling.  Gastrointestinal:  Negative for blood in stool, constipation, diarrhea, nausea and vomiting.  Endocrine: Negative for cold intolerance, heat intolerance and polyuria.  Genitourinary:  Positive for dysuria. Negative for dyspareunia, flank pain, frequency, genital sores, hematuria, menstrual problem, pelvic pain, urgency, vaginal bleeding, vaginal discharge and vaginal pain.  Musculoskeletal:  Negative for back pain, joint swelling and myalgias.  Skin:  Negative for rash.  Neurological:  Negative for dizziness, syncope, light-headedness, numbness and headaches.  Hematological:  Negative for adenopathy.  Psychiatric/Behavioral:  Positive for agitation. Negative for confusion, sleep disturbance and suicidal ideas. The patient is not nervous/anxious.      Objective: There were no vitals taken for this visit.   Physical Exam Constitutional:      Appearance: She is well-developed.  Genitourinary:     Vulva normal.     No vaginal discharge, erythema or tenderness.      Right Adnexa: not tender and no mass present.    Left Adnexa: not tender and no mass present.    No cervical motion tenderness or polyp.     Uterus is not enlarged or tender.  Breasts:    Right: No mass, nipple discharge, skin change or tenderness.     Left: No mass, nipple discharge, skin change or tenderness.  Neck:     Thyroid: No thyromegaly.  Cardiovascular:     Rate and Rhythm: Normal rate and regular rhythm.     Heart sounds: Normal heart sounds. No murmur heard. Pulmonary:     Effort: Pulmonary effort is normal.     Breath sounds: Normal breath sounds.  Abdominal:     Palpations: Abdomen is soft.      Tenderness: There is no abdominal tenderness. There is no guarding.  Musculoskeletal:        General: Normal range of motion.     Cervical back: Normal range of motion.  Neurological:     General: No focal deficit present.     Mental Status: She is alert and oriented to person, place, and time.     Cranial Nerves: No cranial nerve deficit.  Skin:    General: Skin is warm and dry.  Psychiatric:        Mood and Affect: Mood normal.        Behavior: Behavior normal.  Thought Content: Thought content normal.        Judgment: Judgment normal.  Vitals reviewed.     Assessment/Plan: Encounter for annual routine gynecological examination  Encounter for screening mammogram for malignant neoplasm of breast; pt current on mammo  Family history of ovarian cancer - Plan: [facility]); My Risk testing discussed and done today. Will call with results.   Family history of pancreatic cancer - Plan: [facility] ([facility])  Screening for colon cancer--colonoscopy/cologuard discussed. Pt declines testing for now, wants to think about it. Will f/u prn.   Blood tests for routine general physical examination - Plan: TSH + free T4, B12 and Folate Panel  Chronic fatigue--check labs. Will call with results. Increase exercise.   Thyroid disorder screening - Plan: TSH + free T4  Screening for deficiency anemia - Plan: B12 and Folate Panel   GYN counsel mammography screening, adequate intake of calcium and vitamin D, diet and exercise     F/U  No follow-ups on file./1 yr annual.  Custer Pimenta B. Auburn Hester, PA-C 08/04/2023 12:48 PM

## 2023-08-04 NOTE — Progress Notes (Unsigned)
   Follow-Up Visit   Subjective  Cassandra Pham is a 48 y.o. female who presents for the following: urticaria  - pt has been flaring since she has been out of Xolair since January, pt c/o significant rash and itching. Currently she is using Singulair 10mg  po QHS, famotidine 20 mg po BID, Hydroxyzine 10 mg po QHS PRN, and Claritin up to 4 po qd, 2 in the morning and 2 in the afternoon. Pt has been approved for patient assistance and should be receiving Xolair on Wednesday.   The patient has spots, moles and lesions to be evaluated, some may be new or changing and the patient may have concern these could be cancer.  The following portions of the chart were reviewed this encounter and updated as appropriate: medications, allergies, medical history  Review of Systems:  No other skin or systemic complaints except as noted in HPI or Assessment and Plan.  Objective  Well appearing patient in no apparent distress; mood and affect are within normal limits.   A focused examination was performed of the following areas: the face and extremities  Relevant exam findings are noted in the Assessment and Plan.   Assessment & Plan   URTICARIA Exam: edematous pink papules and plaques +/- dermatographism  Chronic and persistent condition with duration or expected duration over one year. Condition is bothersome/symptomatic for patient. Currently flared.  Urticaria or hives is a pink to red patchy whelp- like rash of the skin that typically itches and it is the result of histamine release in the skin.   Hives may have multiple causes including stress, medications, infections, and systemic illness.  Sometimes there is a family history of chronic urticaria.   "Physical urticarias" may be caused by pressure (dermatographism), heat, sun, cold, vibration.  Insect bites can cause "papular urticaria". It is often difficult to find the cause of generalized hives.  Statistically, 70% of the time a cause of  generalized hives is not found.  Sometimes hives can spontaneously resolve. Other times hives can persist and when it does, and no cause is found, and it has been at least 6 weeks since started, it is called "chronic idiopathic urticaria". Antihistamines are the mainstay for treatment.  In severe cases Xolair injections may be used.   Treatment Plan: Cont Famotidine 20mg  bid as prescribed by Ellsworth Lennox, PA-C Cont Claritin up to 4 po qd, 2 in the morning and 2 in the afternoon. Cont Hydroxyzine 10mg  1 po at bedtime as needed.  Cont Xolair 600mg  sq injections q month Xolair 150mg /mL sq injections x 4 today to R upper arm, L upper arm, L ant thigh, and R ant thigh. Patient tolerated injections well. AL, CMA IDIOPATHIC URTICARIA   Related Medications omalizumab Geoffry Paradise) prefilled syringe 150 mg  omalizumab Geoffry Paradise) prefilled syringe 150 mg  omalizumab Geoffry Paradise) prefilled syringe 150 mg  omalizumab Geoffry Paradise) prefilled syringe 150 mg   Return in about 6 months (around 02/04/2024) for urticaria follow up.  Cassandra Pham, CMA, am acting as scribe for [name], MD .  Documentation: I have reviewed the above documentation for accuracy and completeness, and I agree with the above.  [name], MD

## 2023-08-04 NOTE — Telephone Encounter (Signed)
 Spoke with patient today regarding Xolair. BCBS out of pocket has been faxed to [employer]. Patient needs to get pharmacy benefit out of pocket max dollar amount to [place] and then they can finish patient assistance application. Called patient and advised her of information. Aw  Xolair is currently approved through pharmacy benefits but out of pocket cost is $1,000. aw

## 2023-08-04 NOTE — Patient Instructions (Signed)

## 2023-08-05 ENCOUNTER — Other Ambulatory Visit (HOSPITAL_COMMUNITY)
Admission: RE | Admit: 2023-08-05 | Discharge: 2023-08-05 | Disposition: A | Discharge status: Home / Self Care | Source: Ambulatory Visit | Attending: Obstetrics and Gynecology | Admitting: Obstetrics and Gynecology

## 2023-08-05 ENCOUNTER — Ambulatory Visit (INDEPENDENT_AMBULATORY_CARE_PROVIDER_SITE_OTHER): Payer: BC Managed Care – PPO | Admitting: Obstetrics and Gynecology

## 2023-08-05 ENCOUNTER — Encounter: Payer: Self-pay | Admitting: Dermatology

## 2023-08-05 ENCOUNTER — Encounter: Payer: Self-pay | Admitting: Obstetrics and Gynecology

## 2023-08-05 VITALS — BP 98/72 | Ht 60.0 in | Wt 133.0 lb

## 2023-08-05 DIAGNOSIS — Z01419 Encounter for gynecological examination (general) (routine) without abnormal findings: Secondary | ICD-10-CM

## 2023-08-05 DIAGNOSIS — Z8041 Family history of malignant neoplasm of ovary: Secondary | ICD-10-CM

## 2023-08-05 DIAGNOSIS — Z803 Family history of malignant neoplasm of breast: Secondary | ICD-10-CM

## 2023-08-05 DIAGNOSIS — R109 Unspecified abdominal pain: Secondary | ICD-10-CM

## 2023-08-05 DIAGNOSIS — Z1321 Encounter for screening for nutritional disorder: Secondary | ICD-10-CM

## 2023-08-05 DIAGNOSIS — A599 Trichomoniasis, unspecified: Secondary | ICD-10-CM | POA: Diagnosis not present

## 2023-08-05 DIAGNOSIS — Z1151 Encounter for screening for human papillomavirus (HPV): Secondary | ICD-10-CM

## 2023-08-05 DIAGNOSIS — R5383 Other fatigue: Secondary | ICD-10-CM | POA: Diagnosis not present

## 2023-08-05 DIAGNOSIS — R5382 Chronic fatigue, unspecified: Secondary | ICD-10-CM

## 2023-08-05 DIAGNOSIS — Z1329 Encounter for screening for other suspected endocrine disorder: Secondary | ICD-10-CM

## 2023-08-05 DIAGNOSIS — Z124 Encounter for screening for malignant neoplasm of cervix: Secondary | ICD-10-CM | POA: Diagnosis not present

## 2023-08-05 DIAGNOSIS — Z113 Encounter for screening for infections with a predominantly sexual mode of transmission: Secondary | ICD-10-CM | POA: Insufficient documentation

## 2023-08-05 DIAGNOSIS — Z0000 Encounter for general adult medical examination without abnormal findings: Secondary | ICD-10-CM

## 2023-08-05 DIAGNOSIS — Z1211 Encounter for screening for malignant neoplasm of colon: Secondary | ICD-10-CM

## 2023-08-05 DIAGNOSIS — Z1231 Encounter for screening mammogram for malignant neoplasm of breast: Secondary | ICD-10-CM

## 2023-08-05 NOTE — Patient Instructions (Signed)
 I value your feedback and you entrusting [place] with your care. If you get a [place] patient survey, I would appreciate you taking the time to let us know about your experience today. Thank you! ? ? ?

## 2023-08-06 LAB — COMPREHENSIVE METABOLIC PANEL
ALT: 21 IU/L (ref 0–32)
AST: 28 IU/L (ref 0–40)
Albumin: 4.3 g/dL (ref 3.9–4.9)
Alkaline Phosphatase: 64 IU/L (ref 44–121)
BUN/Creatinine Ratio: 12 (ref 9–23)
BUN: 11 mg/dL (ref 6–24)
Bilirubin Total: 0.7 mg/dL (ref 0.0–1.2)
CO2: 22 mmol/L (ref 20–29)
Calcium: 9.4 mg/dL (ref 8.7–10.2)
Chloride: 103 mmol/L (ref 96–106)
Creatinine, Ser: 0.91 mg/dL (ref 0.57–1.00)
Globulin, Total: 3 g/dL (ref 1.5–4.5)
Glucose: 68 mg/dL — ABNORMAL LOW (ref 70–99)
Potassium: 4.4 mmol/L (ref 3.5–5.2)
Sodium: 140 mmol/L (ref 134–144)
Total Protein: 7.3 g/dL (ref 6.0–8.5)
eGFR: 77 mL/min/{1.73_m2} (ref >59)

## 2023-08-06 LAB — CBC WITH DIFFERENTIAL/PLATELET
Basophils Absolute: 0 10*3/uL (ref 0.0–0.2)
Basos: 0 %
EOS (ABSOLUTE): 0.4 10*3/uL (ref 0.0–0.4)
Eos: 7 %
Hematocrit: 38.5 % (ref 34.0–46.6)
Hemoglobin: 13 g/dL (ref 11.1–15.9)
Immature Grans (Abs): 0 10*3/uL (ref 0.0–0.1)
Immature Granulocytes: 0 %
Lymphocytes Absolute: 2 10*3/uL (ref 0.7–3.1)
Lymphs: 37 %
MCH: 31.9 pg (ref 26.6–33.0)
MCHC: 33.8 g/dL (ref 31.5–35.7)
MCV: 95 fL (ref 79–97)
Monocytes Absolute: 0.4 10*3/uL (ref 0.1–0.9)
Monocytes: 7 %
Neutrophils Absolute: 2.7 10*3/uL (ref 1.4–7.0)
Neutrophils: 49 %
Platelets: 295 10*3/uL (ref 150–450)
RBC: 4.07 x10E6/uL (ref 3.77–5.28)
RDW: 12.8 % (ref 11.7–15.4)
WBC: 5.6 10*3/uL (ref 3.4–10.8)

## 2023-08-06 LAB — B12 AND FOLATE PANEL
Folate: 14.3 ng/mL (ref >3.0)
Vitamin B-12: 374 pg/mL (ref 232–1245)

## 2023-08-06 LAB — T4, FREE: Free T4: 1.17 ng/dL (ref 0.82–1.77)

## 2023-08-06 LAB — TSH: TSH: 1.8 u[IU]/mL (ref 0.450–4.500)

## 2023-08-07 ENCOUNTER — Ambulatory Visit: Payer: BC Managed Care – PPO | Admitting: Dermatology

## 2023-08-07 ENCOUNTER — Encounter: Payer: Self-pay | Admitting: Obstetrics and Gynecology

## 2023-08-07 LAB — CYTOLOGY - PAP
Comment: NEGATIVE
Comment: NEGATIVE
Diagnosis: NEGATIVE
High risk HPV: NEGATIVE
Trichomonas: NEGATIVE

## 2023-08-08 ENCOUNTER — Telehealth: Payer: Self-pay

## 2023-08-08 NOTE — Telephone Encounter (Signed)
 Please contact pt to schedule colonoscopy. Also a dx of abdominal pain. I did speak with the patient and she stated the pain is not extreme currently and would like to move forward with the colonoscopy first. Please contact pt to set up.

## 2023-08-11 ENCOUNTER — Telehealth: Payer: Self-pay | Admitting: *Deleted

## 2023-08-11 ENCOUNTER — Other Ambulatory Visit: Payer: Self-pay | Admitting: *Deleted

## 2023-08-11 DIAGNOSIS — Z1211 Encounter for screening for malignant neoplasm of colon: Secondary | ICD-10-CM

## 2023-08-11 MED ORDER — NA SULFATE-K SULFATE-MG SULF 17.5-3.13-1.6 GM/177ML PO SOLN: 1.0000 | Freq: Once | ORAL | 0 refills | Status: AC

## 2023-08-11 NOTE — Telephone Encounter (Signed)
 Spoken to patient and is walking into a meeting across the campus. Gave her my direct number to call back when she have time.

## 2023-08-11 NOTE — Telephone Encounter (Signed)
 Gastroenterology Pre-Procedure Review  Request Date: 08/28/2023 Requesting Physician: Dr. Servando Snare  PATIENT REVIEW QUESTIONS: The patient responded to the following health history questions as indicated:    1. Are you having any GI issues? yes (abdominal pain but better) 2. Do you have a personal history of Polyps? no 3. Do you have a family history of Colon Cancer or Polyps? yes (mother had colon cancer) 4. Diabetes Mellitus? no 5. Joint replacements in the past 12 months?no 6. Major health problems in the past 3 months?no 7. Any artificial heart valves, MVP, or defibrillator?no    MEDICATIONS & ALLERGIES:    Patient reports the following regarding taking any anticoagulation/antiplatelet therapy:   Plavix, Coumadin, Eliquis, Xarelto, Lovenox, Pradaxa, Brilinta, or Effient? no Aspirin? no  Patient confirms/reports the following medications:  Current Outpatient Medications  Medication Sig Dispense Refill   aspirin-acetaminophen-caffeine (EXCEDRIN MIGRAINE) 250-250-65 MG tablet Take by mouth every 6 (six) hours as needed for headache.     famotidine (PEPCID) 20 MG tablet Take 1 tablet (20 mg total) by mouth 2 (two) times daily. 60 tablet 1   fexofenadine (ALLEGRA ALLERGY) 180 MG tablet Take 1-2 tablets twice a day for rash/hives/itching. 120 tablet 3   ibuprofen (ADVIL) 800 MG tablet Take 1 tablet (800 mg total) by mouth every 8 (eight) hours as needed. 30 tablet 0   montelukast (SINGULAIR) 10 MG tablet Take 1 tablet (10 mg total) by mouth at bedtime. 30 tablet 3   nortriptyline (PAMELOR) 50 MG capsule Take 1 capsule (50 mg total) by mouth at bedtime. 90 capsule 3   XOLAIR 300 MG/2ML injection      No current facility-administered medications for this visit.    Patient confirms/reports the following allergies:  Allergies  Allergen Reactions   Ginger Hives   Amoxil [Amoxicillin] Hives    No orders of the defined types were placed in this encounter.   AUTHORIZATION  INFORMATION Primary Insurance: 1D#: Group #:  Secondary Insurance: 1D#: Group #:  SCHEDULE INFORMATION: Date: 08/28/2023 Time: Location:  [facility]

## 2023-08-11 NOTE — Telephone Encounter (Signed)
 Colonoscopy schedule with Dr Servando Snare on 08/28/2023

## 2023-08-21 ENCOUNTER — Encounter: Payer: Self-pay | Admitting: Gastroenterology

## 2023-08-28 ENCOUNTER — Other Ambulatory Visit: Payer: Self-pay

## 2023-08-28 ENCOUNTER — Ambulatory Visit
Admission: RE | Admit: 2023-08-28 | Discharge: 2023-08-28 | Disposition: A | Discharge status: Home / Self Care | Attending: Gastroenterology | Admitting: Gastroenterology

## 2023-08-28 ENCOUNTER — Ambulatory Visit: Admitting: Anesthesiology

## 2023-08-28 ENCOUNTER — Encounter: Payer: Self-pay | Admitting: Gastroenterology

## 2023-08-28 ENCOUNTER — Encounter
Admission: RE | Disposition: A | Discharge status: Home / Self Care | Payer: Self-pay | Source: Home / Self Care | Attending: Gastroenterology

## 2023-08-28 DIAGNOSIS — Z1211 Encounter for screening for malignant neoplasm of colon: Secondary | ICD-10-CM | POA: Diagnosis not present

## 2023-08-28 DIAGNOSIS — K5730 Diverticulosis of large intestine without perforation or abscess without bleeding: Secondary | ICD-10-CM | POA: Insufficient documentation

## 2023-08-28 DIAGNOSIS — J45909 Unspecified asthma, uncomplicated: Secondary | ICD-10-CM | POA: Insufficient documentation

## 2023-08-28 DIAGNOSIS — K635 Polyp of colon: Secondary | ICD-10-CM | POA: Diagnosis not present

## 2023-08-28 HISTORY — PX: COLONOSCOPY: SHX5424

## 2023-08-28 HISTORY — PX: POLYPECTOMY: SHX5525

## 2023-08-28 HISTORY — PX: CERVICAL POLYPECTOMY: SHX88

## 2023-08-28 SURGERY — COLONOSCOPY
Anesthesia: General

## 2023-08-28 MED ORDER — PROPOFOL 500 MG/50ML IV EMUL
INTRAVENOUS | Status: DC | PRN
  Administered 2023-08-28: 75 ug/kg/min via INTRAVENOUS

## 2023-08-28 MED ORDER — DEXMEDETOMIDINE HCL IN NACL 80 MCG/20ML IV SOLN
INTRAVENOUS | Status: DC | PRN
  Administered 2023-08-28: 20 ug via INTRAVENOUS

## 2023-08-28 MED ORDER — DEXMEDETOMIDINE HCL IN NACL 80 MCG/20ML IV SOLN
INTRAVENOUS | Status: AC
  Filled 2023-08-28: qty 20

## 2023-08-28 MED ORDER — LIDOCAINE HCL (PF) 2 % IJ SOLN
INTRAMUSCULAR | Status: AC
  Filled 2023-08-28: qty 5

## 2023-08-28 MED ORDER — SODIUM CHLORIDE 0.9 % IV SOLN
INTRAVENOUS | Status: DC
Start: 2023-08-28 — End: 2023-08-28

## 2023-08-28 MED ORDER — LIDOCAINE HCL (CARDIAC) PF 100 MG/5ML IV SOSY
PREFILLED_SYRINGE | INTRAVENOUS | Status: DC | PRN
  Administered 2023-08-28: 60 mg via INTRAVENOUS

## 2023-08-28 MED ORDER — PROPOFOL 1000 MG/100ML IV EMUL
INTRAVENOUS | Status: AC
Start: 2023-08-28 — End: ?
  Filled 2023-08-28: qty 100

## 2023-08-28 MED ORDER — PROPOFOL 10 MG/ML IV BOLUS
INTRAVENOUS | Status: DC | PRN
Start: 2023-08-28 — End: 2023-08-28
  Administered 2023-08-28: 40 mg via INTRAVENOUS
  Administered 2023-08-28: 20 mg via INTRAVENOUS
  Administered 2023-08-28: 40 mg via INTRAVENOUS

## 2023-08-28 NOTE — Anesthesia Preprocedure Evaluation (Addendum)
 Anesthesia Evaluation  Patient identified by MRN, date of birth, ID band Patient awake    Reviewed: Allergy & Precautions, NPO status , Patient's Chart, lab work & pertinent test results  History of Anesthesia Complications Negative for: history of anesthetic complications  Airway Mallampati: I  TM Distance: >3 FB Neck ROM: full    Dental no notable dental hx.    Pulmonary asthma    Pulmonary exam normal        Cardiovascular negative cardio ROS Normal cardiovascular exam     Neuro/Psych  Headaches PSYCHIATRIC DISORDERS Anxiety Depression       GI/Hepatic negative GI ROS, Neg liver ROS,,,  Endo/Other  negative endocrine ROS    Renal/GU negative Renal ROS  negative genitourinary   Musculoskeletal   Abdominal   Peds  Hematology negative hematology ROS (+)   Anesthesia Other Findings Past Medical History: No date: Anxiety and depression No date: Asthma No date: Bacterial vaginosis 02/2020: BRCA negative     Comment:  MyRisk neg except NTHL1 VUS; IBIS=7.4%/riskscore=7.2% 02/2019: Family history of ovarian cancer     Comment:  MyRisk testing declined No date: Family history of pancreatic cancer No date: Medical history non-contributory No date: Migraine No date: Urticaria No date: UTI (urinary tract infection)  Past Surgical History: No date: ABLATION No date: APPENDECTOMY 10/05/2015: ESOPHAGOGASTRODUODENOSCOPY (EGD) WITH PROPOFOL; N/A     Comment:  Procedure: ESOPHAGOGASTRODUODENOSCOPY (EGD) WITH               PROPOFOL;  Surgeon: Wallace Cullens, MD;  Location: [facility]               ENDOSCOPY;  Service: Gastroenterology;  Laterality: N/A; No date: TUBAL LIGATION     Reproductive/Obstetrics negative OB ROS                             Anesthesia Physical Anesthesia Plan  ASA: 2  Anesthesia Plan: General   Post-op Pain Management: Minimal or no pain anticipated   Induction:  Intravenous  PONV Risk Score and Plan: 2 and Propofol infusion and TIVA  Airway Management Planned: Natural Airway and Nasal Cannula  Additional Equipment:   Intra-op Plan:   Post-operative Plan:   Informed Consent: I have reviewed the patients History and Physical, chart, labs and discussed the procedure including the risks, benefits and alternatives for the proposed anesthesia with the patient or authorized representative who has indicated his/her understanding and acceptance.     Dental Advisory Given  Plan Discussed with: Anesthesiologist, CRNA and Surgeon  Anesthesia Plan Comments: (Patient consented for risks of anesthesia including but not limited to:  - adverse reactions to medications - risk of airway placement if required - damage to eyes, teeth, lips or other oral mucosa - nerve damage due to positioning  - sore throat or hoarseness - Damage to heart, brain, nerves, lungs, other parts of body or loss of life  Patient voiced understanding and assent.)       Anesthesia Quick Evaluation

## 2023-08-28 NOTE — Transfer of Care (Signed)
 Immediate Anesthesia Transfer of Care Note  Patient: Cassandra Pham  Procedure(s) Performed: COLONOSCOPY POLYPECTOMY POLYPECTOMY, CERVIX  Patient Location: PACU  Anesthesia Type:General  Level of Consciousness: sedated  Airway & Oxygen Therapy: Patient Spontanous Breathing  Post-op Assessment: Report given to RN and Post -op Vital signs reviewed and stable  Post vital signs: Reviewed and stable  Last Vitals:  Vitals Value Taken Time  BP    Temp    Pulse    Resp    SpO2      Last Pain:  Vitals:   08/28/23 0856  TempSrc: Temporal  PainSc: 0-No pain         Complications: No notable events documented.

## 2023-08-28 NOTE — H&P (Signed)
 Cassandra Minium, MD [facility] [address] [place], [place] [address] Phone: [phone] Fax : [phone]  Primary Care Physician:  Trenda Moots Primary Gastroenterologist:  Dr. Servando Snare  Pre-Procedure History & Physical: HPI:  Cassandra Pham is a 48 y.o. female is here for a screening colonoscopy.   Past Medical History:  Diagnosis Date   Anxiety and depression    Asthma    Bacterial vaginosis    BRCA negative 02/2020   MyRisk neg except NTHL1 VUS; IBIS=7.4%/riskscore=7.2%   Family history of ovarian cancer 02/2019   MyRisk testing declined   Family history of pancreatic cancer    Medical history non-contributory    Migraine    Urticaria    UTI (urinary tract infection)     Past Surgical History:  Procedure Laterality Date   ABLATION     APPENDECTOMY     ESOPHAGOGASTRODUODENOSCOPY (EGD) WITH PROPOFOL N/A 10/05/2015   Procedure: ESOPHAGOGASTRODUODENOSCOPY (EGD) WITH PROPOFOL;  Surgeon: Wallace Cullens, MD;  Location: [facility] ENDOSCOPY;  Service: Gastroenterology;  Laterality: N/A;   TUBAL LIGATION      Prior to Admission medications   Medication Sig Start Date End Date Taking? Authorizing Provider  aspirin-acetaminophen-caffeine (EXCEDRIN MIGRAINE) [phone] MG tablet Take by mouth every 6 (six) hours as needed for headache.   Yes [provider]  famotidine (PEPCID) 20 MG tablet Take 1 tablet (20 mg total) by mouth 2 (two) times daily. 07/31/22  Yes Deirdre Evener, MD  fexofenadine [facility] ALLERGY) 180 MG tablet Take 1-2 tablets twice a day for rash/hives/itching. 09/26/22  Yes Ellamae Sia, DO  ibuprofen (ADVIL) 800 MG tablet Take 1 tablet (800 mg total) by mouth every 8 (eight) hours as needed. 12/26/22  Yes [place], Coralee North, NP  montelukast (SINGULAIR) 10 MG tablet Take 1 tablet (10 mg total) by mouth at bedtime. 09/26/22  Yes Ellamae Sia, DO  nortriptyline (PAMELOR) 50 MG capsule Take 1 capsule (50 mg total) by mouth at bedtime. 05/02/23  Yes Drema Dallas,  DO  XOLAIR 300 MG/2ML injection  05/01/23  Yes [provider]    Allergies as of 08/11/2023 - Review Complete 08/05/2023  Allergen Reaction Noted   Ginger Hives 10/02/2016   Amoxil [amoxicillin] Hives 11/17/2019    Family History  Problem Relation Age of Onset   Hypertension Mother    Colon cancer Mother 18   Liver cancer Mother    Asthma Father    Allergic rhinitis Father    Diabetes Father        DM Type 2   Hypercholesterolemia Father    Ovarian cancer Maternal Aunt 40   Bone cancer Maternal Uncle 50   Breast cancer Paternal Aunt 88   Pancreatic cancer Maternal Grandmother 60   Eczema Daughter    Breast cancer Cousin 72   Urticaria Neg Hx     Social History   Socioeconomic History   Marital status: Single    Spouse name: Not on file   Number of children: Not on file   Years of education: Not on file   Highest education level: Not on file  Occupational History   Not on file  Tobacco Use   Smoking status: Never   Smokeless tobacco: Never  Vaping Use   Vaping status: Never Used  Substance and Sexual Activity   Alcohol use: Yes    Comment: occ.   Drug use: No   Sexual activity: Yes    Birth control/protection: Surgical  Comment: tubal ligation  Other Topics Concern   Not on file  Social History Narrative   Single.   4 children.   Works as a Public house manager.   Enjoys spending time with family and friends.       Right handed   Social Drivers of Corporate investment banker Strain: Not on file  Food Insecurity: Not on file  Transportation Needs: Not on file  Physical Activity: Not on file  Stress: Not on file  Social Connections: Not on file  Intimate Partner Violence: Not on file    Review of Systems: See HPI, otherwise negative ROS  Physical Exam: BP 128/86   Pulse 75   Temp (!) 96.8 F (36 C) (Temporal)   Resp 18   Ht 4\' 9"  (1.448 m)   Wt 56.9 kg   LMP 06/10/2021 (Approximate) Comment: no periods for two years 08/28/23  SpO2  100%   BMI 27.14 kg/m  General:   Alert,  pleasant and cooperative in NAD Head:  Normocephalic and atraumatic. Neck:  Supple; no masses or thyromegaly. Lungs:  Clear throughout to auscultation.    Heart:  Regular rate and rhythm. Abdomen:  Soft, nontender and nondistended. Normal bowel sounds, without guarding, and without rebound.   Neurologic:  Alert and  oriented x4;  grossly normal neurologically.  Impression/Plan: Cassandra Pham is now here to undergo a screening colonoscopy.  Risks, benefits, and alternatives regarding colonoscopy have been reviewed with the patient.  Questions have been answered.  All parties agreeable.

## 2023-08-28 NOTE — Op Note (Signed)
 [facility] Gastroenterology Patient Name: Cassandra Pham Procedure Date: 08/28/2023 10:06 AM MRN: [id number] Account #: [id number] Date of Birth: 03/25/1976 Admit Type: Outpatient Age: 48 Room: [facility] ENDO ROOM 4 Gender: Female Note Status: Finalized Instrument Name: Prentice Docker [id number] Procedure:             Colonoscopy Indications:           Screening for colorectal malignant neoplasm Providers:             [name] MD, MD Referring MD:          Ilona Sorrel. Copland (Referring MD) Medicines:             Propofol per Anesthesia Complications:         No immediate complications. Procedure:             Pre-Anesthesia Assessment:                        - Prior to the procedure, a History and Physical was                         performed, and patient medications and allergies were                         reviewed. The patient's tolerance of previous                         anesthesia was also reviewed. The risks and benefits                         of the procedure and the sedation options and risks                         were discussed with the patient. All questions were                         answered, and informed consent was obtained. Prior                         Anticoagulants: The patient has taken no anticoagulant                         or antiplatelet agents. ASA Grade Assessment: II - A                         patient with mild systemic disease. After reviewing                         the risks and benefits, the patient was deemed in                         satisfactory condition to undergo the procedure.                        After obtaining informed consent, the colonoscope was                         passed under direct vision. Throughout the procedure,  the patient's blood pressure, pulse, and oxygen                         saturations were monitored continuously. The                         Colonoscope was introduced through  the anus and                         advanced to the the cecum, identified by appendiceal                         orifice and ileocecal valve. The colonoscopy was                         performed without difficulty. The patient tolerated                         the procedure well. The quality of the bowel                         preparation was excellent. Findings:      The perianal and digital rectal examinations were normal.      A 3 mm polyp was found in the sigmoid colon. The polyp was sessile. The       polyp was removed with a cold biopsy forceps. Resection and retrieval       were complete.      A few small-mouthed diverticula were found in the entire colon. Impression:            - One 3 mm polyp in the sigmoid colon, removed with a                         cold biopsy forceps. Resected and retrieved.                        - Diverticulosis in the entire examined colon. Recommendation:        - Discharge patient to home.                        - Resume previous diet.                        - Continue present medications.                        - Await pathology results.                        - If the pathology report reveals adenomatous tissue,                         then repeat the colonoscopy for surveillance in 7                         years. Procedure Code(s):     --- Professional ---                        [phone], Colonoscopy, flexible; with biopsy, single or  multiple Diagnosis Code(s):     --- Professional ---                        Z12.11, Encounter for screening for malignant neoplasm                         of colon                        D12.5, Benign neoplasm of sigmoid colon CPT copyright 2022 American Medical Association. All rights reserved. The codes documented in this report are preliminary and upon coder review may  be revised to meet current compliance requirements. [name] MD, MD 08/28/2023 10:32:52 AM This report has been  signed electronically. Number of Addenda: 0 Note Initiated On: 08/28/2023 10:06 AM Scope Withdrawal Time: 0 hours 6 minutes 45 seconds  Total Procedure Duration: 0 hours 10 minutes 55 seconds  Estimated Blood Loss:  Estimated blood loss: none.      [facility]

## 2023-08-28 NOTE — Anesthesia Postprocedure Evaluation (Signed)
 Anesthesia Post Note  Patient: Cassandra Pham  Procedure(s) Performed: COLONOSCOPY POLYPECTOMY POLYPECTOMY, CERVIX  Patient location during evaluation: Endoscopy Anesthesia Type: General Level of consciousness: awake and alert Pain management: pain level controlled Vital Signs Assessment: post-procedure vital signs reviewed and stable Respiratory status: spontaneous breathing, nonlabored ventilation, respiratory function stable and patient connected to nasal cannula oxygen Cardiovascular status: blood pressure returned to baseline and stable Postop Assessment: no apparent nausea or vomiting Anesthetic complications: no   No notable events documented.   Last Vitals:  Vitals:   08/28/23 1034 08/28/23 1044  BP: (!) 90/49 106/67  Pulse: 69 72  Resp: (!) 22   Temp: (!) 36.1 C   SpO2: 100% 100%    Last Pain:  Vitals:   08/28/23 1044  TempSrc:   PainSc: 0-No pain                 Louie Boston

## 2023-08-29 ENCOUNTER — Encounter: Payer: Self-pay | Admitting: Gastroenterology

## 2023-08-29 LAB — SURGICAL PATHOLOGY

## 2023-09-02 ENCOUNTER — Encounter: Payer: Self-pay | Admitting: Gastroenterology

## 2023-09-08 ENCOUNTER — Encounter: Payer: Self-pay | Admitting: Adult Health

## 2023-09-08 ENCOUNTER — Ambulatory Visit (INDEPENDENT_AMBULATORY_CARE_PROVIDER_SITE_OTHER): Payer: Self-pay | Admitting: Adult Health

## 2023-09-08 ENCOUNTER — Other Ambulatory Visit: Payer: Self-pay

## 2023-09-08 VITALS — BP 119/69 | HR 80 | Temp 96.6°F | Ht <= 58 in | Wt 127.0 lb

## 2023-09-08 DIAGNOSIS — R399 Unspecified symptoms and signs involving the genitourinary system: Secondary | ICD-10-CM

## 2023-09-08 DIAGNOSIS — N3090 Cystitis, unspecified without hematuria: Secondary | ICD-10-CM

## 2023-09-08 DIAGNOSIS — R52 Pain, unspecified: Secondary | ICD-10-CM

## 2023-09-08 DIAGNOSIS — R300 Dysuria: Secondary | ICD-10-CM

## 2023-09-08 LAB — POCT URINALYSIS DIPSTICK (MANUAL)
Nitrite, UA: POSITIVE — AB
Poct Bilirubin: NEGATIVE
Poct Blood: 250 — AB
Poct Glucose: NORMAL mg/dL
Poct Ketones: NEGATIVE
Poct Urobilinogen: NORMAL mg/dL
Spec Grav, UA: 1.025 (ref 1.010–1.025)
pH, UA: 6 (ref 5.0–8.0)

## 2023-09-08 MED ORDER — CEPHALEXIN 500 MG PO CAPS
500.0000 mg | ORAL_CAPSULE | Freq: Two times a day (BID) | ORAL | 0 refills | Status: DC
Start: 2023-09-08 — End: 2024-02-03

## 2023-09-08 MED ORDER — IBUPROFEN 800 MG PO TABS: 800.0000 mg | ORAL_TABLET | Freq: Three times a day (TID) | ORAL | 0 refills | Status: DC | PRN

## 2023-09-08 NOTE — Addendum Note (Signed)
 Addended by: Sheria Dills on: 09/08/2023 11:56 AM   Modules accepted: Orders

## 2023-09-08 NOTE — Progress Notes (Signed)
 [facility] [address] [place], [place] [address]   Office Visit Note  Patient Name: Cassandra Pham Date of Birth [id number]  Medical Record number [id number]  Date of Service: 09/08/2023  Chief Complaint  Patient presents with   Acute Visit    Patient c/o having an odor to her urine, pressure when urinating, and blood in her urine. Symptoms began yesterday. She has not taken any OTC meds for symptoms.     HPI Pt is here for a sick visit. She reports urinary symptoms have started yesterday. She describes dysuria, and hesitancy.  She leaves for [place] in 2 days.    Current Medication:  Outpatient Encounter Medications as of 09/08/2023  Medication Sig   aspirin-acetaminophen-caffeine (EXCEDRIN MIGRAINE) 250-250-65 MG tablet Take by mouth every 6 (six) hours as needed for headache.   cephALEXin (KEFLEX) 500 MG capsule Take 1 capsule (500 mg total) by mouth 2 (two) times daily.   famotidine (PEPCID) 20 MG tablet Take 1 tablet (20 mg total) by mouth 2 (two) times daily.   fexofenadine (ALLEGRA ALLERGY) 180 MG tablet Take 1-2 tablets twice a day for rash/hives/itching.   ibuprofen (ADVIL) 800 MG tablet Take 1 tablet (800 mg total) by mouth every 8 (eight) hours as needed.   montelukast (SINGULAIR) 10 MG tablet Take 1 tablet (10 mg total) by mouth at bedtime.   XOLAIR 300 MG/2ML injection every 30 (thirty) days.   nortriptyline (PAMELOR) 50 MG capsule Take 1 capsule (50 mg total) by mouth at bedtime. (Patient not taking: Reported on 09/08/2023)   No facility-administered encounter medications on file as of 09/08/2023.      Medical History: Past Medical History:  Diagnosis Date   Anxiety and depression    Asthma    Bacterial vaginosis    BRCA negative 02/2020   MyRisk neg except NTHL1 VUS; IBIS=7.4%/riskscore=7.2%   Family history of ovarian cancer 02/2019   MyRisk testing declined   Family history of pancreatic cancer    Medical history non-contributory    Migraine     Urticaria    UTI (urinary tract infection)      Vital Signs: BP 119/69   Pulse 80   Temp (!) 96.6 F (35.9 C)   Ht 4\' 9"  (1.448 m)   Wt 127 lb (57.6 kg)   SpO2 99%   BMI 27.48 kg/m    Review of Systems  Constitutional:  Negative for chills, fatigue and fever.  Genitourinary:  Positive for dysuria and frequency. Negative for hematuria.    Physical Exam Constitutional:      Appearance: Normal appearance.  Abdominal:     Tenderness: There is no abdominal tenderness. There is no right CVA tenderness or left CVA tenderness.  Neurological:     Mental Status: She is alert.    Assessment/Plan: 1. Cystitis (Primary) Take Keflex every 12 hours (twice a day) with food x 7d; Finish all antibiotics. Drink plenty of water. Avoid or limit alcohol and caffeine, which may make symptoms worse. You may take an over-the-counter pain reliever (i.e., AZO urinary pain relief, Tylenol) as needed for pain next 1-2 days. Send secure message to provider or schedule return appointment as needed for new/worsening symptoms (such as fever or abdominal pain) if your symptoms are not improving after 2-3 days taking antibiotics or if your symptoms do not completely resolve following antibiotics.   2. UTI symptoms - POCT Urinalysis Dip Manual     General Counseling: Reyes verbalizes understanding of the findings  of todays visit and agrees with plan of treatment. I have discussed any further diagnostic evaluation that may be needed or ordered today. We also reviewed her medications today. she has been encouraged to call the office with any questions or concerns that should arise related to todays visit.   Orders Placed This Encounter  Procedures   POCT Urinalysis Dip Manual    Meds ordered this encounter  Medications   cephALEXin (KEFLEX) 500 MG capsule    Sig: Take 1 capsule (500 mg total) by mouth 2 (two) times daily.    Dispense:  14 capsule    Refill:  0    Time spent:15  Minutes    Sheria Dills AGNP-C Nurse Practitioner

## 2024-02-03 ENCOUNTER — Ambulatory Visit (INDEPENDENT_AMBULATORY_CARE_PROVIDER_SITE_OTHER): Payer: Self-pay | Admitting: Medical

## 2024-02-03 ENCOUNTER — Ambulatory Visit: Admitting: Dermatology

## 2024-02-03 ENCOUNTER — Encounter: Payer: Self-pay | Admitting: Medical

## 2024-02-03 VITALS — BP 116/84 | HR 91 | Temp 99.7°F | Wt 128.0 lb

## 2024-02-03 DIAGNOSIS — M791 Myalgia, unspecified site: Secondary | ICD-10-CM

## 2024-02-03 DIAGNOSIS — J029 Acute pharyngitis, unspecified: Secondary | ICD-10-CM

## 2024-02-03 DIAGNOSIS — Z20822 Contact with and (suspected) exposure to covid-19: Secondary | ICD-10-CM

## 2024-02-03 DIAGNOSIS — R051 Acute cough: Secondary | ICD-10-CM

## 2024-02-03 LAB — POC COVID19 BINAXNOW: SARS Coronavirus 2 Ag: NEGATIVE

## 2024-02-03 MED ORDER — IBUPROFEN 800 MG PO TABS
800.0000 mg | ORAL_TABLET | Freq: Three times a day (TID) | ORAL | 0 refills | Status: AC | PRN
Start: 2024-02-03 — End: ?

## 2024-02-03 NOTE — Patient Instructions (Addendum)
-   I recommend retesting for COVID-19 with an at-home test in 48 hours. -Continue to limit contact with others, and continue wearing a mask when you must be in public places. -If you develop a fever (100.4 or above), you should not return to work until the fever has resolved for at least 24 hours without taking fever reducing medicine (i.e. ibuprofen , Tylenol ). - Make sure to take ibuprofen  with food.  -Rest and stay well hydrated (by drinking water and other liquids).  -Take over-the-counter medicines (i.e. Dayquil, Nyquil) to help relieve your symptoms. -For your sore throat/cough, use cough drops/throat lozenges, gargle warm salt water and/or drink warm liquids (like tea with honey). -Send message to provider, schedule return visit or visit your primary care/urgent care provider as needed for new/worsening symptoms or if symptoms do not improve as discussed with recommended treatment over the next 5 to 7 days.

## 2024-02-03 NOTE — Progress Notes (Signed)
 [facility] [address] [place], [place] [address]   Office Visit Note  Patient Name: Cassandra Pham Date of Birth [id number]  Medical Record number [id number]  Date of Service: 02/03/2024  Chief Complaint  Patient presents with   Acute Visit    Sympotms started Sunday - did home covid test eith negative result. Wants to test again.      HPI 48 y.o. female presents with respiratory sx and recent COVID-19 exposure.  Noted throat irritation 5 days ago. Myalgias began 4 days ago and have continued. Sore throat some worsened, burning. Nasal congestion briefly yesterday, now resolved. No rhinorrhea. Has a dry cough. Has had some chills and sweats. Has not checked temp but has not suspected fever. No HA. No nausea, vomiting or diarrhea.   Granddaughter was dx with COVID-19 last week, has been taking care of her while sick.  Pt has had COVID twice in past, recalls some similar sx. No recent COVID booster.  Has taken Elderberry and Ibuprofen . Would like refill of Ibuprofen , states lower doses do not work well for her, understands need to take sparingly and with food.   Has mild asthma, uses albuterol  as needed. No worsening asthma sx.  Had negative at-home COVID-19 test yesterday, would like to be retested today.  Works as Public house manager on campus.   Current Medication:  Outpatient Encounter Medications as of 02/03/2024  Medication Sig   famotidine  (PEPCID ) 20 MG tablet Take 1 tablet (20 mg total) by mouth 2 (two) times daily.   ibuprofen  (ADVIL ) 800 MG tablet Take 1 tablet (800 mg total) by mouth every 8 (eight) hours as needed.   XOLAIR  300 MG/2ML injection every 30 (thirty) days.   aspirin-acetaminophen -caffeine (EXCEDRIN MIGRAINE) 250-250-65 MG tablet Take by mouth every 6 (six) hours as needed for headache.   fexofenadine  (ALLEGRA  ALLERGY ) 180 MG tablet Take 1-2 tablets twice a day for rash/hives/itching. (Patient not taking: Reported on 02/03/2024)    montelukast  (SINGULAIR ) 10 MG tablet Take 1 tablet (10 mg total) by mouth at bedtime. (Patient not taking: Reported on 02/03/2024)   nortriptyline  (PAMELOR ) 50 MG capsule Take 1 capsule (50 mg total) by mouth at bedtime. (Patient not taking: Reported on 09/08/2023)   [DISCONTINUED] cephALEXin  (KEFLEX ) 500 MG capsule Take 1 capsule (500 mg total) by mouth 2 (two) times daily. (Patient not taking: Reported on 02/03/2024)   No facility-administered encounter medications on file as of 02/03/2024.      Medical History: Past Medical History:  Diagnosis Date   Anxiety and depression    Asthma    Bacterial vaginosis    BRCA negative 02/2020   MyRisk neg except NTHL1 VUS; IBIS=7.4%/riskscore=7.2%   Family history of ovarian cancer 02/2019   MyRisk testing declined   Family history of pancreatic cancer    Medical history non-contributory    Migraine    Urticaria    UTI (urinary tract infection)      Vital Signs: BP 116/84   Pulse 91   Temp 99.7 F (37.6 C) (Tympanic)   SpO2 98%    Review of Systems  Constitutional:  Positive for chills, fatigue and fever.  HENT:  Positive for congestion (briefly, now resolved) and sore throat. Negative for rhinorrhea and trouble swallowing.   Respiratory:  Positive for cough. Negative for shortness of breath.   Gastrointestinal:  Negative for diarrhea, nausea and vomiting.  Musculoskeletal:  Positive for myalgias. Negative for neck stiffness.  Neurological:  Negative for headaches.  Physical Exam Vitals reviewed.  Constitutional:      General: She is not in acute distress.    Appearance: She is not ill-appearing.     Comments: Tired appearing  HENT:     Head: Normocephalic.     Right Ear: Ear canal and external ear normal.     Left Ear: Ear canal and external ear normal.     Ears:     Comments: TMs dull bilaterally    Nose: Mucosal edema present. No congestion or rhinorrhea.     Mouth/Throat:     Mouth: Mucous membranes are moist. No oral  lesions.     Pharynx: Uvula midline. Posterior oropharyngeal erythema (mild) present. No pharyngeal swelling or uvula swelling.     Tonsils: No tonsillar exudate. 1+ on the right. 1+ on the left.     Comments: Tonsils not inflamed appearing Cardiovascular:     Rate and Rhythm: Normal rate and regular rhythm.     Heart sounds: No murmur heard.    No friction rub. No gallop.  Pulmonary:     Effort: Pulmonary effort is normal.     Breath sounds: Normal breath sounds. No wheezing, rhonchi or rales.  Musculoskeletal:     Cervical back: Neck supple. No rigidity.  Lymphadenopathy:     Cervical: Cervical adenopathy (small anterior nodes, slightly tender) present.  Neurological:     Mental Status: She is alert.     Results for orders placed or performed in visit on 02/03/24 (from the past 24 hours)  POC COVID-19     Status: Normal   Collection Time: 02/03/24  3:03 PM  Result Value Ref Range   SARS Coronavirus 2 Ag Negative Negative     Assessment/Plan: 1. Myalgia (Primary) 2. Pharyngitis, unspecified etiology 3. Acute cough 4. Exposure to COVID-19 virus POC COVID-19 antigen test performed, result negative.  Clinical findings most suggestive of a viral infection.  Still have clinical suspicion for COVID given close exposure to granddaughter and symptoms. Patient encouraged to remain cautious, continue limiting contacts and wearing mask when must be around others. Recommended retesting again with at-home COVID-19 antigen test in 48 hours.  Will refill Ibuprofen , advised to use only when necessary for severe pain and to take with food. Encouraged to continue supportive measures. Discussed OTC symptomatic treatment.  - ibuprofen  (ADVIL ) 800 MG tablet; Take 1 tablet (800 mg total) by mouth every 8 (eight) hours as needed.  Dispense: 30 tablet; Refill: 0 - POC COVID-19  Patient Instructions  - I recommend retesting for COVID-19 with an at-home test in 48 hours. -Continue to limit contact  with others, and continue wearing a mask when you must be in public places. -If you develop a fever (100.4 or above), you should not return to work until the fever has resolved for at least 24 hours without taking fever reducing medicine (i.e. ibuprofen , Tylenol ). - Make sure to take ibuprofen  with food.  -Rest and stay well hydrated (by drinking water and other liquids).  -Take over-the-counter medicines (i.e. Dayquil, Nyquil) to help relieve your symptoms. -For your sore throat/cough, use cough drops/throat lozenges, gargle warm salt water and/or drink warm liquids (like tea with honey). -Send message to provider, schedule return visit or visit your primary care/urgent care provider as needed for new/worsening symptoms or if symptoms do not improve as discussed with recommended treatment over the next 5 to 7 days.    General Counseling: jeimy bickert understanding of the findings of todays visit and plan of  treatment. she has been encouraged to call the office with any questions or concerns that should arise related to todays visit.    Time spent:20 Minutes    Joen Arts PA-C Physician Assistant

## 2024-02-05 ENCOUNTER — Other Ambulatory Visit: Payer: Self-pay

## 2024-02-05 DIAGNOSIS — Z111 Encounter for screening for respiratory tuberculosis: Secondary | ICD-10-CM

## 2024-02-08 LAB — QUANTIFERON-TB GOLD PLUS
QuantiFERON Mitogen Value: >10 [IU]/mL
QuantiFERON Nil Value: 0.14 [IU]/mL
QuantiFERON TB1 Ag Value: 0.16 [IU]/mL
QuantiFERON TB2 Ag Value: 0.18 [IU]/mL
QuantiFERON-TB Gold Plus: NEGATIVE

## 2024-03-01 ENCOUNTER — Other Ambulatory Visit: Payer: Self-pay

## 2024-03-01 ENCOUNTER — Ambulatory Visit: Payer: Self-pay | Admitting: Medical

## 2024-03-01 ENCOUNTER — Encounter: Payer: Self-pay | Admitting: Medical

## 2024-03-01 VITALS — BP 100/70 | HR 80 | Temp 96.9°F | Ht <= 58 in | Wt 127.0 lb

## 2024-03-01 DIAGNOSIS — N644 Mastodynia: Secondary | ICD-10-CM

## 2024-03-01 NOTE — Progress Notes (Unsigned)
 [facility] [address] [place], [place] [address]   Office Visit Note  Patient Name: Cassandra Pham Date of Birth [id number]  Medical Record number [id number]  Date of Service: 03/01/2024  Chief Complaint  Patient presents with   Breast Pain    Patient feels heaviness and pain in both breasts 2-3 times a week for the last several months. She is scheduled for a mammogram on 03/24/24. She does self breast exams and has not noticed any abnormalities.     HPI 48 y.o. female presents with bilateral breast discomfort/heaviness.  Has been occurring for few months, 2-3x/week, notes first thing in AM and lasts few hours. No swelling. No skin changes or nipple d/c. UTD on mammogram, last done 04/30/23. Family hx of breast cancer in aunt and cousin.  Does not wear bra to bed  Does regular breast self exams multiple times per month. No unusual findings, nontender.   Does not get menses S/P ablation. Does have some spotting. Gets some aches in back/legs.   No change in caffeine. Does not smoke or vape. Not uncomfortable enough to take medicine.    Current Medication:  Outpatient Encounter Medications as of 03/01/2024  Medication Sig   aspirin-acetaminophen -caffeine (EXCEDRIN MIGRAINE) 250-250-65 MG tablet Take by mouth every 6 (six) hours as needed for headache.   famotidine  (PEPCID ) 20 MG tablet Take 1 tablet (20 mg total) by mouth 2 (two) times daily.   ibuprofen  (ADVIL ) 800 MG tablet Take 1 tablet (800 mg total) by mouth every 8 (eight) hours as needed.   montelukast  (SINGULAIR ) 10 MG tablet Take 1 tablet (10 mg total) by mouth at bedtime. (Patient taking differently: Take 10 mg by mouth at bedtime as needed.)   nortriptyline  (PAMELOR ) 50 MG capsule Take 1 capsule (50 mg total) by mouth at bedtime.   XOLAIR  300 MG/2ML injection every 30 (thirty) days.   [DISCONTINUED] fexofenadine  (ALLEGRA  ALLERGY ) 180 MG tablet Take 1-2 tablets twice a day for rash/hives/itching.  (Patient not taking: Reported on 02/03/2024)   No facility-administered encounter medications on file as of 03/01/2024.      Medical History: Past Medical History:  Diagnosis Date   Anxiety and depression    Asthma    Bacterial vaginosis    BRCA negative 02/2020   MyRisk neg except NTHL1 VUS; IBIS=7.4%/riskscore=7.2%   Family history of ovarian cancer 02/2019   MyRisk testing declined   Family history of pancreatic cancer    Medical history non-contributory    Migraine    Urticaria    UTI (urinary tract infection)      Vital Signs: BP 100/70   Pulse 80   Temp (!) 96.9 F (36.1 C)   Ht 4' 9 (1.448 m)   Wt 127 lb (57.6 kg)   SpO2 99%   BMI 27.48 kg/m    Review of Systems  Physical Exam Normal breast exam, pt reassured.   Assessment/Plan:   General Counseling: Malachi verbalizes understanding of the findings of todays visit and plan of treatment. she has been encouraged to call the office with any questions or concerns that should arise related to todays visit.    Time spent:*** Minutes    Joen Arts PA-C Physician Assistant

## 2024-03-02 ENCOUNTER — Encounter: Payer: Self-pay | Admitting: Medical

## 2024-03-02 NOTE — Patient Instructions (Addendum)
-  Limit caffeine. -Wear a supportive bra. -Continue regular mammogram screenings. -Schedule return visit for new lumps, skin changes, inverted nipple or nipple discharge.

## 2024-03-10 ENCOUNTER — Ambulatory Visit

## 2024-03-10 DIAGNOSIS — L508 Other urticaria: Secondary | ICD-10-CM | POA: Diagnosis not present

## 2024-03-10 DIAGNOSIS — L509 Urticaria, unspecified: Secondary | ICD-10-CM

## 2024-03-10 MED ORDER — XOLAIR 300 MG/2ML ~~LOC~~ SOAJ: 600.0000 mg | SUBCUTANEOUS | 1 refills | Status: AC

## 2024-03-10 MED ORDER — HYDROXYZINE HCL 10 MG PO TABS
10.0000 mg | ORAL_TABLET | Freq: Every evening | ORAL | 0 refills | Status: AC | PRN
Start: 2024-03-10 — End: 2024-04-09

## 2024-03-10 NOTE — Patient Instructions (Signed)

## 2024-03-10 NOTE — Progress Notes (Signed)
    Subjective   Cassandra Pham is a 48 y.o. female who presents for the following: Follow up of Urticaria. Patient is established patient   Today patient reports: Improvement in symptoms with fewer breakthrough flares. Most recent flare was the last week of September but has since resolved. Patient is using Hydroxyzine  PRN and Xolair  injections monthly. Denies any side effects   Review of Systems:    No other skin or systemic complaints except as noted in HPI or Assessment and Plan.  The following portions of the chart were reviewed this encounter and updated as appropriate: medications, allergies, medical history  Relevant Medical History:  n/a   Objective  Well appearing patient in no apparent distress; mood and affect are within normal limits. 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 Condition is stable and at goal   Urticaria or hives is a pink to red patchy whelp- like rash of the skin that typically itches and it is the result of histamine release in the skin.   Hives may have multiple causes including stress, medications, infections, and systemic illness.  Sometimes there is a family history of chronic urticaria.   Physical urticarias may be caused by pressure (dermatographism), heat, sun, cold, vibration.  Insect bites can cause papular urticaria. It is often difficult to find the cause of generalized hives.  Statistically, 70% of the time a cause of generalized hives is not found.  Sometimes hives can spontaneously resolve. Other times hives can persist and when it does, and no cause is found, and it has been at least 6 weeks since started,  it is called chronic idiopathic urticaria. Antihistamines are the mainstay for treatment.  In severe cases Xolair  injections may be used.    Treatment Plan: Cont Hydroxyzine  10mg  1 po at bedtime as needed.  Cont Xolair  600mg  sq injections q month most recent injection 02/13/24.    Level of service outlined above   Procedures, orders, diagnosis for this visit:    There are no diagnoses linked to this encounter.  Return to clinic: Return in about 6 months (around 09/08/2024) for URTICARIA.  I, Emerick Ege, CMA am acting as scribe for Lauraine JAYSON Kanaris, MD.   Documentation: I have reviewed the above documentation for accuracy and completeness, and I agree with the above.  Lauraine JAYSON Kanaris, MD

## 2024-03-24 ENCOUNTER — Other Ambulatory Visit: Payer: Self-pay | Admitting: Obstetrics and Gynecology

## 2024-03-24 DIAGNOSIS — Z1231 Encounter for screening mammogram for malignant neoplasm of breast: Secondary | ICD-10-CM

## 2024-04-29 NOTE — Progress Notes (Deleted)
 NEUROLOGY FOLLOW UP OFFICE NOTE  Cassandra Pham [id number]  Assessment/Plan:  Migraine without aura, without status migrainosus, not intractable    Migraine prevention:  nortriptyline  50mg  at bedtime *** Migraine rescue: Excedrin Migraine.  Limit use of pain relievers to no more than 2 days out of week to prevent risk of rebound or medication-overuse headache. Keep headache diary Follow up 1 year       Subjective:  Cassandra Pham is a 48 year old right-handed female with asthma who follows up for migraines.   UPDATE: Doing well Intensity:  2-5/10 if treats immediately, otherwise 10/10 if severe Duration:  2 hours with Excedrin.   Frequency:  Twice a month (one is severe) Current NSAIDS/analgesics:  Excedrin Migraine Current triptans:  none Current ergotamine:  none Current anti-emetic:  none Current muscle relaxants:  none Current Antihypertensive medications:  none Current Antidepressant medications:  nortriptyline  50mg  QHS, sertraline Current Anticonvulsant medications:  none Current anti-CGRP:  none Current Vitamins/Herbal/Supplements:  none Current Antihistamines/Decongestants:  none Other therapy:  none Hormone/birth control:  none   Caffeine:  1 cup coffee daily Alcohol:  occasional Smoker:  no Diet:  Does not drink much water during winter.  Drinks Sprite.  Skips meals Exercise:  no Depression/Anxiety:  yes Other pain:  no Sleep hygiene:  varies   HISTORY:  Onset:  Off an on for several years.  More consistent around 48 years old, worse over past couple of months.  Got new prescription a couple of months ago and now vision is off Location:  both temples Quality:  pressure/sharp/pounding Intensity:  2-5/10 if treats immediately, otherwise 10/10 if severe Aura:  absent Prodrome:  absent Associated symptoms:  Photophobia, phonophobia.  Usually no nausea but rarely.  She denies associated vomiting, visual disturbance, unilateral numbness or  weakness. Duration:  2 hours.  Recently had one lasting a day. Frequency:  Once a week, severe occurring every other week.  Frequency of abortive medication: 2-3 days a week Triggers:  stress, hormone Relieving factors:  Excedrin Migraine, sleep Activity:  If severe, cannot function       Past NSAIDS/analgesics:  diclofenac  75mg , acetaminophen , ibuprofen  800mg , Toradol  shot (helps) Past abortive triptans:  sumatriptan  tab, rizatriptan   Past abortive ergotamine:  none Past muscle relaxants:  cyclobenzaprine , tizanidine  Past anti-emetic:  ondansetron  4mg , promethazine  Past antihypertensive medications:  none Past antidepressant medications:  none Past anticonvulsant medications:  none Past anti-CGRP:  none Past vitamins/Herbal/Supplements:  B12, D Past antihistamines/decongestants:  none Other past therapies:  none     Family history of headache:  no  PAST MEDICAL HISTORY: Past Medical History:  Diagnosis Date   Anxiety and depression    Asthma    Bacterial vaginosis    BRCA negative 02/2020   MyRisk neg except NTHL1 VUS; IBIS=7.4%/riskscore=7.2%   Family history of ovarian cancer 02/2019   MyRisk testing declined   Family history of pancreatic cancer    Medical history non-contributory    Migraine    Urticaria    UTI (urinary tract infection)     MEDICATIONS: Current Outpatient Medications on File Prior to Visit  Medication Sig Dispense Refill   aspirin-acetaminophen -caffeine (EXCEDRIN MIGRAINE) 250-250-65 MG tablet Take by mouth every 6 (six) hours as needed for headache.     famotidine  (PEPCID ) 20 MG tablet Take 1 tablet (20 mg total) by mouth 2 (two) times daily. (Patient not taking: Reported on 03/10/2024) 60 tablet 1   ibuprofen  (ADVIL ) 800 MG tablet Take 1 tablet (800 mg  total) by mouth every 8 (eight) hours as needed. 30 tablet 0   montelukast  (SINGULAIR ) 10 MG tablet Take 1 tablet (10 mg total) by mouth at bedtime. (Patient taking differently: Take 10 mg by  mouth at bedtime as needed.) 30 tablet 3   nortriptyline  (PAMELOR ) 50 MG capsule Take 1 capsule (50 mg total) by mouth at bedtime. 90 capsule 3   XOLAIR  300 MG/2ML injection Inject 600 mg into the skin every 30 (thirty) days. 12 mL 1   No current facility-administered medications on file prior to visit.    ALLERGIES: Allergies  Allergen Reactions   Ginger Hives   Amoxil [Amoxicillin] Hives    FAMILY HISTORY: Family History  Problem Relation Age of Onset   Hypertension Mother    Colon cancer Mother 68   Liver cancer Mother    Asthma Father    Allergic rhinitis Father    Diabetes Father        DM Type 2   Hypercholesterolemia Father    Ovarian cancer Maternal Aunt 40   Bone cancer Maternal Uncle 42   Breast cancer Paternal Aunt 31   Pancreatic cancer Maternal Grandmother 48   Eczema Daughter    Breast cancer Cousin 40   Urticaria Neg Hx       Objective:  *** General: No acute distress.  Patient appears ***-groomed.   Head:  Normocephalic/atraumatic Eyes:  Fundi examined but not visualized Neck: supple, no paraspinal tenderness, full range of motion Heart:  Regular rate and rhythm Neurological Exam: alert and oriented.  Speech fluent and not dysarthric, language intact.  CN II-XII intact. Bulk and tone normal, muscle strength 5/5 throughout.  Sensation to light touch intact.  Deep tendon reflexes 2+ throughout, toes downgoing.  Finger to nose testing intact.  Gait normal, Romberg negative.   Juliene Dunnings, DO  CC: ***

## 2024-04-30 ENCOUNTER — Encounter

## 2024-05-03 ENCOUNTER — Ambulatory Visit: Payer: BC Managed Care – PPO | Admitting: Neurology

## 2024-05-03 ENCOUNTER — Encounter: Payer: Self-pay | Admitting: Neurology

## 2024-05-05 ENCOUNTER — Ambulatory Visit
Admission: RE | Admit: 2024-05-05 | Discharge: 2024-05-05 | Disposition: A | Discharge status: Home / Self Care | Source: Ambulatory Visit | Attending: Obstetrics and Gynecology | Admitting: Obstetrics and Gynecology

## 2024-05-05 DIAGNOSIS — Z1231 Encounter for screening mammogram for malignant neoplasm of breast: Secondary | ICD-10-CM

## 2024-05-11 ENCOUNTER — Encounter: Payer: Self-pay | Admitting: Medical

## 2024-05-11 ENCOUNTER — Other Ambulatory Visit: Payer: Self-pay

## 2024-05-11 ENCOUNTER — Ambulatory Visit: Payer: Self-pay | Admitting: Medical

## 2024-05-11 VITALS — BP 100/70 | HR 90 | Temp 97.8°F | Ht <= 58 in | Wt 130.0 lb

## 2024-05-11 DIAGNOSIS — R10815 Periumbilic abdominal tenderness: Secondary | ICD-10-CM

## 2024-05-11 DIAGNOSIS — R350 Frequency of micturition: Secondary | ICD-10-CM

## 2024-05-11 DIAGNOSIS — R300 Dysuria: Secondary | ICD-10-CM

## 2024-05-11 LAB — POCT URINALYSIS DIPSTICK (MANUAL)
Leukocytes, UA: NEGATIVE
Nitrite, UA: NEGATIVE
Poct Bilirubin: NEGATIVE
Poct Blood: NEGATIVE
Poct Glucose: NORMAL mg/dL
Poct Ketones: NEGATIVE
Poct Protein: NEGATIVE mg/dL
Poct Urobilinogen: NORMAL mg/dL
Spec Grav, UA: 1.015 (ref 1.010–1.025)
pH, UA: 7 (ref 5.0–8.0)

## 2024-05-11 MED ORDER — NITROFURANTOIN MONOHYD MACRO 100 MG PO CAPS: 100.0000 mg | ORAL_CAPSULE | Freq: Two times a day (BID) | ORAL | 0 refills | Status: AC

## 2024-05-11 NOTE — Progress Notes (Signed)
 [facility] [address] [place], [place] [address]   Office Visit Note  Patient Name: Cassandra Pham Date of Birth [id number]  Medical Record number [id number]  Date of Service: 05/11/2024  Chief Complaint  Patient presents with   Acute Visit    Patient reports urinary frequency, strong odor in urine, and lower back pain since yesterday. She took Azo both days.     HPI Discussed the use of AI scribe software for clinical note transcription with the patient, who gave verbal consent to proceed.  History of Present Illness Cassandra Pham is a 48 year old female who presents with symptoms suggestive of a urinary tract infection (UTI). Also notes intermittent abdominal pain.  Urinary tract symptoms - Onset of symptoms yesterday - Low back pain, dysuria, and strong urine odor - Increased frequency and urgency of urination, passing only small amounts each time - No unusual vaginal discharge or odor currently; mild discharge a few days ago, not concerning - History of similar symptoms, last UTI occurred a couple of months ago - No relief with AZO pain reliever - Sexually active, uses condoms, typically urinates after intercourse - May not drink enough water and sometimes holds urine, believes this may contribute to recurrent UTIs  Abdominal pain - Intermittent pain superior to the umbilicus, described as deep, burning, and stretching - Pain is not constant; had been absent for months before recently returning - No radiation of pain - No associated nausea, vomiting, diarrhea, or changes in bowel habits  Constitutional symptoms - No recent weight loss, fever, or chills  Gynecological history - History of laparoscopic tubal ligation which included an incision at the umbilicus.   Current Medication:  Outpatient Encounter Medications as of 05/11/2024  Medication Sig   aspirin-acetaminophen -caffeine (EXCEDRIN MIGRAINE) 250-250-65 MG tablet Take by mouth  every 6 (six) hours as needed for headache.   ibuprofen  (ADVIL ) 800 MG tablet Take 1 tablet (800 mg total) by mouth every 8 (eight) hours as needed.   montelukast  (SINGULAIR ) 10 MG tablet Take 1 tablet (10 mg total) by mouth at bedtime.   nortriptyline  (PAMELOR ) 50 MG capsule Take 1 capsule (50 mg total) by mouth at bedtime. (Patient taking differently: Take 50 mg by mouth at bedtime as needed.)   XOLAIR  300 MG/2ML injection Inject 600 mg into the skin every 30 (thirty) days.   [DISCONTINUED] famotidine  (PEPCID ) 20 MG tablet Take 1 tablet (20 mg total) by mouth 2 (two) times daily. (Patient not taking: Reported on 03/10/2024)   No facility-administered encounter medications on file as of 05/11/2024.      Medical History: Past Medical History:  Diagnosis Date   Anxiety and depression    Asthma    Bacterial vaginosis    BRCA negative 02/2020   MyRisk neg except NTHL1 VUS; IBIS=7.4%/riskscore=7.2%   Family history of ovarian cancer 02/2019   MyRisk testing declined   Family history of pancreatic cancer    Medical history non-contributory    Migraine    Urticaria    UTI (urinary tract infection)      Vital Signs: BP 100/70   Pulse 90   Temp 97.8 F (36.6 C)   Ht 4' 9 (1.448 m)   Wt 130 lb (59 kg)   SpO2 98%   BMI 28.13 kg/m    Review of Systems See HPI  Physical Exam Vitals reviewed.  Constitutional:      General: She is not in acute distress.    Appearance:  She is not ill-appearing.  HENT:     Head: Normocephalic.  Cardiovascular:     Rate and Rhythm: Normal rate and regular rhythm.     Heart sounds: No murmur heard.    No friction rub. No gallop.  Pulmonary:     Effort: Pulmonary effort is normal.     Breath sounds: Normal breath sounds. No wheezing, rhonchi or rales.  Abdominal:     Palpations: Abdomen is soft. There is no mass.     Tenderness: There is abdominal tenderness (Mild, 1-2 inch superior to umbilicus in midline). There is no right CVA  tenderness, left CVA tenderness or guarding.     Comments: No suprapubic tenderness.  Musculoskeletal:     Cervical back: Neck supple.     Results for orders placed or performed in visit on 05/11/24 (from the past 24 hours)  POCT Urinalysis Dip Manual     Status: None   Collection Time: 05/11/24  2:09 PM  Result Value Ref Range   Spec Grav, UA 1.015 1.010 - 1.025   pH, UA 7.0 5.0 - 8.0   Leukocytes, UA Negative Negative   Nitrite, UA Negative Negative   Poct Protein Negative Negative, trace mg/dL   Poct Glucose Normal Normal mg/dL   Poct Ketones Negative Negative   Poct Urobilinogen Normal Normal mg/dL   Poct Bilirubin Negative Negative   Poct Blood Negative Negative, trace     Assessment/Plan: 1. Urinary frequency (Primary) 2. Dysuria  - POCT Urinalysis Dip Manual - nitrofurantoin , macrocrystal-monohydrate, (MACROBID ) 100 MG capsule; Take 1 capsule (100 mg total) by mouth 2 (two) times daily.  Dispense: 14 capsule; Refill: 0 - Urine Culture  3. Periumbilical abdominal tenderness without rebound tenderness  - US  Abdomen Limited; Future Assessment & Plan Acute urinary tract infection Symptoms suspicious for UTI and similar to previous UTIs per patient. Urinalysis within normal limits, will send urine for culture.  - Prescribed nitrofurantoin  100 mg twice daily for 7 days. - Advised increased water intake, limit caffeine and alcohol. - Instructed to discontinue AZO if GI upset occurs and ineffective for relief of her symptoms. - Sent urine culture for analysis. - Patient denies concern for STI.  Abdominal pain of unclear etiology Intermittent supraumbilical pain, possibly due to scar tissue or adhesions. No palpable defect/mass on exam, though patient is tender. Small hermia also possible. Imaging deferred due to travel. - Ordered abdominal ultrasound post-travel in January. - Advised to monitor symptoms and report if pain worsens or new symptoms develop.     General  Counseling: kirsti mcalpine understanding of the findings of todays visit and plan of treatment. she has been encouraged to call the office with any questions or concerns that should arise related to todays visit.   Joen Arts PA-C Physician Assistant

## 2024-05-11 NOTE — Patient Instructions (Addendum)
-  Take antibiotic every 12 hours (twice a day) with food; Finish all antibiotics. -Drink plenty of water. Avoid or limit alcohol and caffeine, which may make symptoms worse. -You may take an over-the-counter pain reliever (i.e., AZO urinary pain relief, Tylenol ) as needed for pain next 1-2 days. If the AZO is not effective for you, you do not need to take it. -You will receive a MyChart message notifying you of your lab results when they are available.  -Send secure message to provider or schedule return appointment as needed for new/worsening symptoms (such as fever or abdominal pain), if your symptoms are not improving after 2-3 days taking antibiotics or if your symptoms do not completely resolve following antibiotics.  -You should receive a call from [facility] radiology to schedule an abdominal ultrasound.  -Visit [email or web address] to schedule an appointment with a primary care provider.

## 2024-05-13 LAB — URINE CULTURE: Organism ID, Bacteria: NO GROWTH

## 2024-06-25 NOTE — Progress Notes (Unsigned)
 "  Virtual Visit via Video Note:   Consent was obtained for video visit:  Yes.   Answered questions that patient had about telehealth interaction:  Yes.   I discussed the limitations, risks, security and privacy concerns of performing an evaluation and management service by telemedicine. I also discussed with the patient that there may be a patient responsible charge related to this service. The patient expressed understanding and agreed to proceed.  Pt location: Home Physician Location: office Name of referring provider:  Copland, Alicia B, PA-C I connected with Cassandra Pham at patients initiation/request on 06/28/2024 at  4:10 PM EST by video enabled telemedicine application and verified that I am speaking with the correct person using two identifiers. Pt MRN:  [id number] Pt DOB:  08-14-1975 Video Participants:  Cassandra Pham  Assessment/Plan:    Migraine without aura, without status migrainosus, not intractable    Migraine prevention:  nortriptyline  50mg  at bedtime  Migraine rescue: Excedrin Migraine.  Limit use of pain relievers to no more than 2 days out of week to prevent risk of rebound or medication-overuse headache. Keep headache diary Follow up 1 year       Subjective:  Cassandra Pham is a 49 year old right-handed female with asthma who follows up for migraines.   UPDATE: Doing well Intensity:  2-5/10 if treats immediately, otherwise 10/10 if severe Duration:  2 hours with Excedrin.   Frequency:  once a month Current NSAIDS/analgesics:  Excedrin Migraine Current triptans:  none Current ergotamine:  none Current anti-emetic:  none Current muscle relaxants:  none Current Antihypertensive medications:  none Current Antidepressant medications:  nortriptyline  50mg  QHS, sertraline Current Anticonvulsant medications:  none Current anti-CGRP:  none Current Vitamins/Herbal/Supplements:  none Current Antihistamines/Decongestants:  none Other therapy:   none Hormone/birth control:  none   Caffeine:  1 cup coffee daily Alcohol:  occasional Smoker:  no Diet:  Does not drink much water during winter.  Drinks Sprite.  Skips meals Exercise:  no Depression/Anxiety:  yes Other pain:  no Sleep hygiene:  varies   HISTORY:  Onset:  Off an on for several years.  More consistent around 49 years old, worse over past couple of months.  Got new prescription a couple of months ago and now vision is off Location:  both temples Quality:  pressure/sharp/pounding Intensity:  2-5/10 if treats immediately, otherwise 10/10 if severe Aura:  absent Prodrome:  absent Associated symptoms:  Photophobia, phonophobia.  Usually no nausea but rarely.  She denies associated vomiting, visual disturbance, unilateral numbness or weakness. Duration:  2 hours.  Recently had one lasting a day. Frequency:  Once a week, severe occurring every other week.  Frequency of abortive medication: 2-3 days a week Triggers:  stress, hormone Relieving factors:  Excedrin Migraine, sleep Activity:  If severe, cannot function       Past NSAIDS/analgesics:  diclofenac  75mg , acetaminophen , ibuprofen  800mg , Toradol  shot (helps) Past abortive triptans:  sumatriptan  tab, rizatriptan   Past abortive ergotamine:  none Past muscle relaxants:  cyclobenzaprine , tizanidine  Past anti-emetic:  ondansetron  4mg , promethazine  Past antihypertensive medications:  none Past antidepressant medications:  none Past anticonvulsant medications:  none Past anti-CGRP:  none Past vitamins/Herbal/Supplements:  B12, D Past antihistamines/decongestants:  none Other past therapies:  none     Family history of headache:  no  Past Medical History: Past Medical History:  Diagnosis Date   Anxiety and depression    Asthma    Bacterial vaginosis    BRCA negative 02/2020  MyRisk neg except NTHL1 VUS; IBIS=7.4%/riskscore=7.2%   Family history of ovarian cancer 02/2019   MyRisk testing declined   Family  history of pancreatic cancer    Medical history non-contributory    Migraine    Urticaria    UTI (urinary tract infection)     Medications: Outpatient Encounter Medications as of 06/28/2024  Medication Sig   aspirin-acetaminophen -caffeine (EXCEDRIN MIGRAINE) 250-250-65 MG tablet Take by mouth every 6 (six) hours as needed for headache.   ibuprofen  (ADVIL ) 800 MG tablet Take 1 tablet (800 mg total) by mouth every 8 (eight) hours as needed.   montelukast  (SINGULAIR ) 10 MG tablet Take 1 tablet (10 mg total) by mouth at bedtime.   nitrofurantoin , macrocrystal-monohydrate, (MACROBID ) 100 MG capsule Take 1 capsule (100 mg total) by mouth 2 (two) times daily.   nortriptyline  (PAMELOR ) 50 MG capsule Take 1 capsule (50 mg total) by mouth at bedtime. (Patient taking differently: Take 50 mg by mouth at bedtime as needed.)   XOLAIR  300 MG/2ML injection Inject 600 mg into the skin every 30 (thirty) days.   No facility-administered encounter medications on file as of 06/28/2024.    Allergies: Allergies[1]  Family History: Family History  Problem Relation Age of Onset   Hypertension Mother    Colon cancer Mother 68   Liver cancer Mother    Asthma Father    Allergic rhinitis Father    Diabetes Father        DM Type 2   Hypercholesterolemia Father    Ovarian cancer Maternal Aunt 40   Bone cancer Maternal Uncle 9   Breast cancer Paternal Aunt 81   Pancreatic cancer Maternal Grandmother 10   Eczema Daughter    Breast cancer Cousin 40   Urticaria Neg Hx     Observations/Objective:   No acute distress.  Alert and oriented.  Speech fluent and not dysarthric.  Language intact.  Eyes orthophoric on primary gaze.  Face symmetric.   Follow up Instructions:      -I discussed the assessment and treatment plan with the patient. The patient was provided an opportunity to ask questions and all were answered. The patient agreed with the plan and demonstrated an understanding of the instructions.    The patient was advised to call back or seek an in-person evaluation if the symptoms worsen or if the condition fails to improve as anticipated.   Juliene Lamar Dunnings, DO   CC: Bernarda Copland, PA-C          [1]  Allergies Allergen Reactions   Ginger Hives   Amoxil [Amoxicillin] Hives   "

## 2024-06-28 ENCOUNTER — Encounter: Payer: Self-pay | Admitting: Neurology

## 2024-06-28 ENCOUNTER — Telehealth: Admitting: Neurology

## 2024-06-28 DIAGNOSIS — G43009 Migraine without aura, not intractable, without status migrainosus: Secondary | ICD-10-CM

## 2024-06-28 MED ORDER — NORTRIPTYLINE HCL 50 MG PO CAPS: 50.0000 mg | ORAL_CAPSULE | Freq: Every day | ORAL | 3 refills | Status: AC

## 2024-09-08 ENCOUNTER — Ambulatory Visit
